# Patient Record
Sex: Female | Born: 1960 | Race: Black or African American | Hispanic: No | Smoking: Former smoker
Health system: Southern US, Community
[De-identification: ages and names within clinical notes are randomized; demographics above are authoritative.]

## PROBLEM LIST (undated history)

## (undated) DIAGNOSIS — E669 Obesity, unspecified: Secondary | ICD-10-CM

## (undated) DIAGNOSIS — M2550 Pain in unspecified joint: Secondary | ICD-10-CM

## (undated) DIAGNOSIS — R7303 Prediabetes: Secondary | ICD-10-CM

## (undated) DIAGNOSIS — D219 Benign neoplasm of connective and other soft tissue, unspecified: Secondary | ICD-10-CM

## (undated) DIAGNOSIS — Z9289 Personal history of other medical treatment: Secondary | ICD-10-CM

## (undated) DIAGNOSIS — R0602 Shortness of breath: Secondary | ICD-10-CM

## (undated) DIAGNOSIS — E785 Hyperlipidemia, unspecified: Secondary | ICD-10-CM

## (undated) DIAGNOSIS — J3089 Other allergic rhinitis: Secondary | ICD-10-CM

## (undated) DIAGNOSIS — Z90710 Acquired absence of both cervix and uterus: Secondary | ICD-10-CM

## (undated) DIAGNOSIS — F419 Anxiety disorder, unspecified: Secondary | ICD-10-CM

## (undated) DIAGNOSIS — N950 Postmenopausal bleeding: Secondary | ICD-10-CM

## (undated) DIAGNOSIS — J302 Other seasonal allergic rhinitis: Secondary | ICD-10-CM

## (undated) DIAGNOSIS — R002 Palpitations: Secondary | ICD-10-CM

## (undated) DIAGNOSIS — I10 Essential (primary) hypertension: Secondary | ICD-10-CM

## (undated) HISTORY — DX: Other allergic rhinitis: J30.89

## (undated) HISTORY — DX: Prediabetes: R73.03

## (undated) HISTORY — DX: Obesity, unspecified: E66.9

## (undated) HISTORY — DX: Other seasonal allergic rhinitis: J30.2

## (undated) HISTORY — DX: Hyperlipidemia, unspecified: E78.5

## (undated) HISTORY — PX: WISDOM TOOTH EXTRACTION: SHX21

## (undated) HISTORY — DX: Personal history of other medical treatment: Z92.89

## (undated) HISTORY — DX: Pain in unspecified joint: M25.50

## (undated) HISTORY — DX: Anxiety disorder, unspecified: F41.9

## (undated) HISTORY — PX: TONSILLECTOMY: SUR1361

## (undated) HISTORY — PX: CARPAL TUNNEL RELEASE: SHX101

## (undated) HISTORY — DX: Essential (primary) hypertension: I10

## (undated) HISTORY — DX: Shortness of breath: R06.02

## (undated) HISTORY — DX: Acquired absence of both cervix and uterus: Z90.710

## (undated) HISTORY — PX: ADENOIDECTOMY: SUR15

---

## 1998-07-02 ENCOUNTER — Emergency Department (HOSPITAL_COMMUNITY): Admission: EM | Admit: 1998-07-02 | Discharge: 1998-07-03 | Payer: Self-pay | Admitting: Emergency Medicine

## 1998-07-03 ENCOUNTER — Encounter: Payer: Self-pay | Admitting: Emergency Medicine

## 2002-03-20 ENCOUNTER — Other Ambulatory Visit: Admission: RE | Admit: 2002-03-20 | Discharge: 2002-03-20 | Payer: Self-pay | Admitting: Obstetrics & Gynecology

## 2002-03-31 ENCOUNTER — Emergency Department (HOSPITAL_COMMUNITY): Admission: EM | Admit: 2002-03-31 | Discharge: 2002-04-01 | Payer: Self-pay | Admitting: *Deleted

## 2002-03-31 ENCOUNTER — Encounter: Payer: Self-pay | Admitting: *Deleted

## 2002-04-10 ENCOUNTER — Encounter: Payer: Self-pay | Admitting: Obstetrics and Gynecology

## 2002-04-10 ENCOUNTER — Encounter: Admission: RE | Admit: 2002-04-10 | Discharge: 2002-04-10 | Payer: Self-pay | Admitting: Obstetrics and Gynecology

## 2003-04-09 ENCOUNTER — Other Ambulatory Visit: Admission: RE | Admit: 2003-04-09 | Discharge: 2003-04-09 | Payer: Self-pay | Admitting: Obstetrics & Gynecology

## 2003-05-25 ENCOUNTER — Encounter: Admission: RE | Admit: 2003-05-25 | Discharge: 2003-05-25 | Payer: Self-pay | Admitting: Obstetrics and Gynecology

## 2004-02-03 ENCOUNTER — Emergency Department (HOSPITAL_COMMUNITY): Admission: EM | Admit: 2004-02-03 | Discharge: 2004-02-03 | Payer: Self-pay | Admitting: *Deleted

## 2004-02-05 ENCOUNTER — Emergency Department (HOSPITAL_COMMUNITY): Admission: EM | Admit: 2004-02-05 | Discharge: 2004-02-05 | Payer: Self-pay | Admitting: Emergency Medicine

## 2004-05-30 ENCOUNTER — Encounter: Admission: RE | Admit: 2004-05-30 | Discharge: 2004-05-30 | Payer: Self-pay | Admitting: Obstetrics and Gynecology

## 2005-03-17 ENCOUNTER — Emergency Department (HOSPITAL_COMMUNITY): Admission: EM | Admit: 2005-03-17 | Discharge: 2005-03-17 | Payer: Self-pay | Admitting: Emergency Medicine

## 2005-06-12 ENCOUNTER — Encounter: Admission: RE | Admit: 2005-06-12 | Discharge: 2005-06-12 | Payer: Self-pay | Admitting: Obstetrics and Gynecology

## 2005-08-08 ENCOUNTER — Ambulatory Visit (HOSPITAL_COMMUNITY): Admission: RE | Admit: 2005-08-08 | Discharge: 2005-08-08 | Payer: Self-pay | Admitting: Obstetrics and Gynecology

## 2006-03-13 ENCOUNTER — Emergency Department (HOSPITAL_COMMUNITY): Admission: EM | Admit: 2006-03-13 | Discharge: 2006-03-13 | Payer: Self-pay | Admitting: Emergency Medicine

## 2006-06-14 ENCOUNTER — Encounter: Admission: RE | Admit: 2006-06-14 | Discharge: 2006-06-14 | Payer: Self-pay | Admitting: Obstetrics and Gynecology

## 2006-12-22 ENCOUNTER — Emergency Department (HOSPITAL_COMMUNITY): Admission: EM | Admit: 2006-12-22 | Discharge: 2006-12-23 | Payer: Self-pay | Admitting: Emergency Medicine

## 2007-02-12 ENCOUNTER — Encounter: Admission: RE | Admit: 2007-02-12 | Discharge: 2007-02-12 | Payer: Self-pay | Admitting: Internal Medicine

## 2007-02-14 ENCOUNTER — Emergency Department (HOSPITAL_COMMUNITY): Admission: EM | Admit: 2007-02-14 | Discharge: 2007-02-14 | Payer: Self-pay | Admitting: *Deleted

## 2007-02-19 ENCOUNTER — Encounter (INDEPENDENT_AMBULATORY_CARE_PROVIDER_SITE_OTHER): Payer: Self-pay | Admitting: Interventional Radiology

## 2007-02-19 ENCOUNTER — Encounter: Admission: RE | Admit: 2007-02-19 | Discharge: 2007-02-19 | Payer: Self-pay | Admitting: Internal Medicine

## 2007-02-19 ENCOUNTER — Other Ambulatory Visit: Admission: RE | Admit: 2007-02-19 | Discharge: 2007-02-19 | Payer: Self-pay | Admitting: Interventional Radiology

## 2007-07-08 ENCOUNTER — Encounter: Admission: RE | Admit: 2007-07-08 | Discharge: 2007-07-08 | Payer: Self-pay | Admitting: Obstetrics and Gynecology

## 2007-08-19 ENCOUNTER — Encounter: Admission: RE | Admit: 2007-08-19 | Discharge: 2007-08-19 | Payer: Self-pay | Admitting: Internal Medicine

## 2008-08-17 ENCOUNTER — Encounter: Admission: RE | Admit: 2008-08-17 | Discharge: 2008-08-17 | Payer: Self-pay | Admitting: Obstetrics and Gynecology

## 2009-08-24 ENCOUNTER — Encounter: Admission: RE | Admit: 2009-08-24 | Discharge: 2009-08-24 | Payer: Self-pay | Admitting: Obstetrics and Gynecology

## 2010-07-25 ENCOUNTER — Other Ambulatory Visit: Payer: Self-pay | Admitting: Obstetrics and Gynecology

## 2010-07-25 DIAGNOSIS — Z1231 Encounter for screening mammogram for malignant neoplasm of breast: Secondary | ICD-10-CM

## 2010-08-28 ENCOUNTER — Other Ambulatory Visit: Payer: Self-pay | Admitting: Obstetrics & Gynecology

## 2010-08-28 ENCOUNTER — Ambulatory Visit
Admission: RE | Admit: 2010-08-28 | Discharge: 2010-08-28 | Disposition: A | Discharge status: Home / Self Care | Payer: BC Managed Care – PPO | Source: Ambulatory Visit | Attending: Obstetrics and Gynecology | Admitting: Obstetrics and Gynecology

## 2010-08-28 DIAGNOSIS — Z1231 Encounter for screening mammogram for malignant neoplasm of breast: Secondary | ICD-10-CM

## 2011-02-22 LAB — TSH: TSH: 0.787

## 2011-02-22 LAB — BASIC METABOLIC PANEL
BUN: 10
CO2: 26
Calcium: 9.1
Chloride: 106
Creatinine, Ser: 0.88
GFR calc Af Amer: >60
GFR calc non Af Amer: >60
Glucose, Bld: 118 — ABNORMAL HIGH
Potassium: 4.5
Sodium: 140

## 2011-02-22 LAB — CBC
HCT: 39.1
Hemoglobin: 12.8
MCHC: 32.6
MCV: 85.3
Platelets: 327
RBC: 4.59
RDW: 13.8
WBC: 7

## 2011-02-22 LAB — T4, FREE: Free T4: 1.58

## 2011-02-26 LAB — CBC
HCT: 39.5
Hemoglobin: 13.1
MCHC: 33.2
MCV: 85.3
Platelets: 314
RBC: 4.62
RDW: 14.7 — ABNORMAL HIGH
WBC: 7.7

## 2011-02-26 LAB — URINE MICROSCOPIC-ADD ON

## 2011-02-26 LAB — POCT PREGNANCY, URINE
Operator id: 277751
Preg Test, Ur: NEGATIVE

## 2011-02-26 LAB — POCT I-STAT CREATININE
Creatinine, Ser: 1
Operator id: 277751

## 2011-02-26 LAB — I-STAT 8, (EC8 V) (CONVERTED LAB)
BUN: 13
Bicarbonate: 26.9 — ABNORMAL HIGH
Chloride: 105
Glucose, Bld: 106 — ABNORMAL HIGH
HCT: 43
Hemoglobin: 14.6
Operator id: 277751
Potassium: 4.1
Sodium: 141
TCO2: 28
pCO2, Ven: 51.9 — ABNORMAL HIGH
pH, Ven: 7.323 — ABNORMAL HIGH

## 2011-02-26 LAB — DIFFERENTIAL
Basophils Absolute: 0
Basophils Relative: 0
Eosinophils Absolute: 0.3
Eosinophils Relative: 4
Lymphocytes Relative: 26
Lymphs Abs: 2
Monocytes Absolute: 0.9 — ABNORMAL HIGH
Monocytes Relative: 11
Neutro Abs: 4.5
Neutrophils Relative %: 58

## 2011-02-26 LAB — URINALYSIS, ROUTINE W REFLEX MICROSCOPIC
Bilirubin Urine: NEGATIVE
Glucose, UA: NEGATIVE
Ketones, ur: 15 — AB
Nitrite: NEGATIVE
Protein, ur: 100 — AB
Specific Gravity, Urine: 1.033 — ABNORMAL HIGH
Urobilinogen, UA: 1
pH: 6.5

## 2011-02-26 LAB — GC/CHLAMYDIA PROBE AMP, GENITAL
Chlamydia, DNA Probe: NEGATIVE
GC Probe Amp, Genital: NEGATIVE

## 2011-02-26 LAB — PREGNANCY, URINE: Preg Test, Ur: NEGATIVE

## 2011-03-18 ENCOUNTER — Emergency Department (HOSPITAL_COMMUNITY)
Admission: EM | Admit: 2011-03-18 | Discharge: 2011-03-18 | Disposition: A | Discharge status: Home / Self Care | Payer: Self-pay | Attending: Emergency Medicine | Admitting: Emergency Medicine

## 2011-03-18 ENCOUNTER — Encounter: Payer: Self-pay | Admitting: *Deleted

## 2011-03-18 ENCOUNTER — Other Ambulatory Visit: Payer: Self-pay

## 2011-03-18 DIAGNOSIS — R002 Palpitations: Secondary | ICD-10-CM | POA: Insufficient documentation

## 2011-03-18 DIAGNOSIS — F411 Generalized anxiety disorder: Secondary | ICD-10-CM | POA: Insufficient documentation

## 2011-03-18 HISTORY — DX: Palpitations: R00.2

## 2011-03-18 MED ORDER — METOPROLOL TARTRATE 25 MG PO TABS: 25.0000 mg | ORAL_TABLET | Freq: Two times a day (BID) | ORAL | Status: DC

## 2011-03-18 NOTE — ED Provider Notes (Signed)
History     CSN: [id number] Arrival date & time: 03/18/2011  7:12 AM   First MD Initiated Contact with Patient 03/18/11 0719      Chief Complaint  Patient presents with  . Palpitations    (Consider location/radiation/quality/duration/timing/severity/associated sxs/prior treatment) Patient is a 50 y.o. female presenting with palpitations.  Palpitations  Pertinent negatives include no diaphoresis, no numbness, no abdominal pain, no headaches, no back pain, no cough and no shortness of breath.   the patient is a 50 year old, female with a history of palpitations in the past.  She complains of increased palpitations and anxiety.  For the past few days.  She states that she has been eating increased amounts of chocolate recently.  She has tried to take an extra dose of her anxiolytic, but did not get any relief.  She was told that she could take an increased dose of her Toprol however, she has not done that.  She denies any other symptoms.  She denies specifically, nausea, vomiting, diarrhea, cough, fevers, chills.  She denies pain anywhere.  She does not drink caffeinated beverages.  She does not smoke.  The palpitations come and go and are frequent.  She denies lightheadedness with the palpitations.  She has a appointment with her cardiologist on November 27 concerning the symptoms.  Past Medical History  Diagnosis Date  . Palpitations     Past Surgical History  Procedure Date  . Carpal tunnel release     Family History  Problem Relation Age of Onset  . Diabetes Mother   . Coronary artery disease Father     History  Substance Use Topics  . Smoking status: Never Smoker   . Smokeless tobacco: Not on file  . Alcohol Use: No    OB History    Grav Para Term Preterm Abortions TAB SAB Ect Mult Living                  Review of Systems  Constitutional: Negative for chills and diaphoresis.  HENT: Negative for congestion and neck pain.   Eyes: Negative for redness.    Respiratory: Negative for cough, chest tightness and shortness of breath.   Cardiovascular: Positive for palpitations.  Gastrointestinal: Negative for abdominal pain.  Genitourinary: Negative for dysuria.  Musculoskeletal: Negative for back pain.  Skin: Negative for rash.  Neurological: Negative for light-headedness, numbness and headaches.  Psychiatric/Behavioral: Negative for confusion.    Allergies  Review of patient's allergies indicates not on file.    No current outpatient prescriptions on file.  BP 142/90  Pulse 103  Temp(Src) 98.8 F (37.1 C) (Oral)  Resp 18  Ht 5\' 3"  (1.6 m)  Wt 193 lb (87.544 kg)  BMI 34.19 kg/m2  SpO2 100%  Physical Exam  Constitutional: She is oriented to person, place, and time. She appears well-developed and well-nourished.  HENT:  Head: Normocephalic and atraumatic.  Eyes: Pupils are equal, round, and reactive to light.  Neck: Normal range of motion.  Cardiovascular: Normal rate, regular rhythm and normal heart sounds.   No murmur heard. Pulmonary/Chest: Effort normal and breath sounds normal. No respiratory distress. She has no wheezes. She has no rales.  Abdominal: Soft. She exhibits no distension and no mass. There is no tenderness. There is no rebound and no guarding.  Musculoskeletal: Normal range of motion. She exhibits no edema and no tenderness.  Neurological: She is alert and oriented to person, place, and time. No cranial nerve deficit.  Skin: Skin is warm and  dry. No rash noted. No erythema.  Psychiatric: She has a normal mood and affect. Her behavior is normal.    ED Course  Procedures (including critical care time)  Labs Reviewed - No data to display No results found.   No diagnosis found.  `ED ECG REPORT   Date: 03/18/2011  EKG Time: 08:05  Rate: 101  Rhythm: sinus tachycardia,    Axis: normal  Intervals:none  ST&T Change: none  Narrative Interpretation: sinus tachycardia            MDM   Sinus  tachycardia.  The heart rate varies from the 80s to 100 conduct her interview and evaluation.  palpitations no significant illness the patient is in no distress.   She knows that she can take an increased dosage of Toprol.  There is no indication for further testing in the emergency department.       Nicholes Stairs, MD 03/18/11 [phone]

## 2011-03-18 NOTE — ED Notes (Signed)
C/o palpitations. H/o same. Has had more chocolate recently. Feels nervous. Takes toprol PTA for the same and took xanax PTA for nervousness. Denies other sx.

## 2011-07-23 ENCOUNTER — Other Ambulatory Visit: Payer: Self-pay | Admitting: Obstetrics & Gynecology

## 2011-07-23 DIAGNOSIS — Z1231 Encounter for screening mammogram for malignant neoplasm of breast: Secondary | ICD-10-CM

## 2011-07-29 ENCOUNTER — Other Ambulatory Visit: Payer: Self-pay

## 2011-07-29 ENCOUNTER — Encounter (HOSPITAL_COMMUNITY): Payer: Self-pay | Admitting: *Deleted

## 2011-07-29 ENCOUNTER — Emergency Department (HOSPITAL_COMMUNITY)
Admission: EM | Admit: 2011-07-29 | Discharge: 2011-07-30 | Disposition: A | Discharge status: Home / Self Care | Payer: BC Managed Care – PPO | Attending: Emergency Medicine | Admitting: Emergency Medicine

## 2011-07-29 DIAGNOSIS — F419 Anxiety disorder, unspecified: Secondary | ICD-10-CM

## 2011-07-29 DIAGNOSIS — R450 Nervousness: Secondary | ICD-10-CM | POA: Insufficient documentation

## 2011-07-29 DIAGNOSIS — F411 Generalized anxiety disorder: Secondary | ICD-10-CM | POA: Insufficient documentation

## 2011-07-29 LAB — URINALYSIS, ROUTINE W REFLEX MICROSCOPIC
Bilirubin Urine: NEGATIVE
Glucose, UA: NEGATIVE mg/dL
Hgb urine dipstick: NEGATIVE
Ketones, ur: NEGATIVE mg/dL
Nitrite: NEGATIVE
Protein, ur: NEGATIVE mg/dL
Specific Gravity, Urine: 1.004 — ABNORMAL LOW (ref 1.005–1.030)
Urobilinogen, UA: 0.2 mg/dL (ref 0.0–1.0)
pH: 6 (ref 5.0–8.0)

## 2011-07-29 LAB — POCT I-STAT, CHEM 8
BUN: 17 mg/dL (ref 6–23)
Calcium, Ion: 1.24 mmol/L (ref 1.12–1.32)
Chloride: 105 mEq/L (ref 96–112)
Creatinine, Ser: 0.8 mg/dL (ref 0.50–1.10)
Glucose, Bld: 103 mg/dL — ABNORMAL HIGH (ref 70–99)
HCT: 42 % (ref 36.0–46.0)
Hemoglobin: 14.3 g/dL (ref 12.0–15.0)
Potassium: 4 mEq/L (ref 3.5–5.1)
Sodium: 142 mEq/L (ref 135–145)
TCO2: 26 mmol/L (ref 0–100)

## 2011-07-29 LAB — URINE MICROSCOPIC-ADD ON

## 2011-07-29 LAB — TROPONIN I: Troponin I: <0.3 ng/mL (ref <0.30)

## 2011-07-29 LAB — POCT PREGNANCY, URINE: Preg Test, Ur: NEGATIVE

## 2011-07-29 MED ORDER — LORAZEPAM 1 MG PO TABS
1.0000 mg | ORAL_TABLET | Freq: Once | ORAL | Status: AC
  Administered 2011-07-29: 1 mg via ORAL
  Filled 2011-07-29: qty 1

## 2011-07-29 NOTE — ED Provider Notes (Cosign Needed)
History     CSN: [id number]  Arrival date & time 07/29/11  2004   First MD Initiated Contact with Patient 07/29/11 2142      Chief Complaint  Patient presents with  . Anxiety     Patient is a 51 y.o. female presenting with anxiety. The history is provided by the patient.  Anxiety The current episode started in the past 7 days. The problem occurs intermittently. The problem has been gradually worsening. Pertinent negatives include no chest pain, nausea, numbness or weakness. The symptoms are aggravated by stress. She has tried nothing for the symptoms. The treatment provided no relief.  Anxiety The current episode started in the past 7 days. The problem occurs intermittently. The problem has been gradually worsening. Pertinent negatives include no chest pain. The symptoms are aggravated by stress. She has tried nothing for the symptoms. The treatment provided no relief.   patient reports a several day history of increasing "nervousness" and occasional palpitations. States that she had a complete physical just 2 days ago including EKG and lab work and was told by her physician that everything looks fine. Her PCP did give her prescription for Xanax and Cymbalta but states she does not want to take the Cymbalta and the Xanax seems to help very little. States this nervousness has persisted. States that she is normally a "happy go lucky"person so the symptoms have been surprised her. Admits that she is under a great deal of stress as she is working 2 jobs and raising children. Denies actual chest pain, shortness of breath, cough, fever or any other associated symptoms.   Past Medical History  Diagnosis Date  . Palpitations     Past Surgical History  Procedure Date  . Carpal tunnel release     Family History  Problem Relation Age of Onset  . Diabetes Mother   . Coronary artery disease Father     History  Substance Use Topics  . Smoking status: Never Smoker   . Smokeless tobacco: Not  on file  . Alcohol Use: No    OB History    Grav Para Term Preterm Abortions TAB SAB Ect Mult Living                  Review of Systems  Constitutional: Negative.   HENT: Negative.   Eyes: Negative.   Respiratory: Negative.   Cardiovascular: Negative.  Negative for chest pain.  Gastrointestinal: Negative.  Negative for nausea.  Genitourinary: Negative.   Musculoskeletal: Negative.   Skin: Negative.   Neurological: Negative.  Negative for weakness and numbness.  Hematological: Negative.   Psychiatric/Behavioral: Negative.     Allergies  Review of patient's allergies indicates no known allergies.  Home Medications   Current Outpatient Rx  Name Route Sig Dispense Refill  . ALPRAZOLAM 0.5 MG PO TABS Oral Take 0.5 mg by mouth at bedtime as needed. For sleep    . CALCIUM + D PO Oral Take 1 tablet by mouth daily.    . COD LIVER OIL PO Oral Take 1 capsule by mouth daily.    Marland Kitchen VITAMIN B-12 PO Oral Take 1 tablet by mouth daily.    Marland Kitchen METOPROLOL TARTRATE 25 MG PO TABS Oral Take 25 mg by mouth daily.    Carma Leaven M PLUS PO TABS Oral Take 1 tablet by mouth daily.      BP 176/94  Temp(Src) 98.2 F (36.8 C) (Oral)  Resp 22  SpO2 100%  Physical Exam  Constitutional: She  is oriented to person, place, and time. She appears well-developed and well-nourished.  HENT:  Head: Normocephalic and atraumatic.  Eyes: Conjunctivae are normal.  Neck: Neck supple.  Cardiovascular: Normal rate and regular rhythm.   Pulmonary/Chest: Effort normal and breath sounds normal.  Abdominal: Soft. Bowel sounds are normal.  Musculoskeletal: Normal range of motion.  Neurological: She is alert and oriented to person, place, and time.  Skin: Skin is warm and dry. No erythema.  Psychiatric: She has a normal mood and affect.    ED Course  Procedures   Date: 07/30/2011  Rate: 112  Rhythm: sinus tachycardia  QRS Axis: normal  Intervals: normal  ST/T Wave abnormalities: nonspecific ST/T changes   Conduction Disutrbances:none  Narrative Interpretation:   Old EKG Reviewed: changes noted    No significant changes noted when compared with prior ECG. The patient agrees she is feeling some better after PO Ativan. Findings and clinical impression discussed with patient. We will plan for discharge home with short course of PO Ativan and encourage close followup with her primary care physician if symptoms persist and/or if she believes the Ativan works better for her than the Xanax.  Labs Reviewed  URINALYSIS, ROUTINE W REFLEX MICROSCOPIC - Abnormal; Notable for the following:    Specific Gravity, Urine 1.004 (*)    Leukocytes, UA TRACE (*)    All other components within normal limits  POCT I-STAT, CHEM 8 - Abnormal; Notable for the following:    Glucose, Bld 103 (*)    All other components within normal limits  POCT PREGNANCY, URINE  URINE MICROSCOPIC-ADD ON  TROPONIN I   Results for orders placed during the hospital encounter of 07/29/11  TROPONIN I      Component Value Range   Troponin I <0.30  <0.30 (ng/mL)  URINALYSIS, ROUTINE W REFLEX MICROSCOPIC      Component Value Range   Color, Urine YELLOW  YELLOW    APPearance CLEAR  CLEAR    Specific Gravity, Urine 1.004 (*) 1.005 - 1.030    pH 6.0  5.0 - 8.0    Glucose, UA NEGATIVE  NEGATIVE (mg/dL)   Hgb urine dipstick NEGATIVE  NEGATIVE    Bilirubin Urine NEGATIVE  NEGATIVE    Ketones, ur NEGATIVE  NEGATIVE (mg/dL)   Protein, ur NEGATIVE  NEGATIVE (mg/dL)   Urobilinogen, UA 0.2  0.0 - 1.0 (mg/dL)   Nitrite NEGATIVE  NEGATIVE    Leukocytes, UA TRACE (*) NEGATIVE   POCT I-STAT, CHEM 8      Component Value Range   Sodium 142  135 - 145 (mEq/L)   Potassium 4.0  3.5 - 5.1 (mEq/L)   Chloride 105  96 - 112 (mEq/L)   BUN 17  6 - 23 (mg/dL)   Creatinine, Ser 1.61  0.50 - 1.10 (mg/dL)   Glucose, Bld 096 (*) 70 - 99 (mg/dL)   Calcium, Ion 0.45  4.09 - 1.32 (mmol/L)   TCO2 26  0 - 100 (mmol/L)   Hemoglobin 14.3  12.0 - 15.0 (g/dL)     HCT 81.1  91.4 - 78.2 (%)  POCT PREGNANCY, URINE      Component Value Range   Preg Test, Ur NEGATIVE  NEGATIVE   URINE MICROSCOPIC-ADD ON      Component Value Range   Squamous Epithelial / LPF RARE  RARE    WBC, UA 0-2  <3 (WBC/hpf)   RBC / HPF 0-2  <3 (RBC/hpf)   No results found.  1. Anxiety       MDM  HPI/PE and clinical findings c/w 1. Anxiety (EKG without acute findings, patient denies chest pain or shortness of breath, describes the symptoms as "nervousness inside", cardiac source of pain considered but not likely).     Medical screening examination/treatment/procedure(s) were performed by non-physician practitioner and as supervising physician I was immediately available for consultation/collaboration. Osvaldo Human, M.D.    Mena Pauls Schorr, NP 07/30/11 0013  Roma Kayser Schorr, NP 07/30/11 0015  Roma Kayser Schorr, NP 07/30/11 [id number]  Carleene Cooper III, MD 07/31/11 [phone]

## 2011-07-29 NOTE — ED Notes (Signed)
The pt has had nervousness and an irregular heart beat  For one week.  She had a physical with her doctor Tuesday and everything on her labs was ok

## 2011-07-30 MED ORDER — LORAZEPAM 1 MG PO TABS: 1.0000 mg | ORAL_TABLET | Freq: Three times a day (TID) | ORAL | Status: AC | PRN

## 2011-07-30 NOTE — Discharge Instructions (Signed)
  Please review the instructions below. Your EKG and lab work tonight were normal. The source of your symptoms over the last several days is likely anxiety. We have prescribed a short course of the medication you had here tonight. Take as directed when you need it. If your symptoms persist you will need to arrange follow up with your primary care physician for further evaluation. Return if your symptoms worsen.    Anxiety and Panic Attacks Anxiety is your body's way of reacting to real danger or something you think is a danger. It may be fear or worry over a situation like losing your job. Sometimes the cause is not known. A panic attack is made up of physical signs like sweating, shaking, or chest pain. Anxiety and panic attacks may start suddenly. They may be strong. They may come at any time of day, even while sleeping. They may come at any time of life. Panic attacks are scary, but they do not harm you physically.  HOME CARE  Avoid any known causes of your anxiety.   Try to relax. Yoga may help. Tell yourself everything will be okay.   Exercise often.   Get expert advice and help (therapy) to stop anxiety or attacks from happening.   Avoid caffeine, alcohol, and drugs.   Only take medicine as told by your doctor.  GET HELP RIGHT AWAY IF:  Your attacks seem different than normal attacks.   Your problems are getting worse or concern you.  MAKE SURE YOU:  Understand these instructions.   Will watch your condition.   Will get help right away if you are not doing well or get worse.  Document Released: 06/02/2010 Document Revised: 04/19/2011 Document Reviewed: 06/02/2010 [facility] Patient Information 2012 [place], [place].

## 2011-09-03 ENCOUNTER — Ambulatory Visit
Admission: RE | Admit: 2011-09-03 | Discharge: 2011-09-03 | Disposition: A | Discharge status: Home / Self Care | Payer: BC Managed Care – PPO | Source: Ambulatory Visit | Attending: Obstetrics & Gynecology | Admitting: Obstetrics & Gynecology

## 2011-09-03 DIAGNOSIS — Z1231 Encounter for screening mammogram for malignant neoplasm of breast: Secondary | ICD-10-CM

## 2011-09-21 ENCOUNTER — Emergency Department (INDEPENDENT_AMBULATORY_CARE_PROVIDER_SITE_OTHER)
Admission: EM | Admit: 2011-09-21 | Discharge: 2011-09-21 | Disposition: A | Discharge status: Home / Self Care | Payer: BC Managed Care – PPO | Source: Home / Self Care | Attending: Emergency Medicine | Admitting: Emergency Medicine

## 2011-09-21 ENCOUNTER — Encounter (HOSPITAL_COMMUNITY): Payer: Self-pay | Admitting: Emergency Medicine

## 2011-09-21 DIAGNOSIS — S335XXA Sprain of ligaments of lumbar spine, initial encounter: Secondary | ICD-10-CM

## 2011-09-21 MED ORDER — IBUPROFEN 800 MG PO TABS
ORAL_TABLET | ORAL | Status: AC
  Filled 2011-09-21: qty 1

## 2011-09-21 MED ORDER — CYCLOBENZAPRINE HCL 10 MG PO TABS: 10.0000 mg | ORAL_TABLET | Freq: Two times a day (BID) | ORAL | Status: AC | PRN

## 2011-09-21 MED ORDER — IBUPROFEN 800 MG PO TABS
800.0000 mg | ORAL_TABLET | Freq: Once | ORAL | Status: AC
  Administered 2011-09-21: 800 mg via ORAL

## 2011-09-21 MED ORDER — HYDROCODONE-IBUPROFEN 7.5-200 MG PO TABS: 1.0000 | ORAL_TABLET | Freq: Four times a day (QID) | ORAL | Status: AC | PRN

## 2011-09-21 NOTE — ED Notes (Deleted)
PT HERE WITH VAG WHITE D/C WITH ODOR THAT STARTED X 4 DYS AGO.DENIES ABD/BACK PAIN.VSS.LMP 4/25.PT ALSO C/O RADIATING EAR ACHE

## 2011-09-21 NOTE — Discharge Instructions (Signed)
If pain and discomfort persists beyond 4-6 weeks followup with the orthopedic Dr. As discussed   Back Exercises Back exercises help treat and prevent back injuries. The goal of back exercises is to increase the strength of your abdominal and back muscles and the flexibility of your back. These exercises should be started when you no longer have back pain. Back exercises include:  Pelvic Tilt. Lie on your back with your knees bent. Tilt your pelvis until the lower part of your back is against the floor. Hold this position 5 to 10 sec and repeat 5 to 10 times.   Knee to Chest. Pull first 1 knee up against your chest and hold for 20 to 30 seconds, repeat this with the other knee, and then both knees. This may be done with the other leg straight or bent, whichever feels better.   Sit-Ups or Curl-Ups. Bend your knees 90 degrees. Start with tilting your pelvis, and do a partial, slow sit-up, lifting your trunk only 30 to 45 degrees off the floor. Take at least 2 to 3 seconds for each sit-up. Do not do sit-ups with your knees out straight. If partial sit-ups are difficult, simply do the above but with only tightening your abdominal muscles and holding it as directed.   Hip-Lift. Lie on your back with your knees flexed 90 degrees. Push down with your feet and shoulders as you raise your hips a couple inches off the floor; hold for 10 seconds, repeat 5 to 10 times.   Back arches. Lie on your stomach, propping yourself up on bent elbows. Slowly press on your hands, causing an arch in your low back. Repeat 3 to 5 times. Any initial stiffness and discomfort should lessen with repetition over time.   Shoulder-Lifts. Lie face down with arms beside your body. Keep hips and torso pressed to floor as you slowly lift your head and shoulders off the floor.  Do not overdo your exercises, especially in the beginning. Exercises may cause you some mild back discomfort which lasts for a few minutes; however, if the pain is  more severe, or lasts for more than 15 minutes, do not continue exercises until you see your caregiver. Improvement with exercise therapy for back problems is slow.  See your caregivers for assistance with developing a proper back exercise program. Document Released: 06/07/2004 Document Revised: 04/19/2011 Document Reviewed: 04/30/2005 [facility] Patient Information 2012 [place], [place].Back Pain, Adult Low back pain is very common. About 1 in 5 people have back pain.The cause of low back pain is rarely dangerous. The pain often gets better over time.About half of people with a sudden onset of back pain feel better in just 2 weeks. About 8 in 10 people feel better by 6 weeks.  CAUSES Some common causes of back pain include:  Strain of the muscles or ligaments supporting the spine.   Wear and tear (degeneration) of the spinal discs.   Arthritis.   Direct injury to the back.  DIAGNOSIS Most of the time, the direct cause of low back pain is not known.However, back pain can be treated effectively even when the exact cause of the pain is unknown.Answering your caregiver's questions about your overall health and symptoms is one of the most accurate ways to make sure the cause of your pain is not dangerous. If your caregiver needs more information, he or she may order lab work or imaging tests (X-rays or MRIs).However, even if imaging tests show changes in your back, this usually does  not require surgery. HOME CARE INSTRUCTIONS For many people, back pain returns.Since low back pain is rarely dangerous, it is often a condition that people can learn to [facility] their own.   Remain active. It is stressful on the back to sit or stand in one place. Do not sit, drive, or stand in one place for more than 30 minutes at a time. Take short walks on level surfaces as soon as pain allows.Try to increase the length of time you walk each day.   Do not stay in bed.Resting more than 1 or 2 days can delay your  recovery.   Do not avoid exercise or work.Your body is made to move.It is not dangerous to be active, even though your back may hurt.Your back will likely heal faster if you return to being active before your pain is gone.   Pay attention to your body when you bend and lift. Many people have less discomfortwhen lifting if they bend their knees, keep the load close to their bodies,and avoid twisting. Often, the most comfortable positions are those that put less stress on your recovering back.   Find a comfortable position to sleep. Use a firm mattress and lie on your side with your knees slightly bent. If you lie on your back, put a pillow under your knees.   Only take over-the-counter or prescription medicines as directed by your caregiver. Over-the-counter medicines to reduce pain and inflammation are often the most helpful.Your caregiver may prescribe muscle relaxant drugs.These medicines help dull your pain so you can more quickly return to your normal activities and healthy exercise.   Put ice on the injured area.   Put ice in a plastic bag.   Place a towel between your skin and the bag.   Leave the ice on for 15 to 20 minutes, 3 to 4 times a day for the first 2 to 3 days. After that, ice and heat may be alternated to reduce pain and spasms.   Ask your caregiver about trying back exercises and gentle massage. This may be of some benefit.   Avoid feeling anxious or stressed.Stress increases muscle tension and can worsen back pain.It is important to recognize when you are anxious or stressed and learn ways to manage it.Exercise is a great option.  SEEK MEDICAL CARE IF:  You have pain that is not relieved with rest or medicine.   You have pain that does not improve in 1 week.   You have new symptoms.   You are generally not feeling well.  SEEK IMMEDIATE MEDICAL CARE IF:   You have pain that radiates from your back into your legs.   You develop new bowel or bladder  control problems.   You have unusual weakness or numbness in your arms or legs.   You develop nausea or vomiting.   You develop abdominal pain.   You feel faint.  Document Released: 04/30/2005 Document Revised: 04/19/2011 Document Reviewed: 09/18/2010 [facility] Patient Information 2012 [place], [place].

## 2011-09-21 NOTE — ED Provider Notes (Addendum)
History     CSN: [id number]  Arrival date & time 09/21/11  4098   First MD Initiated Contact with Patient 09/21/11 [phone]      Chief Complaint  Patient presents with  . Back Pain    (Consider location/radiation/quality/duration/timing/severity/associated sxs/prior treatment) HPI Comments: Patient presents of ongoing back pains for about 2 weeks but has more recently exacerbated she has been walking and necrotizing abdomen different activities that she is not normally accustomed to. She describes it as being feeling sore in her lower back and that yesterday she went to pick up something from the floor status of feeling more pain mainly on the right side.  Patient is a 51 y.o. female presenting with back pain. The history is provided by the patient.  Back Pain  This is a new problem. The current episode started more than 1 week ago. The problem occurs constantly. The problem has not changed since onset.The pain is associated with lifting heavy objects and twisting. The pain is moderate. The symptoms are aggravated by bending, twisting and certain positions. Pertinent negatives include no fever, no numbness, no weight loss, no bowel incontinence, no perianal numbness, no bladder incontinence, no dysuria, no paresthesias, no paresis, no tingling and no weakness. She has tried NSAIDs for the symptoms. The treatment provided no relief.    Past Medical History  Diagnosis Date  . Palpitations     Past Surgical History  Procedure Date  . Carpal tunnel release     Family History  Problem Relation Age of Onset  . Diabetes Mother   . Coronary artery disease Father     History  Substance Use Topics  . Smoking status: Never Smoker   . Smokeless tobacco: Not on file  . Alcohol Use: No    OB History    Grav Para Term Preterm Abortions TAB SAB Ect Mult Living                  Review of Systems  Constitutional: Negative for fever and weight loss.  Respiratory: Negative for chest  tightness.   Gastrointestinal: Negative for bowel incontinence.  Genitourinary: Negative for bladder incontinence, dysuria, hematuria and genital sores.  Musculoskeletal: Positive for back pain. Negative for joint swelling, arthralgias and gait problem.  Neurological: Negative for tingling, weakness, numbness and paresthesias.    Allergies  Review of patient's allergies indicates no known allergies.  Home Medications   Current Outpatient Rx  Name Route Sig Dispense Refill  . ALPRAZOLAM 0.5 MG PO TABS Oral Take 0.5 mg by mouth at bedtime as needed. For sleep    . CALCIUM + D PO Oral Take 1 tablet by mouth daily.    . COD LIVER OIL PO Oral Take 1 capsule by mouth daily.    Marland Kitchen VITAMIN B-12 PO Oral Take 1 tablet by mouth daily.    . CYCLOBENZAPRINE HCL 10 MG PO TABS Oral Take 1 tablet (10 mg total) by mouth 2 (two) times daily as needed for muscle spasms. 20 tablet 0  . HYDROCODONE-IBUPROFEN 7.5-200 MG PO TABS Oral Take 1 tablet by mouth every 6 (six) hours as needed for pain. 30 tablet 0  . METOPROLOL TARTRATE 25 MG PO TABS Oral Take 25 mg by mouth daily.    Carma Leaven M PLUS PO TABS Oral Take 1 tablet by mouth daily.      BP 153/86  Pulse 92  Temp(Src) 98.5 F (36.9 C) (Oral)  Resp 20  SpO2 100%  Physical Exam  Nursing note and vitals reviewed. Constitutional: She is oriented to person, place, and time. She appears well-developed and well-nourished.  Eyes: Conjunctivae are normal.  Neck: Neck supple.  Cardiovascular:  No murmur heard. Pulmonary/Chest: No respiratory distress. She has no wheezes. She has no rales. She exhibits no tenderness.  Abdominal: She exhibits no distension. There is no tenderness.  Musculoskeletal: She exhibits tenderness. She exhibits no edema.       Back:  Neurological: She is oriented to person, place, and time. She displays normal reflexes. She exhibits normal muscle tone.  Skin: No rash noted. No erythema.    ED Course  Procedures (including  critical care time)  Labs Reviewed - No data to display No results found.   1. Lumbar back sprain       MDM   Patient with bilateral lumbar sprain and strains. Have encouraged patient to take prescribe medicines and back exercises information provided. Route treatment plan followup care with [name] or the orthopedic doctor pain was to persist beyond 2 weeks. No neurological deficits were noted on her exam.       Jimmie Molly, MD 09/21/11 1553  Jimmie Molly, MD 09/21/11 1606

## 2011-09-21 NOTE — ED Notes (Signed)
PT HERE WITH SUDDE LOWER BACK PAIN THAT STARTED LAST WEEK AFTER EXERCISES AND MOVING FURNITURE RELIEVED BY OTC MOTRIN BUT STATES THE PAIN FLARED UP YESTERDAY WITH BENDING.SHARP,SHOOTING PAINS.

## 2011-12-26 ENCOUNTER — Encounter: Payer: Self-pay | Admitting: *Deleted

## 2011-12-26 ENCOUNTER — Encounter: Payer: BC Managed Care – PPO | Attending: Internal Medicine | Admitting: *Deleted

## 2011-12-26 DIAGNOSIS — Z713 Dietary counseling and surveillance: Secondary | ICD-10-CM | POA: Insufficient documentation

## 2011-12-26 DIAGNOSIS — E669 Obesity, unspecified: Secondary | ICD-10-CM | POA: Insufficient documentation

## 2011-12-26 NOTE — Progress Notes (Signed)
  Medical Nutrition Therapy:  Appt start time: 1145 end time:  1245.  Assessment:  Primary concerns today: patient here for assistance with obesity. 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 She lives with her 2 adult children in her home.   MEDICATIONS: see list   DIETARY INTAKE:  Usual eating pattern includes 3 meals and 2 snacks per day.  Everyday foods include foods convenient to eat, usually on way to work, etc.  Avoided foods include pork.    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   Nutritional Diagnosis:  NI-1.5 Excessive energy intake As related to activity level.  As evidenced by BMI of 33.4.    Intervention:  Nutrition counseling provided for weight management. Taught her the caloric value of macro-nutrients and started with Carb Counting. Reviewed common foods she orders in restaurants in terms of carb and fat grams. Also taught reading of food labels and value of increasing her activity level on a regular basis.   Handouts given during visit include: Carb Counting and Food  Label handouts Meal Plan Card Info on [employer] APP for phone  Monitoring/Evaluation:  Dietary intake, exercise, reading food labels, and body weight in 4 week(s).

## 2011-12-26 NOTE — Patient Instructions (Addendum)
Plan: Aim for 3 Carbs (45 grams) per meal +/- 1 either way Aim for 0-1 carbs per snack if hungry, add protein to avoid hunger Consider Consider Continue Continue

## 2012-01-21 ENCOUNTER — Encounter: Payer: BC Managed Care – PPO | Attending: Internal Medicine | Admitting: *Deleted

## 2012-01-21 DIAGNOSIS — E669 Obesity, unspecified: Secondary | ICD-10-CM | POA: Insufficient documentation

## 2012-01-21 DIAGNOSIS — Z713 Dietary counseling and surveillance: Secondary | ICD-10-CM | POA: Insufficient documentation

## 2012-01-21 NOTE — Progress Notes (Signed)
  Medical Nutrition Therapy:  Appt start time: 1730 end time:  1800.  Assessment:  Primary concerns today: patient here for obesity follow up visit. She is pleased with weight loss of 4 pounds in past 4 weeks. 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 She has had success with Carb Counting and been able to identify foods that have more carb than she realized. She has come up with alternative healthier choices for both meals and snacks.  MEDICATIONS: see list   DIETARY INTAKE:  Usual eating pattern includes 3 meals and 2 snacks per day.  Everyday foods include foods convenient to eat, usually on way to work, etc.  Avoided foods include pork.    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   Nutritional Diagnosis:  NI-1.5 Excessive energy intake As related to activity level.  As evidenced by BMI of 33.4 which is now down to 32.7    Intervention:  Commended her on her successes with better food choices, reading food labels and increased activity level. Added information on fat grams this visit, what to look for on food label and what the calorie content is with higher fat foods. Suggested she check with her MD  to see what exercises would be safe to initiate?  Plan: Continue to aim for 3 Carbs (45 grams) per meal +/- 1 either way Continue to aim for 0-1 carbs per snack if hungry, add protein to avoid hunger Consider that one serving of fat is 5 grams (45-50 calories), Aim for 2 servings per meal (10 grams) Consider limiting fried foods to 2 days a week, pick the days ahead of time (Tuesday and Friday) Check with MD regarding what exercises are safe for you to start.  Monitoring/Evaluation:  Dietary intake, exercise, reading food labels, and body weight in 4 week(s).

## 2012-01-21 NOTE — Patient Instructions (Signed)
Plan: Continue to aim for 3 Carbs (45 grams) per meal +/- 1 either way Continue to aim for 0-1 carbs per snack if hungry, add protein to avoid hunger Consider that one serving of fat is 5 grams (45-50 calories), Aim for 2 servings per meal (10 grams) Consider limiting fried foods to 2 days a week, pick the days ahead of time (Tuesday and Friday) Check with MD regarding what exercises are safe for you to start.

## 2012-02-13 ENCOUNTER — Emergency Department (HOSPITAL_COMMUNITY)
Admission: EM | Admit: 2012-02-13 | Discharge: 2012-02-13 | Disposition: A | Discharge status: Home / Self Care | Payer: BC Managed Care – PPO | Attending: Emergency Medicine | Admitting: Emergency Medicine

## 2012-02-13 ENCOUNTER — Encounter (HOSPITAL_COMMUNITY): Payer: Self-pay | Admitting: *Deleted

## 2012-02-13 DIAGNOSIS — F419 Anxiety disorder, unspecified: Secondary | ICD-10-CM

## 2012-02-13 DIAGNOSIS — R002 Palpitations: Secondary | ICD-10-CM

## 2012-02-13 DIAGNOSIS — F411 Generalized anxiety disorder: Secondary | ICD-10-CM | POA: Insufficient documentation

## 2012-02-13 DIAGNOSIS — E785 Hyperlipidemia, unspecified: Secondary | ICD-10-CM | POA: Insufficient documentation

## 2012-02-13 DIAGNOSIS — E669 Obesity, unspecified: Secondary | ICD-10-CM | POA: Insufficient documentation

## 2012-02-13 DIAGNOSIS — I10 Essential (primary) hypertension: Secondary | ICD-10-CM | POA: Insufficient documentation

## 2012-02-13 LAB — COMPREHENSIVE METABOLIC PANEL
ALT: 25 U/L (ref 0–35)
AST: 22 U/L (ref 0–37)
Albumin: 4 g/dL (ref 3.5–5.2)
Alkaline Phosphatase: 45 U/L (ref 39–117)
BUN: 17 mg/dL (ref 6–23)
CO2: 26 mEq/L (ref 19–32)
Calcium: 9.8 mg/dL (ref 8.4–10.5)
Chloride: 103 mEq/L (ref 96–112)
Creatinine, Ser: 0.91 mg/dL (ref 0.50–1.10)
GFR calc Af Amer: 84 mL/min — ABNORMAL LOW (ref >90)
GFR calc non Af Amer: 72 mL/min — ABNORMAL LOW (ref >90)
Glucose, Bld: 106 mg/dL — ABNORMAL HIGH (ref 70–99)
Potassium: 3.5 mEq/L (ref 3.5–5.1)
Sodium: 141 mEq/L (ref 135–145)
Total Bilirubin: 0.2 mg/dL — ABNORMAL LOW (ref 0.3–1.2)
Total Protein: 7.1 g/dL (ref 6.0–8.3)

## 2012-02-13 LAB — CBC WITH DIFFERENTIAL/PLATELET
Basophils Absolute: 0 10*3/uL (ref 0.0–0.1)
Basophils Relative: 0 % (ref 0–1)
Eosinophils Absolute: 0.2 10*3/uL (ref 0.0–0.7)
Eosinophils Relative: 3 % (ref 0–5)
HCT: 39.2 % (ref 36.0–46.0)
Hemoglobin: 12.9 g/dL (ref 12.0–15.0)
Lymphocytes Relative: 57 % — ABNORMAL HIGH (ref 12–46)
Lymphs Abs: 2.7 10*3/uL (ref 0.7–4.0)
MCH: 27 pg (ref 26.0–34.0)
MCHC: 32.9 g/dL (ref 30.0–36.0)
MCV: 82.2 fL (ref 78.0–100.0)
Monocytes Absolute: 0.7 10*3/uL (ref 0.1–1.0)
Monocytes Relative: 14 % — ABNORMAL HIGH (ref 3–12)
Neutro Abs: 1.2 10*3/uL — ABNORMAL LOW (ref 1.7–7.7)
Neutrophils Relative %: 25 % — ABNORMAL LOW (ref 43–77)
Platelets: 276 10*3/uL (ref 150–400)
RBC: 4.77 MIL/uL (ref 3.87–5.11)
RDW: 14.3 % (ref 11.5–15.5)
WBC: 4.7 10*3/uL (ref 4.0–10.5)

## 2012-02-13 NOTE — ED Notes (Signed)
The pt is c/o of an irregular hert rate for a while.  She has been placed on med by her doctor and changed  But she has had the same irregular heart rate today.  She was texting while  i triaged her

## 2012-02-13 NOTE — ED Notes (Signed)
Pt states that she took .25 Xanax around 1700 this evening and she took atenolol around 2330. She states that although she took those medications she is still feeling palpitations. Pt states that she does have a history of anxiety and palpitations. Pt's VSS. Pt states that she currently is not having palpitations, however she does feel anxious.

## 2012-02-13 NOTE — ED Provider Notes (Signed)
History     CSN: [id number]  Arrival date & time 02/13/12  0006   First MD Initiated Contact with Patient 02/13/12 0250      Chief Complaint  Patient presents with  . Palpitations    (Consider location/radiation/quality/duration/timing/severity/associated sxs/prior treatment) HPI 51 year old female presents to emergency room complaining of ongoing anxiety and palpitations. Patient is followed by cardiologist for palpitations. She reports she had been on metoprolol but was recently changed to atenolol about 6 weeks ago. She reports she does not like the way atenolol makes her feel. Her primary care doctor has her on Xanax for anxiety. She reports she has palpitations daily, but they were worse today along with a rattling sensation inside her body like her nerves were "jangling together". Patient took half of a 0.5 Xanax prior to going to work, but felt bad that despite this. Patient has been advised to take Cymbalta in the past for anxiety but has refused. She denies any illicit substances, no caffeine, no other stimulants. She denies any depression symptoms, and no new stressors at work or life. She has not had any chest pain or shortness of breath, no leg swelling. No other new symptoms.  Past Medical History  Diagnosis Date  . Palpitations   . Hypertension   . Hyperlipidemia   . Obesity     Past Surgical History  Procedure Date  . Carpal tunnel release     Family History  Problem Relation Age of Onset  . Diabetes Mother   . Coronary artery disease Father     History  Substance Use Topics  . Smoking status: Never Smoker   . Smokeless tobacco: Never Used  . Alcohol Use: No    OB History    Grav Para Term Preterm Abortions TAB SAB Ect Mult Living                  Review of Systems  All other systems reviewed and are negative.    Allergies  Pork allergy  Home Medications   Current Outpatient Rx  Name Route Sig Dispense Refill  . ALPRAZOLAM 0.5 MG PO TABS  Oral Take 0.5 mg by mouth at bedtime as needed. For sleep    . ATENOLOL 50 MG PO TABS Oral Take 50 mg by mouth daily.    Carma Leaven M PLUS PO TABS Oral Take 1 tablet by mouth daily.      BP 136/80  Pulse 63  Temp 98 F (36.7 C) (Oral)  Resp 18  SpO2 100%  Physical Exam  Nursing note and vitals reviewed. Constitutional: She is oriented to person, place, and time. She appears well-developed and well-nourished.  HENT:  Head: Normocephalic and atraumatic.  Nose: Nose normal.  Mouth/Throat: Oropharynx is clear and moist.  Eyes: Conjunctivae normal and EOM are normal. Pupils are equal, round, and reactive to light.  Neck: Normal range of motion. Neck supple. No JVD present. No tracheal deviation present. No thyromegaly present.  Cardiovascular: Normal rate, regular rhythm, normal heart sounds and intact distal pulses.  Exam reveals no gallop and no friction rub.   No murmur heard. Pulmonary/Chest: Effort normal and breath sounds normal. No stridor. No respiratory distress. She has no wheezes. She has no rales. She exhibits no tenderness.  Abdominal: Soft. Bowel sounds are normal. She exhibits no distension and no mass. There is no tenderness. There is no rebound and no guarding.  Musculoskeletal: Normal range of motion. She exhibits no edema and no tenderness.  Lymphadenopathy:  She has no cervical adenopathy.  Neurological: She is alert and oriented to person, place, and time. She exhibits normal muscle tone. Coordination normal.  Skin: Skin is warm and dry. No rash noted. No erythema. No pallor.  Psychiatric: Her behavior is normal. Judgment and thought content normal.       Anxiety noted    ED Course  Procedures (including critical care time)  Labs Reviewed  CBC WITH DIFFERENTIAL - Abnormal; Notable for the following:    Neutrophils Relative 25 (*)     Neutro Abs 1.2 (*)     Lymphocytes Relative 57 (*)     Monocytes Relative 14 (*)     All other components within normal limits    COMPREHENSIVE METABOLIC PANEL - Abnormal; Notable for the following:    Glucose, Bld 106 (*)     Total Bilirubin 0.2 (*)     GFR calc non Af Amer 72 (*)     GFR calc Af Amer 84 (*)     All other components within normal limits  LAB REPORT - SCANNED     Date: 02/13/2012  Rate: 83  Rhythm: normal sinus rhythm  QRS Axis: normal  Intervals: normal  ST/T Wave abnormalities: nonspecific ST/T changes  Conduction Disutrbances:none  Narrative Interpretation:   Old EKG Reviewed: none available    1. Palpitations   2. Anxiety       MDM  51 year old female with ongoing palpitations and anxiety. No irregularity seen on EKG today. Patient has good followup with primary care doctor and cardiologist. She has been worked up thoroughly in the past for this. Will provide her with mental health information, and strongly suggested patient consider being put on on a long-term mood stabilizing medication to help with her symptoms.        Olivia Mackie, MD 02/13/12 [phone]

## 2012-02-14 ENCOUNTER — Encounter: Payer: BC Managed Care – PPO | Attending: Internal Medicine | Admitting: *Deleted

## 2012-02-14 DIAGNOSIS — E669 Obesity, unspecified: Secondary | ICD-10-CM | POA: Insufficient documentation

## 2012-02-14 DIAGNOSIS — Z713 Dietary counseling and surveillance: Secondary | ICD-10-CM | POA: Insufficient documentation

## 2012-02-14 NOTE — Progress Notes (Signed)
  Medical Nutrition Therapy:  Appt start time: 1730 end time:  1800.  Assessment:  Primary concerns today: patient here for obesity follow up visit. Weight loss of 1.5 pounds in past 4 weeks noted. She continues with carb counting and is planning out her time for exercise more consistently now. She notes that she is consuming more high fat foods than planned and needs assistance with limiting that more effectively. She has cut out regular and sugar sweetened beverages and desserts. She states her MD has cleared her to exercise as tolerated.  MEDICATIONS: see list   DIETARY INTAKE:  Usual eating pattern includes 3 meals and 2 snacks per day.  Everyday foods include foods convenient to eat, usually on way to work, etc.  Avoided foods include pork.    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   Nutritional Diagnosis:  NI-1.5 Excessive energy intake As related to activity level.  As evidenced by BMI of 33.4 which is now down to 32.7    Intervention:  Commended her on her successful behavior changes. Reviewed her frequency of higher fat choices and suggested she consider choosing these every other day as twice a week is too hard right  now. Also recommend a schedule for exercise be added to her calendar to give it more importance. Showed her the [employer] APP as another quick option for increasing her activity level and to consider some rewards that would be meaningful to her for her successes.  Plan: Continue to aim for 3 Carbs (45 grams) per meal +/- 1 either way Continue to aim for 0-1 carbs per snack if hungry, add protein to avoid hunger Continue to aim for 3 servings per meal (15 grams) Consider limiting fried foods to every other day  Consider stationary bike and [employer] Work[employer] APP, put activity appointment on your calendar  Monitoring/Evaluation:  Dietary intake, exercise, reading food labels, and body weight in 4 week(s).

## 2012-02-14 NOTE — Patient Instructions (Signed)
Plan: Continue to aim for 3 Carbs (45 grams) per meal +/- 1 either way Continue to aim for 0-1 carbs per snack if hungry, add protein to avoid hunger Continue to aim for 3 servings per meal (15 grams) Consider limiting fried foods to every other day  Consider stationary bike and 7 Min[employer] Workout APP, put activity appointment on your calendar

## 2012-02-19 ENCOUNTER — Encounter: Payer: Self-pay | Admitting: *Deleted

## 2012-03-17 ENCOUNTER — Ambulatory Visit: Payer: BC Managed Care – PPO | Admitting: *Deleted

## 2012-03-18 ENCOUNTER — Encounter: Payer: Self-pay | Admitting: *Deleted

## 2012-03-18 ENCOUNTER — Encounter: Payer: BC Managed Care – PPO | Attending: Internal Medicine | Admitting: *Deleted

## 2012-03-18 DIAGNOSIS — E669 Obesity, unspecified: Secondary | ICD-10-CM | POA: Insufficient documentation

## 2012-03-18 DIAGNOSIS — Z713 Dietary counseling and surveillance: Secondary | ICD-10-CM | POA: Insufficient documentation

## 2012-03-18 NOTE — Progress Notes (Signed)
  Medical Nutrition Therapy:  Appt start time: 1615 end time:  1645.  Assessment:  Primary concerns today: patient here for obesity follow up visit. Weight stable at this visit. Patient states she has been going out more often with friends and was afraid she had gained weight. She has continued to cut out regular and sugar sweetened beverages and desserts. She states her MD has cleared her to exercise as tolerated.  MEDICATIONS: see list   DIETARY INTAKE:  Usual eating pattern includes 3 meals and 2 snacks per day.  Everyday foods include foods convenient to eat, usually on way to work, etc.  Avoided foods include pork.    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   Nutritional Diagnosis:  NI-1.5 Excessive energy intake As related to activity level.  As evidenced by BMI of 33.4 which is now down to 32.7    Intervention:  Commended her on still making informed decisions especially when eating out and reinforced her knowledge of carb and fat gram counting. Encouraged her to resume her activities as able. Also suggested she aim for weight maintenance through the holidays. Plan: Continue to aim for 3 Carbs (45 grams) per meal +/- 1  either way Continue to aim for 0-1 carbs per snack if hungry, add protein to avoid hunger Continue to aim for 3 servings of fat per meal (15 grams) Consider limiting fried foods to twice a week Consider stationary bike and 7 Minute Workout APP, put activity appointment on your calendar  Monitoring/Evaluation:  Dietary intake, exercise, reading food labels, and body weight in 8 week(s).

## 2012-03-18 NOTE — Patient Instructions (Addendum)
Plan: Continue to aim for 3 Carbs (45 grams) per meal +/- 1 either way Continue to aim for 0-1 carbs per snack if hungry, add protein to avoid hunger Continue to aim for 3 servings of fat per meal (15 grams) Consider limiting fried foods to twice a week Consider stationary bike and 7 Min[employer] Workout APP, put activity appointment on your calendar

## 2012-03-24 ENCOUNTER — Encounter: Payer: Self-pay | Admitting: *Deleted

## 2012-05-26 ENCOUNTER — Ambulatory Visit: Payer: BC Managed Care – PPO | Admitting: *Deleted

## 2012-07-07 ENCOUNTER — Ambulatory Visit: Payer: BC Managed Care – PPO | Admitting: *Deleted

## 2012-08-12 ENCOUNTER — Other Ambulatory Visit: Payer: Self-pay

## 2012-08-12 DIAGNOSIS — Z1231 Encounter for screening mammogram for malignant neoplasm of breast: Secondary | ICD-10-CM

## 2012-09-03 ENCOUNTER — Ambulatory Visit
Admission: RE | Admit: 2012-09-03 | Discharge: 2012-09-03 | Disposition: A | Discharge status: Home / Self Care | Payer: BC Managed Care – PPO | Source: Ambulatory Visit

## 2012-09-03 DIAGNOSIS — Z1231 Encounter for screening mammogram for malignant neoplasm of breast: Secondary | ICD-10-CM

## 2013-04-02 ENCOUNTER — Other Ambulatory Visit: Payer: Self-pay | Admitting: Obstetrics & Gynecology

## 2013-04-06 ENCOUNTER — Encounter (HOSPITAL_COMMUNITY): Payer: Self-pay | Admitting: Pharmacist

## 2013-04-14 ENCOUNTER — Encounter: Payer: Self-pay | Admitting: Interventional Cardiology

## 2013-04-14 DIAGNOSIS — R002 Palpitations: Secondary | ICD-10-CM | POA: Insufficient documentation

## 2013-04-14 DIAGNOSIS — E785 Hyperlipidemia, unspecified: Secondary | ICD-10-CM | POA: Insufficient documentation

## 2013-04-14 DIAGNOSIS — I10 Essential (primary) hypertension: Secondary | ICD-10-CM | POA: Insufficient documentation

## 2013-04-14 DIAGNOSIS — E669 Obesity, unspecified: Secondary | ICD-10-CM | POA: Insufficient documentation

## 2013-04-15 ENCOUNTER — Ambulatory Visit: Payer: BC Managed Care – PPO | Admitting: Interventional Cardiology

## 2013-04-15 ENCOUNTER — Encounter (HOSPITAL_COMMUNITY)
Admission: RE | Admit: 2013-04-15 | Discharge: 2013-04-15 | Disposition: A | Discharge status: Home / Self Care | Payer: BC Managed Care – PPO | Source: Ambulatory Visit | Attending: Obstetrics & Gynecology | Admitting: Obstetrics & Gynecology

## 2013-04-15 ENCOUNTER — Encounter (HOSPITAL_COMMUNITY): Payer: Self-pay

## 2013-04-15 DIAGNOSIS — Z01818 Encounter for other preprocedural examination: Secondary | ICD-10-CM | POA: Insufficient documentation

## 2013-04-15 DIAGNOSIS — Z01812 Encounter for preprocedural laboratory examination: Secondary | ICD-10-CM | POA: Insufficient documentation

## 2013-04-15 LAB — CBC
HCT: 37.3 % (ref 36.0–46.0)
Hemoglobin: 12.2 g/dL (ref 12.0–15.0)
MCH: 27 pg (ref 26.0–34.0)
MCHC: 32.7 g/dL (ref 30.0–36.0)
MCV: 82.5 fL (ref 78.0–100.0)
Platelets: 248 10*3/uL (ref 150–400)
RBC: 4.52 MIL/uL (ref 3.87–5.11)
RDW: 15.2 % (ref 11.5–15.5)
WBC: 5.2 10*3/uL (ref 4.0–10.5)

## 2013-04-15 LAB — BASIC METABOLIC PANEL
BUN: 14 mg/dL (ref 6–23)
CO2: 27 mEq/L (ref 19–32)
Calcium: 9 mg/dL (ref 8.4–10.5)
Chloride: 104 mEq/L (ref 96–112)
Creatinine, Ser: 0.84 mg/dL (ref 0.50–1.10)
GFR calc Af Amer: >90 mL/min (ref >90)
GFR calc non Af Amer: 79 mL/min — ABNORMAL LOW (ref >90)
Glucose, Bld: 102 mg/dL — ABNORMAL HIGH (ref 70–99)
Potassium: 3.7 mEq/L (ref 3.5–5.1)
Sodium: 141 mEq/L (ref 135–145)

## 2013-04-15 NOTE — Pre-Procedure Instructions (Signed)
EKG not done with PAT appt., Pt is seeing Dr.Varanasi today (04/15/13) for cardiac clearance.

## 2013-04-15 NOTE — Patient Instructions (Signed)
20 Madison Delgado  04/15/2013   Your procedure is scheduled on:  04/22/13  Enter through the Main Entrance of [facility] at 7 AM.  Pick up the phone at the desk and dial [phone].   Call this number if you have problems the morning of surgery: [phone]   Remember:   Do not eat food:After Midnight.  Do not drink clear liquids: After Midnight.  Take these medicines the morning of surgery with A SIP OF WATER: Atenolol and may take Xanax   Do not wear jewelry, make-up or nail polish.  Do not wear lotions, powders, or perfumes. You may wear deodorant.  Do not shave 48 hours prior to surgery.  Do not bring valuables to the hospital.  [facility] is not   responsible for any belongings or valuables brought to the hospital.  Contacts, dentures or bridgework may not be worn into surgery.  Leave suitcase in the car. After surgery it may be brought to your room.  For patients admitted to the hospital, checkout time is 11:00 AM the day of              discharge.   Patients discharged the day of surgery will not be allowed to drive             home.  Name and phone number of your driver: NA  Special Instructions:   Shower using CHG 2 nights before surgery and the night before surgery.  If you shower the day of surgery use CHG.  Use special wash - you have one bottle of CHG for all showers.  You should use approximately 1/3 of the bottle for each shower.   Please read over the following fact sheets that you were given:   Surgical Site Infection Prevention

## 2013-04-20 ENCOUNTER — Ambulatory Visit (INDEPENDENT_AMBULATORY_CARE_PROVIDER_SITE_OTHER): Payer: BC Managed Care – PPO | Admitting: Interventional Cardiology

## 2013-04-20 ENCOUNTER — Encounter: Payer: Self-pay | Admitting: Interventional Cardiology

## 2013-04-20 VITALS — BP 130/82 | HR 81 | Ht 65.0 in | Wt 190.0 lb

## 2013-04-20 DIAGNOSIS — Z01810 Encounter for preprocedural cardiovascular examination: Secondary | ICD-10-CM

## 2013-04-20 DIAGNOSIS — E669 Obesity, unspecified: Secondary | ICD-10-CM

## 2013-04-20 DIAGNOSIS — R002 Palpitations: Secondary | ICD-10-CM

## 2013-04-20 DIAGNOSIS — Z13220 Encounter for screening for lipoid disorders: Secondary | ICD-10-CM

## 2013-04-20 NOTE — Patient Instructions (Signed)
Your physician wants you to follow-up in: 1 Year with Dr. Varanasi. You will receive a reminder letter in the mail two months in advance. If you don't receive a letter, please call our office to schedule the follow-up appointment.  Your physician recommends that you continue on your current medications as directed. Please refer to the Current Medication list given to you today.  

## 2013-04-20 NOTE — Progress Notes (Signed)
Patient ID: Madison Delgado, female   DOB: [birth year], 52 y.o.   MRN: [id number]    [address] [place], [place]  [address] Phone: [phone] Fax:  [phone]  Date:  04/20/2013   ID:  Madison Delgado, DOB 1961-01-13, MRN [id number]  PCP:  Georgann Housekeeper, MD      History of Present Illness: Madison Delgado is a 52 y.o. female **who has had palpitations. She was diagnosed with inappropriate sinus tachycardia at [facility] back in 2005. She has been on beta blockers since that time. She had some palpitations.This improved greatly with taking her medicines regularly.  She last had an echocardiogram in 2013 which showed normal left ventricular function.  In 2005, she was instructed to try to improve her exercise tolerance and to reduce caffeine intake. She does avoid caffeine. She is trying to get back into more exercise. No chest pain. Some mild shortness of breath occurs with walking upstairs. She is having a hysterectomy on Wednesday. No chest pain. No syncope    Wt Readings from Last 3 Encounters:  04/20/13 190 lb (86.183 kg)  04/15/13 190 lb (86.183 kg)  03/18/12 188 lb 9.6 oz (85.548 kg)     Past Medical History  Diagnosis Date  . Palpitations   . Hypertension   . Hyperlipidemia   . Obesity     Current Outpatient Prescriptions  Medication Sig Dispense Refill  . ALPRAZolam (XANAX) 0.5 MG tablet Take 0.5 mg by mouth at bedtime as needed. For sleep      . atenolol (TENORMIN) 50 MG tablet Take 50 mg by mouth daily.      . Multiple Vitamin (MULTIVITAMIN) capsule Take 1 capsule by mouth daily.       No current facility-administered medications for this visit.    Allergies:    Allergies  Allergen Reactions  . Pork Allergy Swelling    Certain types of pork products cause lips to swell and SOB    Social History:  The patient  reports that she has quit smoking. She has never used smokeless tobacco. She reports that she does not drink alcohol or use  illicit drugs.   Family History:  The patient's family history includes Coronary artery disease in her father; Diabetes in her mother.   ROS:  Please see the history of present illness.  No nausea, vomiting.  No fevers, chills.  No focal weakness.  No dysuria.    All other systems reviewed and negative.   PHYSICAL EXAM: VS:  BP 130/82  Pulse 81  Ht 5\' 5"  (1.651 m)  Wt 190 lb (86.183 kg)  BMI 31.62 kg/m2 Well nourished, well developed, in no acute distress HEENT: normal Neck: no JVD, no carotid bruits Cardiac:  normal S1, S2; RRR;  Lungs:  clear to auscultation bilaterally, no wheezing, rhonchi or rales Abd: soft, nontender, no hepatomegaly Ext: no edema Skin: warm and dry Neuro:   no focal abnormalities noted  EKG:  *NSR, no significnant  ST segment changes   ASSESSMENT AND PLAN:  Palpitations  Continue Toprol XL Tablet Extended Release 24 Hour, 50 MG, 1 tablet, Orally, Once a day Symptoms improved. I recommended that she continue to try to avoid caffeine. She can still use extra Toprol as needed. Let us know if symptoms get worse.  Diagnosed with inappropriate sinus tachycardia back in 2005 at [facility]. She was on as high a dose of 100 mg of Toprol at that time.   Preoperative exam:  Echo from June 2013 reviewed. No significant structural heart disease. No furhter cardiac testing neded before surgery. Screening for lipid disorders: LDL < 70, HDL 44. In July 2013. Continues to try to improve her diet. Obesity: Stresed importance of diet control and exercise to help lose weight. She thinks it wil be 4-6 post op before she can exercise. She wants to join a walking group.  Signed, Fredric Mare, MD, [facility] 04/20/2013 2:13 PM

## 2013-04-21 MED ORDER — CEFAZOLIN SODIUM-DEXTROSE 2-3 GM-% IV SOLR
2.0000 g | INTRAVENOUS | Status: AC
  Administered 2013-04-22: 2 g via INTRAVENOUS

## 2013-04-22 ENCOUNTER — Observation Stay (HOSPITAL_COMMUNITY)
Admission: RE | Admit: 2013-04-22 | Discharge: 2013-04-23 | Disposition: A | Discharge status: Home / Self Care | Payer: BC Managed Care – PPO | Source: Ambulatory Visit | Attending: Obstetrics & Gynecology | Admitting: Obstetrics & Gynecology

## 2013-04-22 ENCOUNTER — Ambulatory Visit (HOSPITAL_COMMUNITY): Payer: BC Managed Care – PPO | Admitting: Anesthesiology

## 2013-04-22 ENCOUNTER — Encounter (HOSPITAL_COMMUNITY): Payer: Self-pay | Admitting: Anesthesiology

## 2013-04-22 ENCOUNTER — Encounter (HOSPITAL_COMMUNITY)
Admission: RE | Disposition: A | Discharge status: Home / Self Care | Payer: Self-pay | Source: Ambulatory Visit | Attending: Obstetrics & Gynecology

## 2013-04-22 ENCOUNTER — Encounter (HOSPITAL_COMMUNITY): Payer: BC Managed Care – PPO | Admitting: Anesthesiology

## 2013-04-22 DIAGNOSIS — N950 Postmenopausal bleeding: Principal | ICD-10-CM | POA: Diagnosis present

## 2013-04-22 DIAGNOSIS — D251 Intramural leiomyoma of uterus: Secondary | ICD-10-CM | POA: Insufficient documentation

## 2013-04-22 DIAGNOSIS — Z90710 Acquired absence of both cervix and uterus: Secondary | ICD-10-CM

## 2013-04-22 DIAGNOSIS — N8000 Endometriosis of the uterus, unspecified: Secondary | ICD-10-CM | POA: Insufficient documentation

## 2013-04-22 DIAGNOSIS — R002 Palpitations: Secondary | ICD-10-CM

## 2013-04-22 DIAGNOSIS — R339 Retention of urine, unspecified: Secondary | ICD-10-CM | POA: Insufficient documentation

## 2013-04-22 DIAGNOSIS — I10 Essential (primary) hypertension: Secondary | ICD-10-CM

## 2013-04-22 DIAGNOSIS — D252 Subserosal leiomyoma of uterus: Secondary | ICD-10-CM | POA: Insufficient documentation

## 2013-04-22 DIAGNOSIS — E785 Hyperlipidemia, unspecified: Secondary | ICD-10-CM

## 2013-04-22 DIAGNOSIS — Z87891 Personal history of nicotine dependence: Secondary | ICD-10-CM | POA: Insufficient documentation

## 2013-04-22 DIAGNOSIS — D219 Benign neoplasm of connective and other soft tissue, unspecified: Secondary | ICD-10-CM | POA: Diagnosis present

## 2013-04-22 DIAGNOSIS — D250 Submucous leiomyoma of uterus: Secondary | ICD-10-CM | POA: Insufficient documentation

## 2013-04-22 DIAGNOSIS — E669 Obesity, unspecified: Secondary | ICD-10-CM

## 2013-04-22 HISTORY — PX: ROBOTIC ASSISTED TOTAL HYSTERECTOMY: SHX6085

## 2013-04-22 HISTORY — DX: Acquired absence of both cervix and uterus: Z90.710

## 2013-04-22 HISTORY — DX: Benign neoplasm of connective and other soft tissue, unspecified: D21.9

## 2013-04-22 HISTORY — PX: BILATERAL SALPINGECTOMY: SHX5743

## 2013-04-22 HISTORY — DX: Postmenopausal bleeding: N95.0

## 2013-04-22 SURGERY — ROBOTIC ASSISTED TOTAL HYSTERECTOMY
Anesthesia: General

## 2013-04-22 MED ORDER — METOCLOPRAMIDE HCL 5 MG/ML IJ SOLN: 10.0000 mg | Freq: Once | INTRAMUSCULAR | Status: DC | PRN

## 2013-04-22 MED ORDER — ROCURONIUM BROMIDE 100 MG/10ML IV SOLN
INTRAVENOUS | Status: AC
  Filled 2013-04-22: qty 1

## 2013-04-22 MED ORDER — LACTATED RINGERS IR SOLN
Status: DC | PRN
  Administered 2013-04-22: 3000 mL

## 2013-04-22 MED ORDER — ARTIFICIAL TEARS OP OINT
TOPICAL_OINTMENT | OPHTHALMIC | Status: AC
  Filled 2013-04-22: qty 3.5

## 2013-04-22 MED ORDER — SODIUM CHLORIDE 0.9 % IJ SOLN
INTRAMUSCULAR | Status: DC | PRN
  Administered 2013-04-22: 60 mL

## 2013-04-22 MED ORDER — ROPIVACAINE HCL 5 MG/ML IJ SOLN
INTRAMUSCULAR | Status: AC
  Filled 2013-04-22: qty 60

## 2013-04-22 MED ORDER — MIDAZOLAM HCL 2 MG/2ML IJ SOLN
INTRAMUSCULAR | Status: DC | PRN
  Administered 2013-04-22: 2 mg via INTRAVENOUS

## 2013-04-22 MED ORDER — GLYCOPYRROLATE 0.2 MG/ML IJ SOLN
INTRAMUSCULAR | Status: AC
  Filled 2013-04-22: qty 1

## 2013-04-22 MED ORDER — SCOPOLAMINE 1 MG/3DAYS TD PT72
1.0000 | MEDICATED_PATCH | TRANSDERMAL | Status: DC
  Administered 2013-04-22: 1.5 mg via TRANSDERMAL

## 2013-04-22 MED ORDER — ONDANSETRON HCL 4 MG PO TABS: 4.0000 mg | ORAL_TABLET | Freq: Four times a day (QID) | ORAL | Status: DC | PRN

## 2013-04-22 MED ORDER — KETOROLAC TROMETHAMINE 30 MG/ML IJ SOLN: 30.0000 mg | Freq: Three times a day (TID) | INTRAMUSCULAR | Status: DC

## 2013-04-22 MED ORDER — PROPOFOL 10 MG/ML IV BOLUS
INTRAVENOUS | Status: DC | PRN
  Administered 2013-04-22: 200 mg via INTRAVENOUS

## 2013-04-22 MED ORDER — LACTATED RINGERS IV SOLN
INTRAVENOUS | Status: DC
  Administered 2013-04-22: 22:00:00 via INTRAVENOUS

## 2013-04-22 MED ORDER — LACTATED RINGERS IV SOLN
INTRAVENOUS | Status: DC
  Administered 2013-04-22 (×2): via INTRAVENOUS

## 2013-04-22 MED ORDER — MENTHOL 3 MG MT LOZG: 1.0000 | LOZENGE | OROMUCOSAL | Status: DC | PRN

## 2013-04-22 MED ORDER — MIDAZOLAM HCL 2 MG/2ML IJ SOLN
INTRAMUSCULAR | Status: AC
  Filled 2013-04-22: qty 2

## 2013-04-22 MED ORDER — FENTANYL CITRATE 0.05 MG/ML IJ SOLN
INTRAMUSCULAR | Status: AC
  Filled 2013-04-22: qty 5

## 2013-04-22 MED ORDER — PROPOFOL 10 MG/ML IV EMUL
INTRAVENOUS | Status: AC
  Filled 2013-04-22: qty 20

## 2013-04-22 MED ORDER — EPHEDRINE SULFATE 50 MG/ML IJ SOLN
INTRAMUSCULAR | Status: DC | PRN
  Administered 2013-04-22 (×2): 10 mg via INTRAVENOUS
  Administered 2013-04-22: 5 mg via INTRAVENOUS
  Administered 2013-04-22: 10 mg via INTRAVENOUS

## 2013-04-22 MED ORDER — OXYCODONE-ACETAMINOPHEN 5-325 MG PO TABS
1.0000 | ORAL_TABLET | ORAL | Status: DC | PRN
  Administered 2013-04-22 – 2013-04-23 (×6): 2 via ORAL
  Filled 2013-04-22 (×6): qty 2

## 2013-04-22 MED ORDER — LIDOCAINE HCL (CARDIAC) 20 MG/ML IV SOLN
INTRAVENOUS | Status: DC | PRN
  Administered 2013-04-22: 50 mg via INTRAVENOUS

## 2013-04-22 MED ORDER — HYDROMORPHONE HCL PF 1 MG/ML IJ SOLN: 0.2500 mg | INTRAMUSCULAR | Status: DC | PRN

## 2013-04-22 MED ORDER — MEPERIDINE HCL 25 MG/ML IJ SOLN: 6.2500 mg | INTRAMUSCULAR | Status: DC | PRN

## 2013-04-22 MED ORDER — ACETAMINOPHEN 10 MG/ML IV SOLN
1000.0000 mg | Freq: Once | INTRAVENOUS | Status: AC
  Administered 2013-04-22: 1000 mg via INTRAVENOUS
  Filled 2013-04-22: qty 100

## 2013-04-22 MED ORDER — CEFAZOLIN SODIUM-DEXTROSE 2-3 GM-% IV SOLR
INTRAVENOUS | Status: AC
  Filled 2013-04-22: qty 50

## 2013-04-22 MED ORDER — SODIUM CHLORIDE 0.9 % IJ SOLN
INTRAMUSCULAR | Status: AC
  Filled 2013-04-22: qty 10

## 2013-04-22 MED ORDER — ONDANSETRON HCL 4 MG/2ML IJ SOLN
INTRAMUSCULAR | Status: AC
  Filled 2013-04-22: qty 2

## 2013-04-22 MED ORDER — MENTHOL 3 MG MT LOZG
1.0000 | LOZENGE | OROMUCOSAL | Status: DC | PRN
  Filled 2013-04-22: qty 9

## 2013-04-22 MED ORDER — GLYCOPYRROLATE 0.2 MG/ML IJ SOLN
INTRAMUSCULAR | Status: DC | PRN
  Administered 2013-04-22: 0.2 mg via INTRAVENOUS
  Administered 2013-04-22: 0.6 mg via INTRAVENOUS

## 2013-04-22 MED ORDER — NEOSTIGMINE METHYLSULFATE 1 MG/ML IJ SOLN
INTRAMUSCULAR | Status: DC | PRN
  Administered 2013-04-22: 4 mg via INTRAVENOUS

## 2013-04-22 MED ORDER — ONDANSETRON HCL 4 MG/2ML IJ SOLN: 4.0000 mg | Freq: Four times a day (QID) | INTRAMUSCULAR | Status: DC | PRN

## 2013-04-22 MED ORDER — HYDROMORPHONE HCL PF 1 MG/ML IJ SOLN
INTRAMUSCULAR | Status: DC | PRN
  Administered 2013-04-22: 1 mg via INTRAVENOUS

## 2013-04-22 MED ORDER — HYDROMORPHONE HCL PF 1 MG/ML IJ SOLN
INTRAMUSCULAR | Status: AC
  Filled 2013-04-22: qty 1

## 2013-04-22 MED ORDER — ROCURONIUM BROMIDE 100 MG/10ML IV SOLN
INTRAVENOUS | Status: DC | PRN
  Administered 2013-04-22: 20 mg via INTRAVENOUS
  Administered 2013-04-22: 50 mg via INTRAVENOUS
  Administered 2013-04-22: 10 mg via INTRAVENOUS

## 2013-04-22 MED ORDER — FENTANYL CITRATE 0.05 MG/ML IJ SOLN
INTRAMUSCULAR | Status: DC | PRN
  Administered 2013-04-22: 100 ug via INTRAVENOUS
  Administered 2013-04-22: 150 ug via INTRAVENOUS

## 2013-04-22 MED ORDER — SCOPOLAMINE 1 MG/3DAYS TD PT72
MEDICATED_PATCH | TRANSDERMAL | Status: AC
  Filled 2013-04-22: qty 1

## 2013-04-22 MED ORDER — SODIUM CHLORIDE 0.9 % IJ SOLN
INTRAMUSCULAR | Status: AC
  Filled 2013-04-22: qty 100

## 2013-04-22 MED ORDER — GLYCOPYRROLATE 0.2 MG/ML IJ SOLN
INTRAMUSCULAR | Status: AC
  Filled 2013-04-22: qty 3

## 2013-04-22 MED ORDER — EPHEDRINE 5 MG/ML INJ
INTRAVENOUS | Status: AC
  Filled 2013-04-22: qty 10

## 2013-04-22 MED ORDER — NEOSTIGMINE METHYLSULFATE 1 MG/ML IJ SOLN
INTRAMUSCULAR | Status: AC
  Filled 2013-04-22: qty 1

## 2013-04-22 MED ORDER — LIDOCAINE HCL (CARDIAC) 20 MG/ML IV SOLN
INTRAVENOUS | Status: AC
  Filled 2013-04-22: qty 5

## 2013-04-22 MED ORDER — ATENOLOL 50 MG PO TABS
50.0000 mg | ORAL_TABLET | Freq: Every day | ORAL | Status: DC
  Administered 2013-04-23: 50 mg via ORAL
  Filled 2013-04-22 (×2): qty 1

## 2013-04-22 MED ORDER — ROPIVACAINE HCL 5 MG/ML IJ SOLN
INTRAMUSCULAR | Status: DC | PRN
  Administered 2013-04-22: 60 mg via EPIDURAL

## 2013-04-22 SURGICAL SUPPLY — 63 items
ADH SKN CLS APL DERMABOND .7 (GAUZE/BANDAGES/DRESSINGS) ×2
BAG URINE DRAINAGE (UROLOGICAL SUPPLIES) ×3 IMPLANT
BARRIER ADHS 3X4 INTERCEED (GAUZE/BANDAGES/DRESSINGS) IMPLANT
BRR ADH 4X3 ABS CNTRL BYND (GAUZE/BANDAGES/DRESSINGS)
CATH FOLEY 3WAY  5CC 16FR (CATHETERS) ×1
CATH FOLEY 3WAY 5CC 16FR (CATHETERS) ×2 IMPLANT
CHLORAPREP W/TINT 26ML (MISCELLANEOUS) ×3 IMPLANT
CLOTH BEACON ORANGE TIMEOUT ST (SAFETY) ×3 IMPLANT
CONT PATH 16OZ SNAP LID 3702 (MISCELLANEOUS) ×3 IMPLANT
COVER MAYO STAND STRL (DRAPES) ×3 IMPLANT
COVER TABLE BACK 60X90 (DRAPES) ×6 IMPLANT
COVER TIP SHEARS 8 DVNC (MISCELLANEOUS) ×2 IMPLANT
COVER TIP SHEARS 8MM DA VINCI (MISCELLANEOUS) ×1
DECANTER SPIKE VIAL GLASS SM (MISCELLANEOUS) ×7 IMPLANT
DERMABOND ADVANCED (GAUZE/BANDAGES/DRESSINGS) ×1
DERMABOND ADVANCED .7 DNX12 (GAUZE/BANDAGES/DRESSINGS) ×2 IMPLANT
DRAPE HUG U DISPOSABLE (DRAPE) ×3 IMPLANT
DRAPE LG THREE QUARTER DISP (DRAPES) ×6 IMPLANT
DRAPE WARM FLUID 44X44 (DRAPE) ×3 IMPLANT
ELECT REM PT RETURN 9FT ADLT (ELECTROSURGICAL) ×3
ELECTRODE REM PT RTRN 9FT ADLT (ELECTROSURGICAL) ×2 IMPLANT
EVACUATOR SMOKE 8.L (FILTER) ×4 IMPLANT
GAUZE VASELINE 3X9 (GAUZE/BANDAGES/DRESSINGS) IMPLANT
GLOVE BIO SURGEON STRL SZ7 (GLOVE) ×6 IMPLANT
GLOVE BIOGEL PI IND STRL 7.0 (GLOVE) ×4 IMPLANT
GLOVE BIOGEL PI INDICATOR 7.0 (GLOVE) ×2
GLOVE ECLIPSE 6.5 STRL STRAW (GLOVE) ×9 IMPLANT
GOWN STRL REIN XL XLG (GOWN DISPOSABLE) ×18 IMPLANT
KIT ACCESSORY DA VINCI DISP (KITS) ×1
KIT ACCESSORY DVNC DISP (KITS) ×2 IMPLANT
LEGGING LITHOTOMY PAIR STRL (DRAPES) ×3 IMPLANT
MANIPULATOR UTERINE 4.5 ZUMI (MISCELLANEOUS) IMPLANT
OCCLUDER COLPOPNEUMO (BALLOONS) ×3 IMPLANT
PACK LAVH (CUSTOM PROCEDURE TRAY) ×3 IMPLANT
PAD PREP 24X48 CUFFED NSTRL (MISCELLANEOUS) ×6 IMPLANT
PLUG CATH AND CAP STER (CATHETERS) ×3 IMPLANT
PROTECTOR NERVE ULNAR (MISCELLANEOUS) ×6 IMPLANT
SET CYSTO W/LG BORE CLAMP LF (SET/KITS/TRAYS/PACK) IMPLANT
SET IRRIG TUBING LAPAROSCOPIC (IRRIGATION / IRRIGATOR) ×3 IMPLANT
SOLUTION ELECTROLUBE (MISCELLANEOUS) ×3 IMPLANT
SUT VIC AB 0 CT1 27 (SUTURE) ×6
SUT VIC AB 0 CT1 27XBRD ANBCTR (SUTURE) IMPLANT
SUT VICRYL 0 27 CT2 27 ABS (SUTURE) IMPLANT
SUT VICRYL 0 UR6 27IN ABS (SUTURE) ×3 IMPLANT
SUT VICRYL 4-0 PS2 18IN ABS (SUTURE) ×6 IMPLANT
SUT VLOC 180 0 9IN  GS21 (SUTURE) ×1
SUT VLOC 180 0 9IN GS21 (SUTURE) IMPLANT
SYR 30ML LL (SYRINGE) ×3 IMPLANT
SYR 50ML LL SCALE MARK (SYRINGE) ×3 IMPLANT
SYSTEM CONVERTIBLE TROCAR (TROCAR) ×3 IMPLANT
TIP RUMI ORANGE 6.7MMX12CM (TIP) IMPLANT
TIP UTERINE 5.1X6CM LAV DISP (MISCELLANEOUS) IMPLANT
TIP UTERINE 6.7X10CM GRN DISP (MISCELLANEOUS) IMPLANT
TIP UTERINE 6.7X6CM WHT DISP (MISCELLANEOUS) IMPLANT
TIP UTERINE 6.7X8CM BLUE DISP (MISCELLANEOUS) ×1 IMPLANT
TOWEL OR 17X24 6PK STRL BLUE (TOWEL DISPOSABLE) ×6 IMPLANT
TROCAR 12M 150ML BLUNT (TROCAR) ×3 IMPLANT
TROCAR DISP BLADELESS 8 DVNC (TROCAR) IMPLANT
TROCAR DISP BLADELESS 8MM (TROCAR) ×1
TROCAR XCEL 12X100 BLDLESS (ENDOMECHANICALS) ×3 IMPLANT
TUBING FILTER THERMOFLATOR (ELECTROSURGICAL) ×3 IMPLANT
WARMER LAPAROSCOPE (MISCELLANEOUS) ×3 IMPLANT
WATER STERILE IRR 1000ML POUR (IV SOLUTION) ×9 IMPLANT

## 2013-04-22 NOTE — Op Note (Addendum)
Preoperative diagnosis:  Uterine fibroids, recurrent postmenopausal bleeding Postop diagnosis: Same Procedure: da Vinci robot assisted total laparoscopic hysterectomy and bilateral salpingectomy Anesthesia Gen. Endotracheal Surgeon: Dr. Shea Evans Assistant: Dr Algie Coffer IV fluids: LR EBL: 50cc Urine output: clear in foley Complications: none Pathology: Uterus with fibroid with cervix and both fallopian tubes Disposition: PACU, stable Findings: Enlarged fibroid uterus and normal ovaries and fallopian tubes. Normal appearing liver surface. Normal gross appearance of bowel and omentum, appendix  Procedure:  Indication: -- 52 yo, postmenopausal female with recurrent vaginal bleeding. Has fibroid uterus, benign endometrial biopsy. After reviewing options including expectant management, decision was made to proceed with hysterectomy. Complications of surgery including infection, bleeding, damage to internal organs and other surgery related problems including pneumonia, VTE reviewed and informed written consent was obtained. Patient chose to keep ovaries considering her age and negative family history for breast/ ovarian cancers. She understood and gave informed written consent.  Patient was brought to the operating room with IV running. She received 2 gm Ancef. Underwent general anesthesia without difficulty and was given dorsal lithotomy position, prepped and draped in sterile fashion. 3-way Foley catheter was placed. Cervix was exposed with a speculum and anterior lip of the cervix was grasped with tenaculum, paracervical block with 10cc dilute Ropivacaine given. 2 stay sutures placed on cervix. Uterus was sounded to 9 cm. A  # 8 Rumi tip and a medium Koh ring was assembled on the [employer] and entered in the uterine cavity and balloon was inflated to secure it in place. Koh ring was palpated again cervico-vaginal junction. Speculum, tenaculum removed.   Attention was focused on abdomen.  Supraumbilical 12 mm vertical incision made  with scalpel after injecting Marcaine, fascia dissected, grasped with Kocher's and incised, posterior rectus sheath and peritoneum grasped, incised, intraabdominal entry confirmed. Purse string stay stitch on 0-Vicryl taken on fascia and Hassan cannula introduced and Vicryl sutures secured on the Hassan cannula. Pneumoperitoneum was begun. Laparoscope was introduced and the peritoneal cavity was evaluated. There was no evidence of adhesions.Trendelenburg position given. 60 cc dilute Ropivacaine instilled in the pelvic cavity. Port sited marked and injected with Ropivacaine. Two Robotic cannulas inserted on right side and one Robotic and one assistant 5 mm trocar inserted on the left side under vision. Robot was docked from right. PK and Scissors and Prograsp introduced through Robotic arms.   Dr. Juliene Pina scrubbed out and went for surgical console. 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 Ovaries, tubes appeared normal. Ureters were not seen well due to peritoneal fat but there were no adhesions in the pelvis. Ovarian fossas were clear of any disease though a window was noted on the posterior peritoneum on the left of cul-de sac.  Uterus was deviated to the left.. Right fallopian tube was grasped, mesosalpinx desiccated and cut. Right utero-ovarian ligament was desiccated and cut followed by right round ligament. Anterior bladder broad ligament was opened and incised. Posterior broad ligament incised up to the uterosacral ligament and left uterine vessels skeletonized. Uterus was deviated to the right and left fallopian tube was grasped, mesosalpinx desiccated and cut. Left utero-ovarian ligament was desiccated and cut followed by left round ligament. Anterior bladder broad ligament was opened and incised. Posterior broad ligament incised up to the uterosacral ligament and left uterine vessels skeletonized. Anterior dissection performed,  bladder was pushed away by blunt and sharp dissection with excellent hemostasis and cervicovaginal junction was exposed, Koh ring impression was seen well anteriorly. Right posterior  broad ligament dissected, right Uterine vessels skeletonized. Adhesions in cul de sac noted pulling up the rectum on right uterosacral. Adhesions released with good hemostasis. Right uterine vessels were desiccated and cut. Uterus was deviated to the right and the left uterine vessels were desiccated and cut. Vaginal occluder was inflated. Colpotomy was begun starting from midline posteriorly coming to the right and then left. Left bleeding uterine vessel was desiccated again. Colpotomy was completed anteriorly. Specimen was large due to large posterior myoma. Partial myomectomy performed to flip the attached myoma superior to the uterine fundus. Uterus, cervix, both tubes were gently pulled out of the vaginal opening. Vaginal occluder placed in vagina for hemostasis and specimen was handed off. Vaginal cut edges were evaluated for hemostasis which was excellent. Irrigation was performed pedicles appeared dry. Robotic instruments switched for needle driver and long tip tissue forceps. 0- V-lock was used for vaginal cuff suturing in continuous running fashion from left to right and return to the midline. Hemostasis was excellent.  Irrigation was performed, all pedicles appeared to be hemostatic. Robotic instruments were removed. Robot was de-docked. Patient was made supine. Cannulas were removed under vision. Laparoscope and central port removed under vision. The stay sutures at the fascia tied together with excellent fascial closure. Skin approximated with subcuticular stitches on 4-0 Vicryl. Dermabond was applied. No vaginal bleeding noted on vaginal exam at the end. All instruments/lap/sponges counts were correct x2.  No complications. Patient tolerated procedure well and was reversed from anesthesia and brought to the PACU stable  condition. Dr Juliene Pina was the surgeon for entire case.   Hilary Hertz, MD

## 2013-04-22 NOTE — Anesthesia Preprocedure Evaluation (Addendum)
Anesthesia Evaluation  Patient identified by MRN, date of birth, ID band Patient awake    Reviewed: Allergy & Precautions, H&P , NPO status , Patient's Chart, lab work & pertinent test results  Airway Mallampati: III TM Distance: >3 FB Neck ROM: Full    Dental no notable dental hx. (+) Missing   Pulmonary former smoker,  breath sounds clear to auscultation  Pulmonary exam normal       Cardiovascular hypertension, Pt. on medications and Pt. on home beta blockers Rhythm:Regular Rate:Normal  Hx/o palpitations   Neuro/Psych negative neurological ROS  negative psych ROS   GI/Hepatic negative GI ROS, Neg liver ROS,   Endo/Other  Obesity  Renal/GU negative Renal ROS  negative genitourinary   Musculoskeletal negative musculoskeletal ROS (+)   Abdominal (+) + obese,   Peds  Hematology negative hematology ROS (+)   Anesthesia Other Findings   Reproductive/Obstetrics Uterine fibroids                          Anesthesia Physical Anesthesia Plan  ASA: II  Anesthesia Plan: General   Post-op Pain Management:    Induction: Intravenous  Airway Management Planned: Oral ETT  Additional Equipment:   Intra-op Plan:   Post-operative Plan: Extubation in OR  Informed Consent: I have reviewed the patients History and Physical, chart, labs and discussed the procedure including the risks, benefits and alternatives for the proposed anesthesia with the patient or authorized representative who has indicated his/her understanding and acceptance.   Dental advisory given  Plan Discussed with: Anesthesiologist, Surgeon and CRNA  Anesthesia Plan Comments:         Anesthesia Quick Evaluation

## 2013-04-22 NOTE — Anesthesia Postprocedure Evaluation (Signed)
  Anesthesia Post-op Note  Patient: Madison Delgado  Procedure(s) Performed: Procedure(s) with comments: ROBOTIC ASSISTED TOTAL HYSTERECTOMY (N/A) - 3 1/2 hrs. BILATERAL SALPINGECTOMY (Bilateral)  Patient Location: PACU  Anesthesia Type:General  Level of Consciousness: awake, alert  and oriented  Airway and Oxygen Therapy: Patient Spontanous Breathing  Post-op Pain: mild  Post-op Assessment: Post-op Vital signs reviewed, Patient's Cardiovascular Status Stable, Respiratory Function Stable, Patent Airway, No signs of Nausea or vomiting and Pain level controlled  Post-op Vital Signs: Reviewed and stable  Complications: No apparent anesthesia complications

## 2013-04-22 NOTE — Transfer of Care (Signed)
Immediate Anesthesia Transfer of Care Note  Patient: Madison Delgado  Procedure(s) Performed: Procedure(s) with comments: ROBOTIC ASSISTED TOTAL HYSTERECTOMY (N/A) - 3 1/2 hrs. BILATERAL SALPINGECTOMY (Bilateral)  Patient Location: PACU  Anesthesia Type:General  Level of Consciousness: awake, alert  and oriented  Airway & Oxygen Therapy: Patient Spontanous Breathing and Patient connected to nasal cannula oxygen  Post-op Assessment: Report given to PACU RN and Post -op Vital signs reviewed and stable  Post vital signs: Reviewed and stable  Complications: No apparent anesthesia complications

## 2013-04-22 NOTE — Anesthesia Procedure Notes (Signed)
Procedure Name: Intubation Date/Time: 04/22/2013 8:44 AM Performed by: Shanon Payor Pre-anesthesia Checklist: Patient being monitored, Suction available, Emergency Drugs available, Patient identified and Timeout performed Patient Re-evaluated:Patient Re-evaluated prior to inductionOxygen Delivery Method: Circle system utilized Preoxygenation: Pre-oxygenation with 100% oxygen Intubation Type: IV induction Ventilation: Mask ventilation without difficulty Grade View: Grade II Tube type: Oral Tube size: 7.0 mm Number of attempts: 1 Airway Equipment and Method: Stylet Placement Confirmation: ETT inserted through vocal cords under direct vision,  positive ETCO2 and breath sounds checked- equal and bilateral Secured at: 22 cm Tube secured with: Tape Dental Injury: Teeth and Oropharynx as per pre-operative assessment

## 2013-04-22 NOTE — H&P (Signed)
Madison Delgado is an 52 y.o. female [phone], here for hysterectomy for symptomatic uterine fibroids and abnormal postmenopausal bleeding and pain. Endometrial biopsy was benign but patient is anxious due to off and on uterine bleeding. Normal Paps. Desires to keep ovaries. Denies medical management and denies submucous fibroid excision.   No LMP recorded. Patient is not currently having periods (Reason: Perimenopausal).    Past Medical History  Diagnosis Date  . Palpitations   . Hypertension   . Hyperlipidemia   . Obesity     Past Surgical History  Procedure Laterality Date  . Carpal tunnel release      Family History  Problem Relation Age of Onset  . Diabetes Mother   . Coronary artery disease Father     Social History:  reports that she has quit smoking. She has never used smokeless tobacco. She reports that she does not drink alcohol or use illicit drugs.  Allergies:  Allergies  Allergen Reactions  . Pork Allergy Swelling    Certain types of pork products cause lips to swell and SOB    Prescriptions prior to admission  Medication Sig Dispense Refill  . atenolol (TENORMIN) 50 MG tablet Take 50 mg by mouth daily.      . Multiple Vitamin (MULTIVITAMIN) capsule Take 1 capsule by mouth daily.      Marland Kitchen ALPRAZolam (XANAX) 0.5 MG tablet Take 0.5 mg by mouth at bedtime as needed. For sleep        Review of Systems  Constitutional: Negative for fever.  Respiratory: Negative for shortness of breath.   Cardiovascular: Negative for chest pain.  Genitourinary: Negative for dysuria.  Skin: Negative for rash.  Neurological: Negative for headaches.    Blood pressure 141/82, pulse 80, temperature 98.2 F (36.8 C), temperature source Oral, resp. rate 18, SpO2 100.00%. Physical Exam  A&O x 3, no acute distress. Pleasant HEENT neg, no thyromegaly Lungs CTA bilat CV RRR, S1S2 normal Abdo soft, non tender, non acute Extr no edema/ tenderness Pelvic Uterus 10-12 wks, mobile,  normal cervix, no adnexal masses   Assessment/Plan: 52 yo, menopausal female with fibroids and abnormal uterine bleeding. Desires total laparoscopic hysterectomy with bilateral salpingectomy. Keep ovaries if normal in appearance per pt's choice since not having hot flashes (though FSH is elevated).  Risks/complications of surgery reviewed incl infection, bleeding, damage to internal organs including bladder, bowels, ureters, blood vessels, other risks from anesthesia, VTE and delayed complications of any surgery, complications in future surgery reviewed.    Afshin Chrystal R 04/22/2013, 8:15 AM

## 2013-04-23 ENCOUNTER — Encounter (HOSPITAL_COMMUNITY): Payer: Self-pay | Admitting: Obstetrics & Gynecology

## 2013-04-23 MED ORDER — IBUPROFEN 200 MG PO TABS: 600.0000 mg | ORAL_TABLET | Freq: Three times a day (TID) | ORAL | Status: DC | PRN

## 2013-04-23 MED ORDER — DOCUSATE SODIUM 100 MG PO CAPS: 100.0000 mg | ORAL_CAPSULE | Freq: Two times a day (BID) | ORAL | Status: DC

## 2013-04-23 MED ORDER — OXYCODONE-ACETAMINOPHEN 5-325 MG PO TABS: 1.0000 | ORAL_TABLET | Freq: Four times a day (QID) | ORAL | Status: DC | PRN

## 2013-04-23 NOTE — Progress Notes (Signed)
Patient ID: Madison Delgado, female   DOB: 1960-12-06, 52 y.o.   MRN: [id number] Subjective: Voiding difficulty. Straight cath w 700 cc clear urine. No flank pain, no hematuria, feels better since straight cath. Some nausea with food, feels gassy, but overall fine. Pain well controlled. No vag bleeding. Is ambulating well.   Objective: Vital signs in last 24 hours: Temp:  [97.5 F (36.4 C)-98.6 F (37 C)] 98.3 F (36.8 C) (12/11 1002) Pulse Rate:  [61-74] 68 (12/11 1002) Resp:  [14-18] 18 (12/11 1002) BP: (100-130)/(59-72) 100/67 mmHg (12/11 1002) SpO2:  [97 %-100 %] 100 % (12/11 1002) Weight:  [190 lb (86.183 kg)] 190 lb (86.183 kg) (12/10 1330) Weight change:  Last BM Date: 04/21/13  Intake/Output from previous day: 12/10 0701 - 12/11 0700 In: 4565.8 [P.O.:1020; I.V.:3545.8] Out: 2050 [Urine:2000; Blood:50] Intake/Output this shift: Total I/O In: 100 [P.O.:100] Out: 1000 [Urine:1000]  Physical exam:  A&O x 3, no acute distress. Pleasant HEENT neg, no thyromegaly Lungs CTA bilat CV RRR, S1S2 normal Abdo soft, gaseous. Appropriately tender, non acute, incisions CDI. Active BS. Extr no edema/ tenderness Pelvic no active bleeding reported.   Assessment/Plan: S/p daVinci TLH/bilat tubes. Gas pain and urinary retention/voiding difficulty. Watch next few voids and assess post-void residual checks w bladder scanner. If voids>50% of volume, ok to discharge home, o/w plan d/c home with foley. Post-op care reviewed, including reportable symptoms- fever/ chills/ bleeding/ nasea/vomiting/ CP/SOB/HAs etc.  F/up in office in 2 wks and in 48 hrs if going home with foley.   LOS: 1 day   Madison Delgado R 04/23/2013, 10:06 AM

## 2013-04-23 NOTE — Discharge Summary (Signed)
Physician Discharge Summary  Patient ID: TASNIM BALENTINE MRN: [id number] DOB/AGE: 01-25-1961 52 y.o.  Admit date: 04/22/2013 Discharge date: 04/23/2013  Admission Diagnoses: Postmenopausal recurrent bleeding, uterine fibroids  Discharge Diagnoses:  Principal Problem:   Postmenopausal bleeding Active Problems:   Fibroids   S/P hysterectomy  Discharged Condition: stable. S/p daVinci TLH/bilat tubes  Hospital Course: Uncomplicated surgery. Stable vital signs. Post op pain well controlled, some gas pain and urinary retention/voiding difficulty. Watch next few voids and assess post-void residual checks w bladder scanner. If voids>50% of volume, ok to discharge home, o/w plan d/c home with foley.  Discharge Exam: Blood pressure 100/67, pulse 68, temperature 98.3 F (36.8 C), temperature source Oral, resp. rate 18, height 5\' 3"  (1.6 m), weight 190 lb (86.183 kg), SpO2 100.00%. A&O x 3, no acute distress. Pleasant HEENT neg Lungs CTA bilat CV RRR, S1S2 normal Abdo soft, gaseous. Appropriately tender, non acute, incisions CDI. Active BS. Extr no edema/ tenderness Pelvic no active bleeding reported.   Disposition: 01-Home or Self Care Post-op care reviewed, including reportable symptoms- fever/ chills/ bleeding/ nasea/vomiting/ CP/SOB/HAs etc.  F/up in office in 2 wks and in 48 hrs if going home with foley.  Discharge Orders   Future Orders Complete By Expires   Call MD for:  difficulty breathing, headache or visual disturbances  As directed    Call MD for:  extreme fatigue  As directed    Call MD for:  hives  As directed    Call MD for:  persistant dizziness or light-headedness  As directed    Call MD for:  persistant nausea and vomiting  As directed    Call MD for:  redness, tenderness, or signs of infection (pain, swelling, redness, odor or green/yellow discharge around incision site)  As directed    Call MD for:  severe uncontrolled pain  As directed    Call MD for:   temperature >100.4  As directed    Comments:     Vaginal bleeding that is more than light spots of blood   Diet - low sodium heart healthy  As directed    Driving Restrictions  As directed    Comments:     For 2 wks   Increase activity slowly  As directed    Lifting restrictions  As directed    Comments:     20 lbs for 6 wks   Sexual Activity Restrictions  As directed    Comments:     8 weeks       Medication List         ALPRAZolam 0.5 MG tablet  Commonly known as:  XANAX  Take 0.5 mg by mouth at bedtime as needed. For sleep     atenolol 50 MG tablet  Commonly known as:  TENORMIN  Take 50 mg by mouth daily.     docusate sodium 100 MG capsule  Commonly known as:  COLACE  Take 1 capsule (100 mg total) by mouth 2 (two) times daily.     ibuprofen 200 MG tablet  Commonly known as:  ADVIL  Take 3 tablets (600 mg total) by mouth every 8 (eight) hours as needed for mild pain or moderate pain.     multivitamin capsule  Take 1 capsule by mouth daily.     oxyCODONE-acetaminophen 5-325 MG per tablet  Commonly known as:  PERCOCET/ROXICET  Take 1-2 tablets by mouth every 6 (six) hours as needed for severe pain (moderate to severe pain (when tolerating fluids)).  Follow-up Information   Follow up with Olean Sangster R, MD In 2 weeks. (on 05/08/13. On 12/15 if went home with foley)    Specialty:  Obstetrics and Gynecology   Contact information:   [address] [place] [address] [phone]       Signed: Robley Fries 04/23/2013, 10:21 AM

## 2013-04-23 NOTE — Progress Notes (Signed)
Pt home with foley catheter d/t acute urinary retention.  Pt instructed on use of large drainage bag and leg bag; she chose to wear the latter home.  Pt performed return demonstration on emptying both bags and on correct leg strap placement.  Pt verbalized understanding of need to perform peri-care and clean catheter with soap and water daily.    Pt discharged home with friend.  Pt ambulated to car with RN.  No equipment other than catheter ordered for home at discharge.

## 2013-08-17 ENCOUNTER — Other Ambulatory Visit: Payer: Self-pay

## 2013-08-17 DIAGNOSIS — Z1231 Encounter for screening mammogram for malignant neoplasm of breast: Secondary | ICD-10-CM

## 2013-09-04 ENCOUNTER — Ambulatory Visit
Admission: RE | Admit: 2013-09-04 | Discharge: 2013-09-04 | Disposition: A | Discharge status: Home / Self Care | Payer: BC Managed Care – PPO | Source: Ambulatory Visit

## 2013-09-04 DIAGNOSIS — Z1231 Encounter for screening mammogram for malignant neoplasm of breast: Secondary | ICD-10-CM

## 2014-02-16 ENCOUNTER — Emergency Department (HOSPITAL_COMMUNITY)
Admission: EM | Admit: 2014-02-16 | Discharge: 2014-02-16 | Disposition: A | Discharge status: Home / Self Care | Payer: BC Managed Care – PPO | Attending: Emergency Medicine | Admitting: Emergency Medicine

## 2014-02-16 ENCOUNTER — Encounter (HOSPITAL_COMMUNITY): Payer: Self-pay | Admitting: Emergency Medicine

## 2014-02-16 DIAGNOSIS — I10 Essential (primary) hypertension: Secondary | ICD-10-CM | POA: Insufficient documentation

## 2014-02-16 DIAGNOSIS — Z87891 Personal history of nicotine dependence: Secondary | ICD-10-CM | POA: Diagnosis not present

## 2014-02-16 DIAGNOSIS — E669 Obesity, unspecified: Secondary | ICD-10-CM | POA: Insufficient documentation

## 2014-02-16 DIAGNOSIS — Z8742 Personal history of other diseases of the female genital tract: Secondary | ICD-10-CM | POA: Insufficient documentation

## 2014-02-16 DIAGNOSIS — R002 Palpitations: Secondary | ICD-10-CM | POA: Insufficient documentation

## 2014-02-16 DIAGNOSIS — E785 Hyperlipidemia, unspecified: Secondary | ICD-10-CM | POA: Insufficient documentation

## 2014-02-16 DIAGNOSIS — Z79899 Other long term (current) drug therapy: Secondary | ICD-10-CM | POA: Diagnosis not present

## 2014-02-16 LAB — BASIC METABOLIC PANEL
Anion gap: 10 (ref 5–15)
BUN: 16 mg/dL (ref 6–23)
CO2: 28 mEq/L (ref 19–32)
Calcium: 9.2 mg/dL (ref 8.4–10.5)
Chloride: 103 mEq/L (ref 96–112)
Creatinine, Ser: 0.88 mg/dL (ref 0.50–1.10)
GFR calc Af Amer: 86 mL/min — ABNORMAL LOW (ref >90)
GFR calc non Af Amer: 74 mL/min — ABNORMAL LOW (ref >90)
Glucose, Bld: 94 mg/dL (ref 70–99)
Potassium: 4.3 mEq/L (ref 3.7–5.3)
Sodium: 141 mEq/L (ref 137–147)

## 2014-02-16 LAB — URINALYSIS, ROUTINE W REFLEX MICROSCOPIC
Bilirubin Urine: NEGATIVE
Glucose, UA: NEGATIVE mg/dL
Hgb urine dipstick: NEGATIVE
Ketones, ur: NEGATIVE mg/dL
Leukocytes, UA: NEGATIVE
Nitrite: NEGATIVE
Protein, ur: NEGATIVE mg/dL
Specific Gravity, Urine: 1.019 (ref 1.005–1.030)
Urobilinogen, UA: 0.2 mg/dL (ref 0.0–1.0)
pH: 5.5 (ref 5.0–8.0)

## 2014-02-16 LAB — CBC WITH DIFFERENTIAL/PLATELET
Basophils Absolute: 0 10*3/uL (ref 0.0–0.1)
Basophils Relative: 1 % (ref 0–1)
Eosinophils Absolute: 0.2 10*3/uL (ref 0.0–0.7)
Eosinophils Relative: 4 % (ref 0–5)
HCT: 40.1 % (ref 36.0–46.0)
Hemoglobin: 13.1 g/dL (ref 12.0–15.0)
Lymphocytes Relative: 38 % (ref 12–46)
Lymphs Abs: 1.5 10*3/uL (ref 0.7–4.0)
MCH: 27.2 pg (ref 26.0–34.0)
MCHC: 32.7 g/dL (ref 30.0–36.0)
MCV: 83.4 fL (ref 78.0–100.0)
Monocytes Absolute: 0.5 10*3/uL (ref 0.1–1.0)
Monocytes Relative: 13 % — ABNORMAL HIGH (ref 3–12)
Neutro Abs: 1.8 10*3/uL (ref 1.7–7.7)
Neutrophils Relative %: 44 % (ref 43–77)
Platelets: 241 10*3/uL (ref 150–400)
RBC: 4.81 MIL/uL (ref 3.87–5.11)
RDW: 14 % (ref 11.5–15.5)
WBC: 4 10*3/uL (ref 4.0–10.5)

## 2014-02-16 LAB — TROPONIN I: Troponin I: <0.3 ng/mL (ref <0.30)

## 2014-02-16 NOTE — ED Provider Notes (Signed)
CSN: [id number]     Arrival date & time 02/16/14  0715 History   First MD Initiated Contact with Patient 02/16/14 [phone]     Chief Complaint  Patient presents with  . Palpitations     (Consider location/radiation/quality/duration/timing/severity/associated sxs/prior Treatment) HPI Comments: Patient presents to the ER for evaluation of heart palpitations. Patient reports that she has a long history of palpitations. She started having increased palpitations in the past week. She saw her primary care doctor in the office. He did blood work including a TSH, results unknown. He told her to take 1-1/2 tablets of her atenolol and prescribe her Xanax to be used as needed for anxiety. Patient has followup next week with her cardiologist, Doctor Irish Lack.  She reports that when she was getting ready for work this point she had an increase in her palpitations. She has not had any fast heart rate. She felt like her heart is pounding and is irregular at times. There is no chest pain. She is not short of breath.  Patient is a 53 y.o. female presenting with palpitations.  Palpitations   Past Medical History  Diagnosis Date  . Palpitations   . Hypertension   . Hyperlipidemia   . Obesity   . Postmenopausal bleeding 04/22/2013  . Fibroids 04/22/2013   Past Surgical History  Procedure Laterality Date  . Carpal tunnel release    . Robotic assisted total hysterectomy N/A 04/22/2013    Procedure: ROBOTIC ASSISTED TOTAL HYSTERECTOMY;  Surgeon: Elveria Royals, MD;  Location: [facility];  Service: Gynecology;  Laterality: N/A;  3 1/2 hrs.  . Bilateral salpingectomy Bilateral 04/22/2013    Procedure: BILATERAL SALPINGECTOMY;  Surgeon: Elveria Royals, MD;  Location: [facility];  Service: Gynecology;  Laterality: Bilateral;   Family History  Problem Relation Age of Onset  . Diabetes Mother   . Coronary artery disease Father    History  Substance Use Topics  . Smoking status: Former Research scientist (life sciences)  . Smokeless  tobacco: Never Used  . Alcohol Use: No   OB History   Grav Para Term Preterm Abortions TAB SAB Ect Mult Living                 Review of Systems  Cardiovascular: Positive for palpitations.  All other systems reviewed and are negative.     Allergies  Pork allergy  Home Medications   Prior to Admission medications   Medication Sig Start Date End Date Taking? Authorizing Provider  ALPRAZolam Duanne Moron) 0.5 MG tablet Take 0.5 mg by mouth 2 (two) times daily as needed for anxiety. For sleep   Yes Historical Provider, MD  atenolol (TENORMIN) 50 MG tablet Take 75 mg by mouth daily.    Yes Historical Provider, MD  Calcium Carb-Cholecalciferol (CALCIUM 500 +D PO) Take 1 tablet by mouth daily.   Yes Historical Provider, MD  fluticasone (FLONASE) 50 MCG/ACT nasal spray Place 2 sprays into both nostrils daily as needed for allergies or rhinitis.   Yes Historical Provider, MD  Multiple Vitamin (MULTIVITAMIN) capsule Take 1 capsule by mouth daily.   Yes Historical Provider, MD   BP 139/75  Pulse 82  Temp(Src) 98.3 F (36.8 C)  Resp 18  SpO2 100% Physical Exam  Constitutional: She is oriented to person, place, and time. She appears well-developed and well-nourished. No distress.  HENT:  Head: Normocephalic and atraumatic.  Right Ear: Hearing normal.  Left Ear: Hearing normal.  Nose: Nose normal.  Mouth/Throat: Oropharynx is clear and moist and mucous  membranes are normal.  Eyes: Conjunctivae and EOM are normal. Pupils are equal, round, and reactive to light.  Neck: Normal range of motion. Neck supple.  Cardiovascular: Regular rhythm, S1 normal and S2 normal.  Exam reveals no gallop and no friction rub.   No murmur heard. Pulmonary/Chest: Effort normal and breath sounds normal. No respiratory distress. She exhibits no tenderness.  Abdominal: Soft. Normal appearance and bowel sounds are normal. There is no hepatosplenomegaly. There is no tenderness. There is no rebound, no guarding, no  tenderness at McBurney's point and negative Murphy's sign. No hernia.  Musculoskeletal: Normal range of motion.  Neurological: She is alert and oriented to person, place, and time. She has normal strength. No cranial nerve deficit or sensory deficit. Coordination normal. GCS eye subscore is 4. GCS verbal subscore is 5. GCS motor subscore is 6.  Skin: Skin is warm, dry and intact. No rash noted. No cyanosis.  Psychiatric: She has a normal mood and affect. Her speech is normal and behavior is normal. Thought content normal.    ED Course  Procedures (including critical care time) Labs Review Labs Reviewed  CBC WITH DIFFERENTIAL  BASIC METABOLIC PANEL  TROPONIN I  URINALYSIS, ROUTINE W REFLEX MICROSCOPIC    Imaging Review No results found.   EKG Interpretation   Date/Time:  Tuesday February 16 2014 07:20:53 EDT Ventricular Rate:  80 PR Interval:  128 QRS Duration: 90 QT Interval:  342 QTC Calculation: 394 R Axis:   56 Text Interpretation:  Normal sinus rhythm Normal ECG Confirmed by POLLINA   MD, CHRISTOPHER ([id number]) on 02/16/2014 7:27:45 AM      MDM   Final diagnoses:  None   palpitations  Patient presents to ER for evaluation of palpitations. Patient does have a history of palpitations as well as anxiety. She has followup scheduled with her cardiologist next week. Her doctor has increased her atenolol for these symptoms and prescribed Xanax. She has not had any arrhythmia here in the ER. Her workup was otherwise unremarkable. Blood pressure is normal, heart rate is normal. Patient reassured, will followup with her cardiologist, can consider outpatient monitoring at that time. The changes necessary at this time. She is already taking 1-1/2 atenolol tablets a day. She can take an additional half tablet during the course of the day for increased symptoms.    Orpah Greek, MD 02/16/14 [phone]

## 2014-02-16 NOTE — Discharge Instructions (Signed)

## 2014-02-16 NOTE — ED Notes (Signed)
Pt c/o palpitations; pt sts hx of same from nerves; pt sts home meds not helping; pt denies pain

## 2014-02-22 ENCOUNTER — Encounter: Payer: Self-pay | Admitting: Interventional Cardiology

## 2014-02-22 ENCOUNTER — Ambulatory Visit (INDEPENDENT_AMBULATORY_CARE_PROVIDER_SITE_OTHER): Payer: BC Managed Care – PPO | Admitting: Interventional Cardiology

## 2014-02-22 VITALS — BP 110/70 | HR 69 | Ht 64.0 in | Wt 198.0 lb

## 2014-02-22 DIAGNOSIS — E785 Hyperlipidemia, unspecified: Secondary | ICD-10-CM

## 2014-02-22 DIAGNOSIS — E669 Obesity, unspecified: Secondary | ICD-10-CM

## 2014-02-22 DIAGNOSIS — R002 Palpitations: Secondary | ICD-10-CM

## 2014-02-22 DIAGNOSIS — I10 Essential (primary) hypertension: Secondary | ICD-10-CM

## 2014-02-22 MED ORDER — ATENOLOL 50 MG PO TABS: ORAL_TABLET | ORAL | Status: DC

## 2014-02-22 NOTE — Progress Notes (Signed)
Patient ID: Madison Delgado, female   DOB: 1960/05/15, 53 y.o.   MRN: [id number] Patient ID: Madison Delgado, female   DOB: [birth year], 53 y.o.   MRN: [id number]    [address], [place], [place]  [address] Phone: [phone] Fax:  [phone]  Date:  02/22/2014   ID:  Madison Delgado, DOB Jul 15, 1960, MRN [id number]  PCP:  Wenda Low, MD      History of Present Illness: Madison Delgado is a 53 y.o. female **who has had palpitations. She was diagnosed with inappropriate sinus tachycardia at [facility] back in 2005. She has been on beta blockers since that time. She had some palpitations.This improved greatly with taking her medicines regularly.  She last had an echocardiogram in 2013 which showed normal left ventricular function.  In 2005, she was instructed to try to improve her exercise tolerance and to reduce caffeine intake. She does avoid caffeine. She is trying to get back into more exercise. No chest pain. Occasional DOE.  No chest pain. No syncope.  Went to ER for palpitations.  ECG showed NSR.  Sx better after 2 days of Atenolol 75 mg or two days.    Wt Readings from Last 3 Encounters:  02/22/14 198 lb (89.812 kg)  04/22/13 190 lb (86.183 kg)  04/22/13 190 lb (86.183 kg)     Past Medical History  Diagnosis Date  . Palpitations   . Hypertension   . Hyperlipidemia   . Obesity   . Postmenopausal bleeding 04/22/2013  . Fibroids 04/22/2013    Current Outpatient Prescriptions  Medication Sig Dispense Refill  . ALPRAZolam (XANAX) 0.5 MG tablet Take 0.5 mg by mouth 2 (two) times daily as needed for anxiety. For sleep      . atenolol (TENORMIN) 50 MG tablet Take 50 mg by mouth daily.       . Calcium Carb-Cholecalciferol (CALCIUM 500 +D PO) Take 1 tablet by mouth daily.      . fluticasone (FLONASE) 50 MCG/ACT nasal spray Place 2 sprays into both nostrils daily as needed for allergies or rhinitis.      . Multiple Vitamin (MULTIVITAMIN) capsule Take 1  capsule by mouth daily.       No current facility-administered medications for this visit.    Allergies:    Allergies  Allergen Reactions  . Pork Allergy Swelling    Certain types of pork products cause lips to swell and SOB    Social History:  The patient  reports that she has quit smoking. She has never used smokeless tobacco. She reports that she does not drink alcohol or use illicit drugs.   Family History:  The patient's family history includes Coronary artery disease in her father; Diabetes in her mother.   ROS:  Please see the history of present illness.  No nausea, vomiting.  No fevers, chills.  No focal weakness.  No dysuria.    All other systems reviewed and negative.   PHYSICAL EXAM: VS:  BP 110/70  Pulse 69  Ht 5\' 4"  (1.626 m)  Wt 198 lb (89.812 kg)  BMI 33.97 kg/m2 Well nourished, well developed, in no acute distress HEENT: normal Neck: no JVD, no carotid bruits Cardiac:  normal S1, S2; RRR;  Lungs:  clear to auscultation bilaterally, no wheezing, rhonchi or rales Abd: soft, nontender, no hepatomegaly Ext: no edema Skin: warm and dry Neuro:   no focal abnormalities noted  EKG:  NSR, no significnant  ST segment changes  ASSESSMENT AND PLAN:  Palpitations  Continue Atenolol Tablet Extended Release 24 Hour, 50 MG, 1 tablet, Orally, Once a day, can go up to 75 or 100 mg as needed Symptoms improved. I recommended that she continue to try to avoid caffeine.  Let us know if symptoms get worse.  Diagnosed with inappropriate sinus tachycardia back in 2005 at [facility]. She was on as high a dose of 100 mg of Toprol at that time.    Screening for lipid disorders: LDL < 70, HDL 44. In July 2013. Continues to try to improve her diet. LDL 77 in 2015.  Obesity: Stresed importance of diet control and exercise to help lose weight. She thinks it wil be 4-6 post op before she can exercise. She wants to join a walking group.  Signed, Mina Marble, MD, [facility] 02/22/2014  3:55 PM

## 2014-02-22 NOTE — Patient Instructions (Signed)
Your physician has recommended you make the following change in your medication:   1. Continue Atenolol 50 mg 1 tablet daily. You can take an extra 1/2-1 tablet as needed daily for palpitations.   Your physician wants you to follow-up in: 1 year with Dr. Irish Lack. You will receive a reminder letter in the mail two months in advance. If you don't receive a letter, please call our office to schedule the follow-up appointment.

## 2014-02-26 ENCOUNTER — Other Ambulatory Visit: Payer: Self-pay

## 2014-08-11 ENCOUNTER — Other Ambulatory Visit: Payer: Self-pay

## 2014-08-11 DIAGNOSIS — Z1231 Encounter for screening mammogram for malignant neoplasm of breast: Secondary | ICD-10-CM

## 2014-09-06 ENCOUNTER — Ambulatory Visit
Admission: RE | Admit: 2014-09-06 | Discharge: 2014-09-06 | Disposition: A | Discharge status: Home / Self Care | Payer: BLUE CROSS/BLUE SHIELD | Source: Ambulatory Visit

## 2014-09-06 DIAGNOSIS — Z1231 Encounter for screening mammogram for malignant neoplasm of breast: Secondary | ICD-10-CM

## 2014-12-30 ENCOUNTER — Telehealth: Payer: Self-pay | Admitting: Interventional Cardiology

## 2014-12-30 ENCOUNTER — Encounter: Payer: Self-pay | Admitting: Interventional Cardiology

## 2015-03-01 NOTE — Telephone Encounter (Signed)
error 

## 2015-05-11 ENCOUNTER — Ambulatory Visit (INDEPENDENT_AMBULATORY_CARE_PROVIDER_SITE_OTHER): Payer: BLUE CROSS/BLUE SHIELD | Admitting: Physician Assistant

## 2015-05-11 ENCOUNTER — Encounter: Payer: Self-pay | Admitting: Physician Assistant

## 2015-05-11 VITALS — BP 164/100 | HR 85 | Ht 63.0 in | Wt 200.0 lb

## 2015-05-11 DIAGNOSIS — R002 Palpitations: Secondary | ICD-10-CM | POA: Diagnosis not present

## 2015-05-11 MED ORDER — ATENOLOL 50 MG PO TABS: ORAL_TABLET | ORAL | Status: DC

## 2015-05-11 NOTE — Patient Instructions (Signed)
Your physician recommends that you schedule a follow-up appointment in: 2 Months with Dr Irish Lack  Your physician has recommended you make the following change in your medication: Increase Atenolol 1 tablets in the morning, 1/2 tablets in the evening and 1/2 tablet daily as needed for palpitation. It is ok to take over the counter Magnesium

## 2015-05-11 NOTE — Progress Notes (Signed)
Cardiology Office Note   Date:  05/11/2015   ID:  [name], DOB 1961-04-17, MRN [id number]  PCP:  Wenda Low, MD  Cardiologist:  Dr Gwynne Edinger, PA-C   Chief Complaint  Patient presents with  . Follow-up    palpitations//pt wants to ask about taking magnesium//pt c/o SOB because her nose stays clogged up, mild swelling in feet when she travels.    History of Present Illness: Madison Delgado is a 54 y.o. female with a history of inappropriate ST (dx [facility] on BB, nl EF by echo, HTN, HL, obesity  Madison Delgado presents for additional treatment of her palpitations and evaluation of magnesium as a possible supplement.  Ms. Madison Delgado gets palpitations on a regular basis. She will get them in stressful situations. She feels her heart rate accelerating and that will make her feel anxious. She feels like she is going to die from the palpitations although she admits she has been told by multiple physicians that she will not. The palpitations have been happening more frequently lately because of stresses related to her son's health and the holidays.   When she gets the palpitations, she well try to relax and calm herself down and this does seem to help. She also takes Xanax periodically and an extra half tablet of her atenolol which also seems to help.  She has read about magnesium as a supplement that might help and purchased some. She wonders if this will help.   Past Medical History  Diagnosis Date  . Palpitations   . Hypertension   . Hyperlipidemia   . Obesity   . Postmenopausal bleeding 04/22/2013  . Fibroids 04/22/2013    Past Surgical History  Procedure Laterality Date  . Carpal tunnel release    . Robotic assisted total hysterectomy N/A 04/22/2013    Procedure: ROBOTIC ASSISTED TOTAL HYSTERECTOMY;  Surgeon: Elveria Royals, MD;  Location: [facility];  Service: Gynecology;  Laterality: N/A;  3 1/2 hrs.  . Bilateral salpingectomy Bilateral  04/22/2013    Procedure: BILATERAL SALPINGECTOMY;  Surgeon: Elveria Royals, MD;  Location: [facility];  Service: Gynecology;  Laterality: Bilateral;    Current Outpatient Prescriptions  Medication Sig Dispense Refill  . ALPRAZolam (XANAX) 0.5 MG tablet Take 0.5 mg by mouth 2 (two) times daily as needed for anxiety. For sleep    . atenolol (TENORMIN) 50 MG tablet 1 tablet daily and an extra 1/2-1 tablet as needed for palpiations    . Calcium Carb-Cholecalciferol (CALCIUM 500 +D PO) Take 1 tablet by mouth daily.    . fluticasone (FLONASE) 50 MCG/ACT nasal spray Place 2 sprays into both nostrils daily as needed for allergies or rhinitis.    . Multiple Vitamin (MULTIVITAMIN) capsule Take 1 capsule by mouth daily.     No current facility-administered medications for this visit.    Allergies:   Pork allergy    Social History:  The patient  reports that she has quit smoking. She has never used smokeless tobacco. She reports that she does not drink alcohol or use illicit drugs.   Family History:  The patient's family history includes Coronary artery disease in her father; Diabetes in her mother.    ROS:  Please see the history of present illness. All other systems are reviewed and negative.    PHYSICAL EXAM: VS:  BP 164/100 mmHg  Pulse 85  Ht 5\' 3"  (1.6 m)  Wt 200 lb (90.719 kg)  BMI 35.44 kg/m2 , BMI  Body mass index is 35.44 kg/(m^2). GEN: Well nourished, well developed, female in no acute distress HEENT: normal for age  Neck: no JVD, no carotid bruit, no masses Cardiac: RRR; no murmur, no rubs, or gallops Respiratory:  clear to auscultation bilaterally, normal work of breathing GI: soft, nontender, nondistended, + BS MS: no deformity or atrophy; no edema; distal pulses are 2+ in all 4 extremities  Skin: warm and dry, no rash Neuro:  Strength and sensation are intact Psych: euthymic mood, full affect   EKG:  EKG is ordered today. The ekg ordered today demonstrates sinus rhythm,  rate 85, no acute ischemic changes and normal intervals   Recent Labs: No results found for requested labs within last 365 days.    Lipid Panel No results found for: CHOL, TRIG, HDL, CHOLHDL, VLDL, LDLCALC, LDLDIRECT   Wt Readings from Last 3 Encounters:  05/11/15 200 lb (90.719 kg)  02/22/14 198 lb (89.812 kg)  04/22/13 190 lb (86.183 kg)     Other studies Reviewed: Additional studies/ records that were reviewed today include: Previous office notes and other records.  ASSESSMENT AND PLAN:  1.  Inappropriate sinus tachycardia: She is having more palpitations. She is on atenolol 50 mg daily and takes an extra half tablet as needed. However, she is having more palpitations recently. We will increase her beta blocker and it is still okay for her to take an extra half tablet as needed.   I discussed biofeedback with her and suggested she research this as a way to limit the arrhythmia.  I advised that magnesium helps some kind of arrhythmia but not the one she has. I also advised that the amount of medication is not regulated by the FDA but it is a low dose. I did not think it would harm her as the dose is low. If she wishes to take it per the bottle directions, that is okay.  2. Hypertension: Her blood pressure is elevated today. She states that she feels it is elevated only because of her son's illness but I suspectHer blood pressure runs higher than normal most of the time. We will increase her beta blocker as described above. She may need 50 mg twice a day or the addition of another medication to get better blood pressure control.   Current medicines are reviewed at length with the patient today.  The patient does not have concerns regarding medicines.  The following changes have been made:  Increase beta blocker  Labs/ tests ordered today include:  No orders of the defined types were placed in this encounter.     Disposition:   FU wiDr. Lynnette Caffey   05/11/2015 8:42 AM    [facility] [address], [place], [place]  [address] Phone: [phone]; Fax: [phone]

## 2015-07-17 ENCOUNTER — Encounter (HOSPITAL_COMMUNITY): Payer: Self-pay | Admitting: Emergency Medicine

## 2015-07-17 ENCOUNTER — Emergency Department (HOSPITAL_COMMUNITY)
Admission: EM | Admit: 2015-07-17 | Discharge: 2015-07-17 | Disposition: A | Discharge status: Home / Self Care | Payer: BLUE CROSS/BLUE SHIELD | Attending: Emergency Medicine | Admitting: Emergency Medicine

## 2015-07-17 DIAGNOSIS — I10 Essential (primary) hypertension: Secondary | ICD-10-CM | POA: Insufficient documentation

## 2015-07-17 DIAGNOSIS — Z7952 Long term (current) use of systemic steroids: Secondary | ICD-10-CM | POA: Diagnosis not present

## 2015-07-17 DIAGNOSIS — Z87891 Personal history of nicotine dependence: Secondary | ICD-10-CM | POA: Diagnosis not present

## 2015-07-17 DIAGNOSIS — H43391 Other vitreous opacities, right eye: Secondary | ICD-10-CM | POA: Diagnosis not present

## 2015-07-17 DIAGNOSIS — E669 Obesity, unspecified: Secondary | ICD-10-CM | POA: Diagnosis not present

## 2015-07-17 DIAGNOSIS — Z86018 Personal history of other benign neoplasm: Secondary | ICD-10-CM | POA: Diagnosis not present

## 2015-07-17 DIAGNOSIS — H5711 Ocular pain, right eye: Secondary | ICD-10-CM | POA: Diagnosis present

## 2015-07-17 DIAGNOSIS — H579 Unspecified disorder of eye and adnexa: Secondary | ICD-10-CM

## 2015-07-17 DIAGNOSIS — R0981 Nasal congestion: Secondary | ICD-10-CM | POA: Diagnosis not present

## 2015-07-17 NOTE — ED Notes (Signed)
Pt A&OX4, ambulatory at d/c with steady gait, NAD and states she has all of her belongings with her at d/c 

## 2015-07-17 NOTE — ED Notes (Signed)
Pt reports she had cataract surgery in December and about 30 mins ago she started having flashes of light to same eye (R). Pupils equal and reactive.

## 2015-07-17 NOTE — ED Notes (Signed)
Pt also sts that she saw floaters in R eye on Friday and is seeing floaters again now.

## 2015-07-17 NOTE — ED Provider Notes (Addendum)
CSN: [id number]     Arrival date & time 07/17/15  0013 History  By signing my name below, I, Madison Delgado, attest that this documentation has been prepared under the direction and in the presence of Lajean Saver, MD . Electronically Signed: Rowan Delgado, Scribe. 07/17/2015. 1:09 AM.   Chief Complaint  Patient presents with  . Eye Pain   The history is provided by the patient.   HPI Comments:  Madison Delgado is a 55 y.o. female with PMHx of HTN, HLD, and cataracts who presents to the Emergency Department complaining of moderate right eye floaters and small silver flashes in upper side of right eye onset while driving home from work. Symptoms occur intermittently.  She notes she had been looking at monitors throughout her shift at work. Pt reports associated new floaters onset two days ago and nasal congestion. No alleviating factors noted. Pt had cataract surgery Dec 2016 in right eye; she also reports intermittent floaters in right eye from a young age. She was trying to decrease the steroid drops post surgery and experienced inflammation, pain, and light sensitivity. She saw her eye doctor last week and it was recommended she continue using steroid drops. Pt indicates has been on the steroid eye drops ever since her surgery in December.  Pt denies migraines, "black shade" in vision/AF, visual field cut, current vision problems, eye trauma, debris in eye, floaters or flashes in left eye, h/o DM, or any h/o of other eye problems.  No change in light or dark room.      Past Medical History  Diagnosis Date  . Palpitations   . Hypertension   . Hyperlipidemia   . Obesity   . Postmenopausal bleeding 04/22/2013  . Fibroids 04/22/2013   Past Surgical History  Procedure Laterality Date  . Carpal tunnel release    . Robotic assisted total hysterectomy N/A 04/22/2013    Procedure: ROBOTIC ASSISTED TOTAL HYSTERECTOMY;  Surgeon: Elveria Royals, MD;  Location: [facility];  Service: Gynecology;   Laterality: N/A;  3 1/2 hrs.  . Bilateral salpingectomy Bilateral 04/22/2013    Procedure: BILATERAL SALPINGECTOMY;  Surgeon: Elveria Royals, MD;  Location: [facility];  Service: Gynecology;  Laterality: Bilateral;   Family History  Problem Relation Age of Onset  . Diabetes Mother   . Coronary artery disease Father    Social History  Substance Use Topics  . Smoking status: Former Research scientist (life sciences)  . Smokeless tobacco: Never Used  . Alcohol Use: No   OB History    No data available     Review of Systems  Constitutional: Negative for fever and chills.  HENT: Positive for congestion. Negative for sinus pressure.   Eyes: Negative for photophobia and redness.       Flashers and floaters, intermittent.   Gastrointestinal: Negative for nausea and vomiting.  Skin: Negative for rash.  Neurological: Negative for speech difficulty, weakness, numbness and headaches.  All other systems reviewed and are negative.  Allergies  Pork allergy  Home Medications   Prior to Admission medications   Medication Sig Start Date End Date Taking? Authorizing Provider  ALPRAZolam Duanne Moron) 0.5 MG tablet Take 0.5 mg by mouth 2 (two) times daily as needed for anxiety or sleep.    Yes Historical Provider, MD  atenolol (TENORMIN) 50 MG tablet 1 tablet in AM and 1/2 tablets in PM and an extra 1/2 tablet as needed for palpiations Patient taking differently: Take 50 mg by mouth daily.  05/11/15  Yes Suanne Marker  G Barrett, PA-C  prednisoLONE acetate (PRED FORTE) 1 % ophthalmic suspension Place 1 drop into the right eye every other day.   Yes Historical Provider, MD   BP 171/105 mmHg  Pulse 94  Temp(Src) 98.1 F (36.7 C) (Oral)  Resp 22  Ht 5\' 3"  (1.6 m)  Wt 202 lb (91.627 kg)  BMI 35.79 kg/m2  SpO2 100% Physical Exam  Constitutional: She is oriented to person, place, and time. She appears well-developed and well-nourished. No distress.  HENT:  Head: Normocephalic and atraumatic.  Right Ear: Hearing normal.  Left Ear:  Hearing normal.  Nose: Nose normal.  Mouth/Throat: Oropharynx is clear and moist and mucous membranes are normal.  Eyes: Conjunctivae and EOM are normal. Pupils are equal, round, and reactive to light. Right eye exhibits no discharge. Left eye exhibits no discharge. No scleral icterus.  No conjunctivitis. No erythema. No chemosis. No fb seen. Pupils 3-4 mm, equal bil, briskly reactive. No corneal clouding. No pain w eom. No orbital or periorbital swelling or redness. Fundus, sharp disc, no PE noted. Limited/non dilated exam, no acute hem or retinal detachment noted. No hyphema.   Neck: Normal range of motion. Neck supple. No thyromegaly present.  No bruit.  Cardiovascular: Regular rhythm, S1 normal, S2 normal and intact distal pulses.   Pulmonary/Chest: Effort normal. No respiratory distress.  Abdominal: Normal appearance. There is no hepatosplenomegaly. There is no tenderness at McBurney's point and negative Murphy's sign. No hernia.  Musculoskeletal: Normal range of motion.  Lymphadenopathy:    She has no cervical adenopathy.  Neurological: She is alert and oriented to person, place, and time. She has normal strength. No sensory deficit. GCS eye subscore is 4. GCS verbal subscore is 5. GCS motor subscore is 6.  Visual acuity noted.   Skin: Skin is warm, dry and intact. No rash noted. She is not diaphoretic. No cyanosis.  Psychiatric: She has a normal mood and affect. Her speech is normal and behavior is normal.  Nursing note and vitals reviewed.   ED Course  Procedures  DIAGNOSTIC STUDIES:  Oxygen Saturation is 100% on RA, normal by my interpretation.    COORDINATION OF CARE:  1:12 AM Discussed treatment plan with pt at bedside and pt agreed to plan.    MDM   I personally performed the services described in this documentation, which was scribed in my presence. The recorded information has been reviewed and considered. Lajean Saver, MD  Pt indicates symptoms currently improved,  and no eye pain, no loss of vision.   Reviewed nursing notes and prior charts for additional history. Nursing check of pts visual acuity noted, similar bil eyes.   Denies eye pain or change in vision, notes floaters/flashers earlier, now improved.  Discussed diff dx, incl retinal/vitreous detach, hem, etc  Pt indicates her eye doctor is Dr Katy Fitch - will have contact him in AM today for check then.   Symptoms remain improved, no pain, no loss of vision -  currently appears stable for d/c from ED. Recheck bp 122/79.   Return precautions provided.       Lajean Saver, MD 07/17/15 [phone]

## 2015-07-17 NOTE — Discharge Instructions (Signed)
It was our pleasure to provide your ER care today - we hope that you feel better.  Follow up with Dr Katy Fitch within the next day - call him this morning to discuss current symptoms, and to arrange time for them to see you in the next day.  Return to ER if worse, new symptoms, severe eye pain, redness/swelling, loss of vision, other concern.  Your blood pressure was high tonight - follow up with primary care doctor for recheck in coming week.       Eye Floaters Eye floaters are specks of material that float around inside your eye. A jelly-like fluid (vitreous) fills the inside of your eye. The vitreous is normally clear. It allows light to pass through to tissues at the back of the eye (retina). The retina contains the nerves needed for vision.  Your vitreous can start to shrink and become stringy as you age. Strands of material may start to float around inside the eye. They come from clumps of cells, blood, or other materials. These objects cast shadows on the retina and show up as floaters. Floaters may be more obvious when you look up at the sky or at a bright, blank background. They do not go away completely. In time, however, they may settle below your line of sight. Floaters can be annoying. They do not usually cause vision problems. Sometimes floaters appear along with flashes. Flashes look like bright, quick streaks of light. They usually occur at the edge of your vision. Flashes result when your vitreous pulls on your retina. They also occur with age. However, they could be a warning sign of a detached retina. This is a serious condition that requires emergency treatment to prevent vision loss. CAUSES  For most people, eye floaters develop when the vitreous begins to shrink as a normal part of aging. More serious causes of floaters include:  A torn retina.  Injury.   Bleeding inside the eye. Diabetes and other conditions can cause broken retinal blood vessels.  A blood clot in the  major vein of the retina or its branches (retinal vein occlusion).  Retinal detachment.   Vitreous detachment.   Inflammation inside the eye (uveitis).   Infection inside the eye. RISK FACTORS You may have a higher risk for floaters if:   You are older.  You are nearsighted.  You have diabetes.  You have had cataracts removed. SIGNS AND SYMPTOMS  Symptoms of floaters include seeing small, shadowy shapes move across your vision. They move as your eyes move. They drift out of your vision when you keep your eyes still. These shapes may look like:  Specks.  Dots.  Circles.  Squiggly lines.  Thread. Symptoms of flashes include seeing:  Bursts of light.  Flashing lights.  Lightning streaks.  What is commonly referred to as "stars." DIAGNOSIS  Your health care provider may diagnose floaters and flashes based on your symptoms. You may need to see an eye care specialist (optometrist or ophthalmologist). The specialist will do an exam to determine whether your floaters are a normal part of aging or a warning sign of a more serious eye problem. The specialist may put drops in your eyes to open your pupils wide (dilate) and then use a special scope (slit lamp) to look inside your eye.  TREATMENT  No treatment is needed for floaters that occur normally with age. Sometimes floaters become severe enough to affect your vision. In rare cases, surgery to remove the vitreous and replace it  with a saltwater solution (vitrectomy) may be considered. HOME CARE INSTRUCTIONS Keep all follow-up visits as directed by your health care provider. This is important.  SEEK MEDICAL CARE IF:   You have a sudden increase in floaters.  You have floaters along with flashes.  You have floaters along with any new eye symptoms. SEEK IMMEDIATE MEDICAL CARE IF:   You have a sudden increase in floaters or flashes that interferes with your vision.  Your vision suddenly changes.   This information  is not intended to replace advice given to you by your health care provider. Make sure you discuss any questions you have with your health care provider.   Document Released: 05/03/2003 Document Revised: 05/21/2014 Document Reviewed: 12/23/2013 Elsevier Interactive Patient Education [employer].

## 2015-07-18 NOTE — Progress Notes (Signed)
Patient ID: Madison Delgado, female   DOB: 09-Feb-1961, 55 y.o.   MRN: [id number]     Cardiology Office Note   Date:  07/19/2015   ID:  Madison Delgado, DOB 23-Mar-1961, MRN [id number]  PCP:  Madison Low, MD    No chief complaint on file.  palpitations  Wt Readings from Last 3 Encounters:  07/19/15 198 lb 12.8 oz (90.175 kg)  07/17/15 202 lb (91.627 kg)  05/11/15 200 lb (90.719 kg)       History of Present Illness: Madison Delgado is a 55 y.o. female  with a history of inappropriate ST (dx [facility]) on BB, nl EF by echo, HTN, HL, obesity  In December, she presented for treatment of her palpitations and evaluation of magnesium as a possible supplement.  Ms. Madison Delgado gets palpitations on a regular basis. She will get them in stressful situations. She feels her heart rate accelerating and that will make her feel anxious. She feels like she is going to die from the palpitations although she admits she has been told by multiple physicians that she will not. The palpitations have been happening more frequently lately because of stresses related to her son's health and the holidays.   When she gets the palpitations, she well try to relax and calm herself down and this does seem to help. She also takes Xanax periodically and an extra half tablet of her atenolol which also seems to help.  She has read about magnesium as a supplement that might help and purchased some. She wonders if this will help.   She was told to increase her beta blocker to 1.5 pills.  SHe saw Dr. Lysle Delgado who felt this was more related to stress.  She has some palpitations with bending forward.  He prescribed a new medicine for anxiety. She did not like the side effect profile so she did not take it.  She has been minimizing Xanax.    Past Medical History  Diagnosis Date  . Palpitations   . Hypertension   . Hyperlipidemia   . Obesity   . Postmenopausal bleeding 04/22/2013  . Fibroids 04/22/2013    Past Surgical  History  Procedure Laterality Date  . Carpal tunnel release    . Robotic assisted total hysterectomy N/A 04/22/2013    Procedure: ROBOTIC ASSISTED TOTAL HYSTERECTOMY;  Surgeon: Madison Royals, MD;  Location: [facility];  Service: Gynecology;  Laterality: N/A;  3 1/2 hrs.  . Bilateral salpingectomy Bilateral 04/22/2013    Procedure: BILATERAL SALPINGECTOMY;  Surgeon: Madison Royals, MD;  Location: [facility];  Service: Gynecology;  Laterality: Bilateral;     Current Outpatient Prescriptions  Medication Sig Dispense Refill  . ALPRAZolam (XANAX) 0.5 MG tablet Take 0.5 mg by mouth 2 (two) times daily as needed for anxiety or sleep.     Marland Kitchen atenolol (TENORMIN) 50 MG tablet Take 50 mg by mouth daily.    . prednisoLONE acetate (PRED FORTE) 1 % ophthalmic suspension Place 1 drop into the right eye every other day.     No current facility-administered medications for this visit.    Allergies:   Pork allergy    Social History:  The patient  reports that she has quit smoking. She has never used smokeless tobacco. She reports that she does not drink alcohol or use illicit drugs.   Family History:  The patient's family history includes Coronary artery disease in her father; Diabetes in her mother; Heart attack in her father; Hypertension in her mother  and sister; Stroke in her mother.    ROS:  Please see the history of present illness.   Otherwise, review of systems are positive for anxiety, .   All other systems are reviewed and negative.    PHYSICAL EXAM: VS:  BP 130/82 mmHg  Pulse 80  Ht 5\' 3"  (1.6 m)  Wt 198 lb 12.8 oz (90.175 kg)  BMI 35.22 kg/m2 , BMI Body mass index is 35.22 kg/(m^2). GEN: Well nourished, well developed, in no acute distress HEENT: normal Neck: no JVD, carotid bruits, or masses Cardiac: RRR; no murmurs, rubs, or gallops,no edema  Respiratory:  clear to auscultation bilaterally, normal work of breathing GI: soft, nontender, nondistended, + BS MS: no deformity or  atrophy Skin: warm and dry, no rash Neuro:  Strength and sensation are intact Psych: euthymic mood, full affect    Recent Labs: No results found for requested labs within last 365 days.   Lipid Panel No results found for: CHOL, TRIG, HDL, CHOLHDL, VLDL, LDLCALC, LDLDIRECT   Other studies Reviewed: Additional studies/ records that were reviewed today with results demonstrating: 2013 echo showed normal LV function.   ASSESSMENT AND PLAN:  1.  Inappropriate sinus tachycardia: She is having more palpitations, but I agree with Dr. Lysle Delgado that this is more likely related to anxiety. She cancan take  atenolol 50 mg daily and take an extra half tablet as needed.   HR controlled at this time.   COntinue to try deep breathing as well and slowing down to help sx.   I advised that magnesium helps some kind of more serious arrhythmias, typically IV in the hospital. She took it and felt no no difference.  2. Hypertension: Her blood pressure is controlled today. It has been running in the [id number] systolic at home.  Minimizing salt and walking more will help.  Time is limited by her higher education.  She eventually wants to be a Education officer, museum.    Hyperlipidemia: managed by Dr. Lysle Delgado.   Current medicines are reviewed at length with the patient today.  The patient concerns regarding her medicines were addressed.  The following changes have been made:  No change  Labs/ tests ordered today include:  No orders of the defined types were placed in this encounter.    Recommend 150 minutes/week of aerobic exercise Delgado fat, Delgado carb, high fiber diet recommended  Disposition:   FU in 1 year   Teresita Madura., MD  07/19/2015 10:41 AM    [facility] [place], [place], [place]  [address] Phone: [phone]; Fax: [phone]

## 2015-07-19 ENCOUNTER — Ambulatory Visit (INDEPENDENT_AMBULATORY_CARE_PROVIDER_SITE_OTHER): Payer: BLUE CROSS/BLUE SHIELD | Admitting: Interventional Cardiology

## 2015-07-19 ENCOUNTER — Encounter: Payer: Self-pay | Admitting: Interventional Cardiology

## 2015-07-19 VITALS — BP 130/82 | HR 80 | Ht 63.0 in | Wt 198.8 lb

## 2015-07-19 DIAGNOSIS — R002 Palpitations: Secondary | ICD-10-CM

## 2015-07-19 DIAGNOSIS — I10 Essential (primary) hypertension: Secondary | ICD-10-CM

## 2015-07-19 NOTE — Patient Instructions (Signed)
**Note De-identified Madison Delgado Obfuscation** Medication Instructions:  Same-no changes  Labwork: None  Testing/Procedures: None  Follow-Up: Your physician wants you to follow-up in: 1 year. You will receive a reminder letter in the mail two months in advance. If you don't receive a letter, please call our office to schedule the follow-up appointment.      If you need a refill on your cardiac medications before your next appointment, please call your pharmacy.   

## 2015-08-23 ENCOUNTER — Other Ambulatory Visit: Payer: Self-pay

## 2015-08-23 DIAGNOSIS — Z1231 Encounter for screening mammogram for malignant neoplasm of breast: Secondary | ICD-10-CM

## 2015-09-09 ENCOUNTER — Ambulatory Visit: Payer: BLUE CROSS/BLUE SHIELD

## 2015-09-12 ENCOUNTER — Ambulatory Visit: Payer: BLUE CROSS/BLUE SHIELD | Admitting: Physician Assistant

## 2015-09-14 ENCOUNTER — Ambulatory Visit
Admission: RE | Admit: 2015-09-14 | Discharge: 2015-09-14 | Disposition: A | Discharge status: Home / Self Care | Payer: BLUE CROSS/BLUE SHIELD | Source: Ambulatory Visit

## 2015-09-14 DIAGNOSIS — Z1231 Encounter for screening mammogram for malignant neoplasm of breast: Secondary | ICD-10-CM

## 2015-09-22 ENCOUNTER — Encounter: Payer: Self-pay | Admitting: Physician Assistant

## 2015-09-22 ENCOUNTER — Ambulatory Visit (INDEPENDENT_AMBULATORY_CARE_PROVIDER_SITE_OTHER): Payer: BLUE CROSS/BLUE SHIELD | Admitting: Physician Assistant

## 2015-09-22 VITALS — BP 138/70 | HR 94 | Ht 63.0 in | Wt 198.8 lb

## 2015-09-22 DIAGNOSIS — R0789 Other chest pain: Secondary | ICD-10-CM | POA: Diagnosis not present

## 2015-09-22 DIAGNOSIS — E785 Hyperlipidemia, unspecified: Secondary | ICD-10-CM

## 2015-09-22 DIAGNOSIS — I10 Essential (primary) hypertension: Secondary | ICD-10-CM

## 2015-09-22 DIAGNOSIS — R002 Palpitations: Secondary | ICD-10-CM

## 2015-09-22 NOTE — Progress Notes (Signed)
Cardiology Office Note:    Date:  09/22/2015   ID:  [name], DOB 02/08/1961, MRN [id number]  PCP:  Wenda Low, MD  Cardiologist:  Dr. Casandra Doffing   Electrophysiologist:  n/a  Referring MD: Wenda Low, MD   Chief Complaint  Patient presents with  . Chest Pain    History of Present Illness:     MARICA TENNER is a 55 y.o. female with a hx of Inappropriate sinus tachycardia (diagnosed at [facility]), HTN, HL, obesity. She has frequent palpitations last seen by Dr. Irish Lack 3/17. She is maintained on beta-blocker Rx.   She returns today for evaluation of chest discomfort. She had fairly constant chest discomfort for a couple of weeks. It was on her right and left side underneath her breasts. She noticed it more with bending over. She denies exertional symptoms. She denies associated dyspnea, radiating symptoms. She denies orthopnea, PND or edema. Denies syncope. She took Mylanta for few days and has not had any further symptoms in the past 2 weeks.  She has 2 sons graduating from [employer] this weekend.  One is getting a Masters in Estate manager/land agent and the younger son is getting getting a BA in sports medicine.     Past Medical History  Diagnosis Date  . Palpitations   . Hypertension   . Hyperlipidemia   . Obesity   . Postmenopausal bleeding 04/22/2013  . Fibroids 04/22/2013    Past Surgical History  Procedure Laterality Date  . Carpal tunnel release    . Robotic assisted total hysterectomy N/A 04/22/2013    Procedure: ROBOTIC ASSISTED TOTAL HYSTERECTOMY;  Surgeon: Elveria Royals, MD;  Location: [facility];  Service: Gynecology;  Laterality: N/A;  3 1/2 hrs.  . Bilateral salpingectomy Bilateral 04/22/2013    Procedure: BILATERAL SALPINGECTOMY;  Surgeon: Elveria Royals, MD;  Location: [facility];  Service: Gynecology;  Laterality: Bilateral;    Current Medications: Outpatient Prescriptions Prior to Visit  Medication Sig Dispense Refill  .  ALPRAZolam (XANAX) 0.5 MG tablet Take 0.5 mg by mouth 2 (two) times daily as needed for anxiety or sleep.     Marland Kitchen atenolol (TENORMIN) 50 MG tablet Take 50 mg by mouth daily. May take additional 25 mg (1/2 tablet) daily PRN for palpitations    . prednisoLONE acetate (PRED FORTE) 1 % ophthalmic suspension Place 1 drop into the right eye every other day. Reported on 09/22/2015     No facility-administered medications prior to visit.      Allergies:   Pork allergy   Social History   Social History  . Marital Status: Divorced    Spouse Name: N/A  . Number of Children: N/A  . Years of Education: N/A   Social History Main Topics  . Smoking status: Former Research scientist (life sciences)  . Smokeless tobacco: Never Used  . Alcohol Use: No  . Drug Use: No  . Sexual Activity: Yes    Birth Control/ Protection: None   Other Topics Concern  . 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     Family History:  The patient's family history includes Coronary artery disease in her father; Diabetes in her mother; Heart attack in her father; Hypertension in her mother and sister; Stroke in her mother.   ROS:   Please see the history of present illness.    Review of Systems  Cardiovascular: Positive for irregular heartbeat.   All other systems reviewed and are negative.   Physical Exam:    VS:  BP 138/70 mmHg  Pulse 94  Ht 5\' 3"  (1.6 m)  Wt 198 lb 12.8 oz (90.175 kg)  BMI 35.22 kg/m2   GEN: Well nourished, well developed, in no acute distress HEENT: normal Neck: no JVD, no masses Cardiac: Normal S1/S2, RRR; no murmurs, rubs, or gallops, no edema;  no carotid bruits,   Respiratory:  clear to auscultation bilaterally; no wheezing, rhonchi or rales GI: soft, nontender, nondistended MS: no deformity or atrophy Skin: warm and dry Neuro: No focal deficits  Psych: Alert and oriented x 3, normal affect  Wt Readings from Last 3  Encounters:  09/22/15 198 lb 12.8 oz (90.175 kg)  07/19/15 198 lb 12.8 oz (90.175 kg)  07/17/15 202 lb (91.627 kg)      Studies/Labs Reviewed:     EKG:  EKG is  ordered today.  The ekg ordered today demonstrates NSR, HR 93, normal axis, QTc 395 ms, no change from prior tracing  Recent Labs: No results found for requested labs within last 365 days.   Recent Lipid Panel No results found for: CHOL, TRIG, HDL, CHOLHDL, VLDL, LDLCALC, LDLDIRECT  Additional studies/ records that were reviewed today include:    Echo 6/13 EF 60-65%, LA size upper normal    ASSESSMENT:     1. Other chest pain   2. Palpitations   3. Essential hypertension   4. Hyperlipidemia     PLAN:     In order of problems listed above:  1. Chest pain - Atypical symptoms.  May be related to stress or GI.  CRFs include age, FHx, HTN.  We discussed proceeding with plain ETT and she would like to pursue this.    -  Arrange ETT  -  FU as planned with Dr. Casandra Doffing or sooner if ETT abnormal.  2. Palpitations  - Largely controlled on current regimen.   3. HTN - Borderline control. Continue diet, exercise, weight loss and current medical regimen.  4. HL  -  Managed by primary care.   Medication Adjustments/Labs and Tests Ordered: Current medicines are reviewed at length with the patient today.  Concerns regarding medicines are outlined above.  Medication changes, Labs and Tests ordered today are outlined in the Patient Instructions noted below. Patient Instructions  Medication Instructions:  Your physician recommends that you continue on your current medications as directed. Please refer to the Current Medication list given to you today.  Labwork: None ordered Testing/Procedures: Your physician has requested that you have an exercise tolerance test. For further information please visit [email or web address]. Please also follow instruction sheet, as given. Follow-Up: Follow up with Dr.Varanasi as  planned Any Other Special Instructions Will Be Listed Below (If Applicable). If you need a refill on your cardiac medications before your next appointment, please call your pharmacy.   Signed, Richardson Dopp, PA-C  09/22/2015 3:31 PM    [facility] [place], [place], [place]  [address] Phone: [phone]; Fax: [phone]

## 2015-09-22 NOTE — Patient Instructions (Addendum)
Medication Instructions:  Your physician recommends that you continue on your current medications as directed. Please refer to the Current Medication list given to you today.  Labwork: None ordered Testing/Procedures: Your physician has requested that you have an exercise tolerance test. For further information please visit [email or web address]. Please also follow instruction sheet, as given. Follow-Up: Follow up with Dr.Varanasi as planned Any Other Special Instructions Will Be Listed Below (If Applicable). If you need a refill on your cardiac medications before your next appointment, please call your pharmacy.

## 2015-10-05 ENCOUNTER — Encounter: Payer: Self-pay | Admitting: Physician Assistant

## 2015-10-05 ENCOUNTER — Ambulatory Visit (INDEPENDENT_AMBULATORY_CARE_PROVIDER_SITE_OTHER): Payer: BLUE CROSS/BLUE SHIELD

## 2015-10-05 DIAGNOSIS — R0789 Other chest pain: Secondary | ICD-10-CM

## 2015-10-05 LAB — EXERCISE TOLERANCE TEST
Estimated workload: 10.1 METS
Exercise duration (min): 9 min
Exercise duration (sec): 0 s
MPHR: 153 {beats}/min
Peak BP: 209 mmHg
Peak HR: 151 {beats}/min
Percent HR: 91 %
Percent of predicted max HR: 90 %
RPE: 16
Rest HR: 100 {beats}/min
Stage 1 DBP: 88 mmHg
Stage 1 Grade: 0 %
Stage 1 HR: 93 {beats}/min
Stage 1 SBP: 160 mmHg
Stage 1 Speed: 0 mph
Stage 2 Grade: 0 %
Stage 2 HR: 104 {beats}/min
Stage 2 Speed: 1 mph
Stage 3 Grade: 0.1 %
Stage 3 HR: 103 {beats}/min
Stage 3 Speed: 1 mph
Stage 4 DBP: 84 mmHg
Stage 4 Grade: 10 %
Stage 4 HR: 133 {beats}/min
Stage 4 SBP: 186 mmHg
Stage 4 Speed: 1.7 mph
Stage 5 DBP: 87 mmHg
Stage 5 Grade: 12 %
Stage 5 HR: 141 {beats}/min
Stage 5 SBP: 184 mmHg
Stage 5 Speed: 2.5 mph
Stage 6 DBP: 84 mmHg
Stage 6 Grade: 14 %
Stage 6 HR: 151 {beats}/min
Stage 6 SBP: 209 mmHg
Stage 6 Speed: 3.4 mph
Stage 7 DBP: 77 mmHg
Stage 7 Grade: 0 %
Stage 7 HR: 139 {beats}/min
Stage 7 SBP: 193 mmHg
Stage 7 Speed: 0 mph
Stage 8 DBP: 81 mmHg
Stage 8 Grade: 0 %
Stage 8 HR: 100 {beats}/min
Stage 8 SBP: 159 mmHg
Stage 8 Speed: 0 mph

## 2016-02-26 ENCOUNTER — Encounter (HOSPITAL_COMMUNITY): Payer: Self-pay | Admitting: Emergency Medicine

## 2016-02-26 ENCOUNTER — Emergency Department (HOSPITAL_COMMUNITY)
Admission: EM | Admit: 2016-02-26 | Discharge: 2016-02-26 | Disposition: A | Discharge status: Home / Self Care | Payer: BLUE CROSS/BLUE SHIELD | Attending: Emergency Medicine | Admitting: Emergency Medicine

## 2016-02-26 DIAGNOSIS — I10 Essential (primary) hypertension: Secondary | ICD-10-CM | POA: Diagnosis not present

## 2016-02-26 DIAGNOSIS — J309 Allergic rhinitis, unspecified: Secondary | ICD-10-CM | POA: Diagnosis not present

## 2016-02-26 DIAGNOSIS — Z79899 Other long term (current) drug therapy: Secondary | ICD-10-CM | POA: Insufficient documentation

## 2016-02-26 DIAGNOSIS — Z87891 Personal history of nicotine dependence: Secondary | ICD-10-CM | POA: Diagnosis not present

## 2016-02-26 DIAGNOSIS — R0981 Nasal congestion: Secondary | ICD-10-CM | POA: Diagnosis present

## 2016-02-26 MED ORDER — FLUTICASONE PROPIONATE 50 MCG/ACT NA SUSP: 2.0000 | Freq: Every day | NASAL | 2 refills | Status: DC

## 2016-02-26 NOTE — Discharge Instructions (Signed)
Please read attached information. If you experience any new or worsening signs or symptoms please return to the emergency room for evaluation. Please follow-up with your primary care provider or specialist as discussed. Please use medication prescribed only as directed and discontinue taking if you have any concerning signs or symptoms.   °

## 2016-02-26 NOTE — ED Notes (Signed)
Bed: WTR9 Expected date:  Expected time:  Means of arrival:  Comments: 

## 2016-02-26 NOTE — ED Provider Notes (Signed)
[facility] DEPT Provider Note   CSN: [id number] Arrival date & time: 02/26/16  1117  By signing my name below, I, Maren Reamer, attest that this documentation has been prepared under the direction and in the presence of [employer], PA-C. Electronically Signed: Maren Reamer, Scribe. 02/26/16. 6:19 PM.     History   Chief Complaint Chief Complaint  Patient presents with  . Nasal Congestion    HPI   HPI Comments: Madison Delgado is a 55 y.o. female who presents to the Emergency Department complaining of nasal congestion and sinus pressure that began about 1 week ago. Denies any associated cough, nausea, vomiting, or diarrhea. Feels the pain primarily in the back of her head and had lightheadedness for 1 hour yesterday morning. She went to work, this went away, but she had worsening pain in the back of her head. Took her BP which came back around 180/80. Also noticed that she was Tachycardic but both of these improved once she calmed down. Is on Atenolol for palpitations. Confirms history of allergies for which she takes plain Mucinex and Flonase Nasal Spray. Her ears have been feeling full and her eyes have been watering. Denies numbness, tingling, or weakness. Overall, she says that  The sinus pressure comes and goes and her BP is under control.  Past Medical History:  Diagnosis Date  . Fibroids 04/22/2013  . History of stress test    a. ETT 5/17: normal  . Hyperlipidemia   . Hypertension   . Obesity   . Palpitations   . Postmenopausal bleeding 04/22/2013    Patient Active Problem List   Diagnosis Date Noted  . Postmenopausal bleeding 04/22/2013  . Fibroids 04/22/2013  . S/P hysterectomy 04/22/2013  . Palpitations   . Hypertension   . Hyperlipidemia   . Obesity     Past Surgical History:  Procedure Laterality Date  . BILATERAL SALPINGECTOMY Bilateral 04/22/2013   Procedure: BILATERAL SALPINGECTOMY;  Surgeon: Elveria Royals, MD;  Location: [facility];  Service:  Gynecology;  Laterality: Bilateral;  . CARPAL TUNNEL RELEASE    . ROBOTIC ASSISTED TOTAL HYSTERECTOMY N/A 04/22/2013   Procedure: ROBOTIC ASSISTED TOTAL HYSTERECTOMY;  Surgeon: Elveria Royals, MD;  Location: [facility];  Service: Gynecology;  Laterality: N/A;  3 1/2 hrs.    OB History    No data available       Home Medications    Prior to Admission medications   Medication Sig Start Date End Date Taking? Authorizing Provider  ALPRAZolam Duanne Moron) 0.5 MG tablet Take 0.5 mg by mouth 2 (two) times daily as needed for anxiety or sleep.     Historical Provider, MD  atenolol (TENORMIN) 50 MG tablet Take 50 mg by mouth daily. May take additional 25 mg (1/2 tablet) daily PRN for palpitations    Historical Provider, MD  fluticasone (FLONASE) 50 MCG/ACT nasal spray Place 2 sprays into both nostrils daily. 02/26/16   Okey Regal, PA-C    Family History Family History  Problem Relation Age of Onset  . Diabetes Mother   . Hypertension Mother   . Stroke Mother   . Coronary artery disease Father   . Heart attack Father   . Hypertension Sister     Social History Social History  Substance Use Topics  . Smoking status: Former Research scientist (life sciences)  . Smokeless tobacco: Never Used  . Alcohol use No     Allergies   Pork allergy   Review of Systems Review of Systems  HENT: Positive for  congestion.        Sinus pressure/pain in back of head  Respiratory: Negative for cough.   Gastrointestinal: Negative for diarrhea, nausea and vomiting.  Neurological: Positive for light-headedness. Negative for weakness and numbness (no tingling).  All other systems reviewed and are negative.    Physical Exam Updated Vital Signs BP 139/86   Pulse 64   Temp 97.5 F (36.4 C) (Oral)   Resp 16   Ht 5\' 3"  (1.6 m)   Wt 90.7 kg   SpO2 100%   BMI 35.43 kg/m   Physical Exam  Constitutional: She is oriented to person, place, and time. She appears well-developed and well-nourished.  HENT:  Head: Normocephalic  and atraumatic.  Nose: Nose normal.  Mouth/Throat: Oropharynx is clear and moist. No oropharyngeal exudate.  Eyes: Conjunctivae are normal. Pupils are equal, round, and reactive to light. Right eye exhibits no discharge. Left eye exhibits no discharge. No scleral icterus.  Neck: Normal range of motion. No JVD present. No tracheal deviation present.  Pulmonary/Chest: Effort normal. No stridor.  Neurological: She is alert and oriented to person, place, and time. Coordination normal.  Psychiatric: She has a normal mood and affect. Her behavior is normal. Judgment and thought content normal.  Nursing note and vitals reviewed.    ED Treatments / Results  Labs (all labs ordered are listed, but only abnormal results are displayed) Labs Reviewed - No data to display  EKG  EKG Interpretation None       Radiology No results found.  Procedures Procedures (including critical care time)  Medications Ordered in ED Medications - No data to display   Initial Impression / Assessment and Plan / ED Course  I have reviewed the triage vital signs and the nursing notes.  Pertinent labs & imaging results that were available during my care of the patient were reviewed by me and considered in my medical decision making (see chart for details).  Clinical Course   Labs:  Imaging:  Consults:  Therapeutics:  Discharge Meds: Flonase Nasal Spray  Assessment/Plan:  Discussed with her that her story and PE are consistent with allergies causing her symptoms. She has been provided with a refill for Flonase Nasal Spray, and she should continue on Mucinex and start on Claritin. Also suggested she use a sinus rinse such as a Neti Pot. If this continues without improvement or if she has intense facial pain or fever then she should come back to the ED for a possible bacterial infection.  Regarding her HTN, she likely just had a spike in her BP due to walking around and from stress. She is to continue  on Atenolol at this time. If this gets above [id number] systolic with an intense headache then she should return to the ED.   I personally performed the services described in this documentation, which was scribed in my presence. The recorded information has been reviewed and is accurate.    Final Clinical Impressions(s) / ED Diagnoses   Final diagnoses:  Allergic sinusitis    New Prescriptions Discharge Medication List as of 02/26/2016  2:06 PM    START taking these medications   Details  fluticasone (FLONASE) 50 MCG/ACT nasal spray Place 2 sprays into both nostrils daily., Starting Sun 02/26/2016, Print         Okey Regal, PA-C 02/26/16 [place], MD 02/26/16 2348

## 2016-02-26 NOTE — ED Triage Notes (Signed)
Pt reports sinus pain with nasal congestion x1 week. Denies cough, N/V/D.

## 2016-04-13 HISTORY — PX: CATARACT EXTRACTION: SUR2

## 2016-08-17 ENCOUNTER — Other Ambulatory Visit: Payer: Self-pay | Admitting: Internal Medicine

## 2016-08-17 DIAGNOSIS — Z1231 Encounter for screening mammogram for malignant neoplasm of breast: Secondary | ICD-10-CM

## 2016-09-03 ENCOUNTER — Encounter: Payer: Self-pay | Admitting: Interventional Cardiology

## 2016-09-04 ENCOUNTER — Encounter: Payer: Self-pay | Admitting: Interventional Cardiology

## 2016-09-04 ENCOUNTER — Ambulatory Visit (INDEPENDENT_AMBULATORY_CARE_PROVIDER_SITE_OTHER): Payer: BLUE CROSS/BLUE SHIELD | Admitting: Interventional Cardiology

## 2016-09-04 VITALS — BP 182/100 | HR 100 | Ht 63.0 in | Wt 200.4 lb

## 2016-09-04 DIAGNOSIS — Z90710 Acquired absence of both cervix and uterus: Secondary | ICD-10-CM

## 2016-09-04 DIAGNOSIS — I10 Essential (primary) hypertension: Secondary | ICD-10-CM | POA: Diagnosis not present

## 2016-09-04 DIAGNOSIS — R002 Palpitations: Secondary | ICD-10-CM

## 2016-09-04 NOTE — Patient Instructions (Signed)

## 2016-09-04 NOTE — Progress Notes (Signed)
Patient ID: Madison Delgado, female   DOB: Jan 14, 1961, 56 y.o.   MRN: [id number]     Cardiology Office Note   Date:  09/04/2016   ID:  ELIZAVETA MATTICE, DOB Apr 20, 1961, MRN [id number]  PCP:  Wenda Low, MD    No chief complaint on file.  palpitations  Wt Readings from Last 3 Encounters:  09/04/16 200 lb 6.4 oz (90.9 kg)  02/26/16 200 lb (90.7 kg)  09/22/15 198 lb 12.8 oz (90.2 kg)       History of Present Illness: Madison Delgado is a 56 y.o. female  with a history of inappropriate ST (dx [place]) on BB, nl EF by echo, HTN, HL, obesity   Ms. Bautch gets palpitations on a regular basis- if she gets stressed.  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 She will get them in stressful situations. She feels her heart rate accelerating and that will make her feel anxious.   She feels like she is going to die from the palpitations although she admits she has been told by multiple physicians that she will not- even after all these years, she feels nervous.   When she gets the palpitations, she well try to relax and calm herself down and this does seem to help. She also takes Xanax periodically and an extra half tablet of her atenolol which also seems to help.   She was told to increase her beta blocker to 1 pills. She can take an extra half pill.  This is a rare occurrence.     Past Medical History:  Diagnosis Date  . Fibroids 04/22/2013  . History of stress test    a. ETT 5/17: normal  . Hyperlipidemia   . Hypertension   . Obesity   . Palpitations   . Postmenopausal bleeding 04/22/2013    Past Surgical History:  Procedure Laterality Date  . BILATERAL SALPINGECTOMY Bilateral 04/22/2013   Procedure: BILATERAL SALPINGECTOMY;  Surgeon: Elveria Royals, MD;  Location: [facility];  Service: Gynecology;  Laterality: Bilateral;  . CARPAL TUNNEL RELEASE    . ROBOTIC ASSISTED TOTAL HYSTERECTOMY N/A 04/22/2013   Procedure: ROBOTIC ASSISTED  TOTAL HYSTERECTOMY;  Surgeon: Elveria Royals, MD;  Location: [facility];  Service: Gynecology;  Laterality: N/A;  3 1/2 hrs.     Current Outpatient Prescriptions  Medication Sig Dispense Refill  . ALPRAZolam (XANAX) 0.5 MG tablet Take 0.5 mg by mouth 2 (two) times daily as needed for anxiety or sleep.     Marland Kitchen atenolol (TENORMIN) 50 MG tablet Take 50 mg by mouth daily. May take additional 25 mg (1/2 tablet) daily PRN for palpitations    . fluticasone (FLONASE) 50 MCG/ACT nasal spray Place 2 sprays into both nostrils daily. 9.9 g 2  . valsartan (DIOVAN) 80 MG tablet Take 80 mg by mouth daily.  1   No current facility-administered medications for this visit.     Allergies:   Pork allergy    Social History:  The patient  reports that she has quit smoking. She has never used smokeless tobacco. She reports that she does not drink alcohol or use drugs.   Family History:  The patient's family history includes Coronary artery disease in her father; Diabetes in her mother; Heart attack in her father; Hypertension in her mother and sister; Stroke in her mother.    ROS:  Please see the history of present illness.   Otherwise, review of systems are positive for  anxiety, .   All other systems are reviewed and negative.    PHYSICAL EXAM: VS:  BP (!) 182/100 (BP Location: Left Arm, Patient Position: Sitting, Cuff Size: Normal)   Pulse 100   Ht 5\' 3"  (1.6 m)   Wt 200 lb 6.4 oz (90.9 kg)   SpO2 98%   BMI 35.50 kg/m  , BMI Body mass index is 35.5 kg/m. GEN: Well nourished, well developed, in no acute distress  HEENT: normal  Neck: no JVD, carotid bruits, or masses Cardiac: RRR; no murmurs, rubs, or gallops,no edema  Respiratory:  clear to auscultation bilaterally, normal work of breathing GI: soft, nontender, nondistended, + BS MS: no deformity or atrophy  Skin: warm and dry, no rash Neuro:  Strength and sensation are intact Psych: euthymic mood, full affect  ECG: NSR, no ST segment changes;  episodic tachycardia  Recent Labs: No results found for requested labs within last 8760 hours.   Lipid Panel No results found for: CHOL, TRIG, HDL, CHOLHDL, VLDL, LDLCALC, LDLDIRECT   Other studies Reviewed: Additional studies/ records that were reviewed today with results demonstrating: 2013 echo showed normal LV function.   ASSESSMENT AND PLAN:  1.  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 She can take  atenolol 50 mg daily and take an extra half tablet as needed.   HR controlled at this time.   COntinue to try deep breathing as well and slowing down to help sx.   She tried magnesium afew years ago but felt no difference.  2. Hypertension: Her blood pressure is high today. It has been running in the 545G systolic at home.  Minimizing salt and walking more will help.   Can incrase valsartan if readings are high.  Likely white coat readings.   Lipids managed by Dr. Lysle Rubens.  It will checked again in June. s/p hysterectomy in 2014.  Current medicines are reviewed at length with the patient today.  The patient concerns regarding her medicines were addressed.  The following changes have been made:  No change  Labs/ tests ordered today include:  No orders of the defined types were placed in this encounter.   Recommend 150 minutes/week of aerobic exercise Low fat, low carb, high fiber diet recommended  Disposition:   FU in 1 year   Signed, Larae Grooms, MD  09/04/2016 4:52 PM    [facility] [address], [place], [place]  [address] Phone: [phone]; Fax: [phone]

## 2016-09-17 ENCOUNTER — Ambulatory Visit
Admission: RE | Admit: 2016-09-17 | Discharge: 2016-09-17 | Disposition: A | Discharge status: Home / Self Care | Payer: BLUE CROSS/BLUE SHIELD | Source: Ambulatory Visit | Attending: Internal Medicine | Admitting: Internal Medicine

## 2016-09-17 DIAGNOSIS — Z1231 Encounter for screening mammogram for malignant neoplasm of breast: Secondary | ICD-10-CM

## 2016-12-13 ENCOUNTER — Ambulatory Visit (INDEPENDENT_AMBULATORY_CARE_PROVIDER_SITE_OTHER): Payer: BLUE CROSS/BLUE SHIELD | Admitting: Podiatry

## 2016-12-13 ENCOUNTER — Encounter: Payer: Self-pay | Admitting: Podiatry

## 2016-12-13 ENCOUNTER — Ambulatory Visit (INDEPENDENT_AMBULATORY_CARE_PROVIDER_SITE_OTHER): Payer: BLUE CROSS/BLUE SHIELD

## 2016-12-13 VITALS — BP 123/76 | HR 90 | Resp 16 | Ht 64.0 in | Wt 196.0 lb

## 2016-12-13 DIAGNOSIS — G629 Polyneuropathy, unspecified: Secondary | ICD-10-CM

## 2016-12-13 DIAGNOSIS — M779 Enthesopathy, unspecified: Secondary | ICD-10-CM | POA: Diagnosis not present

## 2016-12-13 MED ORDER — TRIAMCINOLONE ACETONIDE 10 MG/ML IJ SUSP
10.0000 mg | Freq: Once | INTRAMUSCULAR | Status: AC
  Administered 2016-12-13: 10 mg

## 2016-12-13 MED ORDER — DICLOFENAC SODIUM 75 MG PO TBEC: 75.0000 mg | DELAYED_RELEASE_TABLET | Freq: Two times a day (BID) | ORAL | 2 refills | Status: DC

## 2016-12-13 NOTE — Progress Notes (Signed)
   Subjective:    Patient ID: Madison Delgado, female    DOB: 12-20-1960, 56 y.o.   MRN: [id number]  HPI Chief Complaint  Patient presents with  . Foot Pain    Bilateral; plantar forefoot; pt stated, "Entire foot aches; toes 2-4 feel funny when sleeping; used to go to the [facility] but does not take their insurance now; Left foot feels worse than the right foot"; x1 yr      Review of Systems  HENT: Positive for sinus pain.   Eyes: Positive for redness.  Cardiovascular: Positive for palpitations.  Musculoskeletal: Positive for back pain.  All other systems reviewed and are negative.      Objective:   Physical Exam        Assessment & Plan:

## 2016-12-14 NOTE — Progress Notes (Signed)
Subjective:    Patient ID: Madison Delgado, female   DOB: 56 y.o.   MRN: [id number]   HPI patient states on the left foot she's developed a lot of pain in the forefoot and she does not remember specific injury and it's been going on for several months and she had [facility] and they helped her some with her heel but not with this    Review of Systems  All other systems reviewed and are negative.       Objective:  Physical Exam  Cardiovascular: Intact distal pulses.   Musculoskeletal: Normal range of motion.  Neurological: She is alert.  Skin: Skin is warm.  Nursing note and vitals reviewed.  neurovascular status intact muscle strength adequate range of motion within normal limits with patient noted to have exquisite discomfort left foot in the third metatarsophalangeal joint with inflammation fluid around the joint surface. Patient is mild discomfort second but the third is worse at the current time with edema noted around the joint surface but no digital movement. Found have good digital perfusion well oriented 3     Assessment:   Inflammatory capsulitis third MPJ left with possibility for other joint involvements and digital deformity      Plan:     H&P condition reviewed and x-ray reviewed with patient. Today I went ahead and I did a proximal nerve block aspirated the third MPJ getting out of small amount of clear fluid and injected with quarter cc deck Smith some Kenalog and applied padding to disperse weight off the joint surface. Reappoint to recheck again in the next several weeks to see the results  X-ray was negative for signs of fracture or any other kind of bone pathology

## 2017-01-02 ENCOUNTER — Ambulatory Visit: Payer: BLUE CROSS/BLUE SHIELD | Admitting: Podiatry

## 2017-03-13 ENCOUNTER — Encounter: Payer: Self-pay | Admitting: Allergy & Immunology

## 2017-03-13 ENCOUNTER — Ambulatory Visit (INDEPENDENT_AMBULATORY_CARE_PROVIDER_SITE_OTHER): Payer: BLUE CROSS/BLUE SHIELD | Admitting: Allergy & Immunology

## 2017-03-13 VITALS — BP 130/90 | HR 80 | Temp 98.6°F | Resp 16 | Ht 64.76 in | Wt 194.8 lb

## 2017-03-13 DIAGNOSIS — J310 Chronic rhinitis: Secondary | ICD-10-CM

## 2017-03-13 DIAGNOSIS — J320 Chronic maxillary sinusitis: Secondary | ICD-10-CM

## 2017-03-13 MED ORDER — FLUTICASONE PROPIONATE 93 MCG/ACT NA EXHU: 2.0000 | INHALANT_SUSPENSION | Freq: Two times a day (BID) | NASAL | 5 refills | Status: DC | PRN

## 2017-03-13 MED ORDER — MONTELUKAST SODIUM 10 MG PO TABS: 10.0000 mg | ORAL_TABLET | Freq: Every day | ORAL | 5 refills | Status: DC

## 2017-03-13 MED ORDER — OLOPATADINE HCL 0.7 % OP SOLN: 1.0000 [drp] | Freq: Every day | OPHTHALMIC | 5 refills | Status: DC | PRN

## 2017-03-13 NOTE — Progress Notes (Signed)
NEW PATIENT  Date of Service/Encounter:  03/13/17  Referring provider: Wenda Low, MD   Assessment:   Chronic rhinitis   Acute sinusitis  Plan/Recommendations:   1. Chronic rhinitis - We will do skin testing next Monday at 8:30am.  - Take no Claritin from here on out until the next appointment.  - Stop taking: Claritin and Flonase - Start taking: Xhance to sprays per nostril daily, Zyrtec (cetirizine) 10mg  tablet once daily, Singulair (montelukast) 10mg  daily and Pazeo (olopatadine) one drop per eye daily as needed - You can use an extra dose of the antihistamine, if needed, for breakthrough symptoms.  - Consider nasal saline rinses 1-2 times daily to remove allergens from the nasal cavities as well as help with mucous clearance (this is especially helpful to do before the nasal sprays are given) - Consider allergy shots as a means of long-term control. - Allergy shots "re-train" and "reset" the immune system to ignore environmental allergens and decrease the resulting immune response to those allergens (sneezing, itchy watery eyes, runny nose, nasal congestion, etc).    - Allergy shots improve symptoms in 75-85% of patients.  - We can discuss more once we do testing next week.  2. Acute sinusitis - likely allergy mediated - With your current symptoms and time course, antibiotics are not needed.  - Start the prednisone pack provided today.  - If symptoms are not improving in 3-4 days, feel free to call us or email me, at [email or web address] and we can send in an antibiotic at that time.  - Add on nasal saline spray (i.e., Simply Saline) or nasal saline lavage (i.e., NeilMed) as needed prior to medicated nasal sprays. - For thick post nasal drainage, add guaifenesin [id number] mg (Mucinex)  twice daily as needed with adequate hydration.  3. Return in about 5 days (around 03/18/2017).  Subjective:   AIRIS BARBEE is a 56 y.o. female presenting today for  evaluation of  Chief Complaint  Patient presents with  . Nasal Congestion  . Headache    pressure on left side behind the eyes    [name] has a history of the following: Patient Active Problem List   Diagnosis Date Noted  . Postmenopausal bleeding 04/22/2013  . Fibroids 04/22/2013  . S/P hysterectomy 04/22/2013  . Palpitations   . Hypertension   . Hyperlipidemia   . Obesity     History obtained from: chart review and patient, who unfortunately took Claritin yesterday.   [name] was referred by Wenda Low, MD.     Gizzelle is a 56 y.o. delightful female presenting to establish care. She moved here from [place] in 2003. Prior to moving here, she was actually doing fairly well. She did not have any symptoms in [place], but it has worsened over the course of the last year. She reports that she has been on Flonase and Claritin fairly consistently. She has tried Human resources officer, but developed palpitations. She has no tried Zyrtec. The nose spray has not seemed to be helping. She went to see ENT Dr. Redmond Baseman in September 2018. Dr. Redmond Baseman recommended an allergy evaluation first before proceeding to surgery. She has chronic congestion in her left side of her nose. Se also endorses right sided sinus pressure. She has never tried any other nasal sprays. Symptoms have been occurring throughout the year at this point. She does report some ocular itching as well. She has never tried any eye drops.   She has no history of  asthma. She does have palpitations and take atenolol 50mg  daily. She did have an isolated episode of hives years ago. She denies eczema. She has no history of adverse reactions to drugs, foods, or stinging insects. There is no infectious history aside from the recurrent sinusitis. Vaccinations are up to date. She estimates that she is treated with antibiotics for sinus infections around 2-3 times per year.       Past Medical History: Patient Active Problem List    Diagnosis Date Noted  . Postmenopausal bleeding 04/22/2013  . Fibroids 04/22/2013  . S/P hysterectomy 04/22/2013  . Palpitations   . Hypertension   . Hyperlipidemia   . Obesity     Medication List:  Allergies as of 03/13/2017      Reactions   Pork Allergy Swelling   Certain types of pork products cause lips to swell and SOB      Medication List       Accurate as of 03/13/17  9:24 AM. Always use your most recent med list.          ALPRAZolam 0.5 MG tablet Commonly known as:  XANAX Take 0.5 mg by mouth 2 (two) times daily as needed for anxiety or sleep.   atenolol 50 MG tablet Commonly known as:  TENORMIN Take 50 mg by mouth daily. May take additional 25 mg (1/2 tablet) daily PRN for palpitations   diclofenac 75 MG EC tablet Commonly known as:  VOLTAREN Take 1 tablet (75 mg total) by mouth 2 (two) times daily.   fluticasone 50 MCG/ACT nasal spray Commonly known as:  FLONASE Place 2 sprays into both nostrils daily.   loratadine 10 MG tablet Commonly known as:  CLARITIN Take 10 mg by mouth daily.   olmesartan 20 MG tablet Commonly known as:  BENICAR Take 20 mg by mouth daily.       Birth History: non-contributory.   Developmental History: non-contributory.   Past Surgical History: Past Surgical History:  Procedure Laterality Date  . ADENOIDECTOMY    . BILATERAL SALPINGECTOMY Bilateral 04/22/2013   Procedure: BILATERAL SALPINGECTOMY;  Surgeon: Elveria Royals, MD;  Location: [facility];  Service: Gynecology;  Laterality: Bilateral;  . CARPAL TUNNEL RELEASE    . ROBOTIC ASSISTED TOTAL HYSTERECTOMY N/A 04/22/2013   Procedure: ROBOTIC ASSISTED TOTAL HYSTERECTOMY;  Surgeon: Elveria Royals, MD;  Location: [facility];  Service: Gynecology;  Laterality: N/A;  3 1/2 hrs.  . TONSILLECTOMY    . WISDOM TOOTH EXTRACTION       Family History: Family History  Problem Relation Age of Onset  . Diabetes Mother   . Hypertension Mother   . Stroke Mother   . Coronary  artery disease Father   . Heart attack Father   . Hypertension Sister   . Asthma Sister   . Breast cancer Neg Hx   . Allergic rhinitis Neg Hx   . Angioedema Neg Hx   . Atopy Neg Hx   . Eczema Neg Hx   . Immunodeficiency Neg Hx   . Urticaria Neg Hx      Social History: Jennice lives at home with her family.  She lives in a house that is 55 years old.  There are hardwood throughout the home.  She has a fascinating.  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 She is also getting her Masters in  Early Childhood Education.     Review of Systems: a 14-point review of systems is pertinent for what is mentioned in HPI.  Otherwise, all other systems were negative. Constitutional: negative other than that listed in the HPI Eyes: negative other than that listed in the HPI Ears, nose, mouth, throat, and face: negative other than that listed in the HPI Respiratory: negative other than that listed in the HPI Cardiovascular: negative other than that listed in the HPI Gastrointestinal: negative other than that listed in the HPI Genitourinary: negative other than that listed in the HPI Integument: negative other than that listed in the HPI Hematologic: negative other than that listed in the HPI Musculoskeletal: negative other than that listed in the HPI Neurological: negative other than that listed in the HPI Allergy/Immunologic: negative other than that listed in the HPI    Objective:   Blood pressure 130/90, pulse 80, temperature 98.6 F (37 C), temperature source Oral, resp. rate 16, height 5' 4.76" (1.645 m), weight 194 lb 12.8 oz (88.4 kg), SpO2 98 %. Body mass index is 32.65 kg/m.   Physical Exam:  General: Alert, interactive, in no acute distress. Very pleasant female.  Eyes: No conjunctival injection bilaterally, no discharge on the right,  no discharge on the left, no Horner-Trantas dots present and allergic shiners present bilaterally. PERRL bilaterally. EOMI without pain. No photophobia.  Ears: Right TM pearly gray with normal light reflex, Left TM pearly gray with normal light reflex, Right TM intact without perforation and Left TM intact without perforation.  Nose/Throat: External nose within normal limits and septum midline. Turbinates markedly edematous and pale with clear discharge. Posterior oropharynx erythematous with cobblestoning in the posterior oropharynx. Tonsils 2+ without exudates.  Tongue without thrush. Bilateral maxillary sinus tenderness (L>>R).  Neck: Supple without thyromegaly. Trachea midline. Adenopathy: shoddy bilateral anterior cervical lymphadenopathy and no enlarged lymph nodes appreciated in the occipital, axillary, epitrochlear, inguinal, or popliteal regions. Lungs: Clear to auscultation without wheezing, rhonchi or rales. No increased work of breathing. CV: Normal S1/S2. No murmurs. Capillary refill <2 seconds.  Abdomen: Nondistended, nontender. No guarding or rebound tenderness. Bowel sounds present in all fields and hypoactive  Skin: Warm and dry, without lesions or rashes. Extremities:  No clubbing, cyanosis or edema. Neuro:   Grossly intact. No focal deficits appreciated. Responsive to questions.  Diagnostic studies: deferred due to recent antihistamine use      Salvatore Marvel, MD [facility]

## 2017-03-13 NOTE — Patient Instructions (Addendum)
1. Chronic rhinitis - We will do skin testing next Monday at 8:30am.  - Take no Claritin from here on out until the next appointment.  - Stop taking: Claritin and Flonase - Start taking: Xhance to sprays per nostril daily, Zyrtec (cetirizine) 10mg  tablet once daily, Singulair (montelukast) 10mg  daily and Pazeo (olopatadine) one drop per eye daily as needed - You can use an extra dose of the antihistamine, if needed, for breakthrough symptoms.  - Consider nasal saline rinses 1-2 times daily to remove allergens from the nasal cavities as well as help with mucous clearance (this is especially helpful to do before the nasal sprays are given) - Consider allergy shots as a means of long-term control. - Allergy shots "re-train" and "reset" the immune system to ignore environmental allergens and decrease the resulting immune response to those allergens (sneezing, itchy watery eyes, runny nose, nasal congestion, etc).    - Allergy shots improve symptoms in 75-85% of patients.  - We can discuss more once we do testing next week.  2. Acute sinusitis - likely allergy mediated - With your current symptoms and time course, antibiotics are not needed.  - Start the prednisone pack provided today.  - If symptoms are not improving in 3-4 days, feel free to call us or email me, at [email or web address] and we can send in an antibiotic at that time.  - Add on nasal saline spray (i.e., Simply Saline) or nasal saline lavage (i.e., NeilMed) as needed prior to medicated nasal sprays. - For thick post nasal drainage, add guaifenesin [id number] mg (Mucinex)  twice daily as needed with adequate hydration.  3. Return in about 5 days (around 03/18/2017).   Please inform us of any Emergency Department visits, hospitalizations, or changes in symptoms. Call us before going to the ED for breathing or allergy symptoms since we might be able to fit you in for a sick visit. Feel free to contact us anytime with any questions,  problems, or concerns.  It was a pleasure to meet you today! Enjoy the Halloween season!  Websites that have reliable patient information: 1. American Academy of Asthma, Allergy, and Immunology: www.aaaai.org 2. Food Allergy Research and Education (FARE): foodallergy.org 3. Mothers of Asthmatics: http://www.asthmacommunitynetwork.org 4. American College of Allergy, Asthma, and Immunology: www.acaai.org   Election Day is coming up on Tuesday, November 6th! Although it is too late to register to vote by mail, you can still register up to November 5th at any of the early voting locations. Try to early vote in case there are problems with your registration!   If you are turned away at the polls, you have the right to request a provisional ballot, which is required by law!      [place] (open 8am - 5pm) First [address] [place], [place] (open [place]) [place], [place] (open 7am - 7pm)  [place], [employer] (open 7am - 7pm) [address], [employer] (open 7am - 7pm) [address], [employer] (open 7am - 7pm) [place], [place] (open [place]) [place], [place] (open 7am - 7pm) [place], [employer] (open 7am - 7pm) [place], [place]

## 2017-03-18 ENCOUNTER — Encounter: Payer: Self-pay | Admitting: Allergy & Immunology

## 2017-03-18 ENCOUNTER — Ambulatory Visit (INDEPENDENT_AMBULATORY_CARE_PROVIDER_SITE_OTHER): Payer: BLUE CROSS/BLUE SHIELD | Admitting: Allergy & Immunology

## 2017-03-18 VITALS — BP 130/80 | HR 70 | Temp 98.0°F | Resp 16

## 2017-03-18 DIAGNOSIS — J302 Other seasonal allergic rhinitis: Secondary | ICD-10-CM

## 2017-03-18 DIAGNOSIS — J3089 Other allergic rhinitis: Secondary | ICD-10-CM | POA: Insufficient documentation

## 2017-03-18 HISTORY — DX: Other seasonal allergic rhinitis: J30.89

## 2017-03-18 HISTORY — DX: Other seasonal allergic rhinitis: J30.2

## 2017-03-18 MED ORDER — EPINEPHRINE 0.3 MG/0.3ML IJ SOAJ: 0.3000 mg | Freq: Once | INTRAMUSCULAR | 3 refills | Status: AC

## 2017-03-18 NOTE — Progress Notes (Signed)
FOLLOW UP  Date of Service/Encounter:  03/18/17   Assessment:   Seasonal and perennial allergic rhinitis (trees, weeds, grasses, indoor molds, outdoor molds and cat)  Plan/Recommendations:   1. Seasonal and perennial allergic rhinitis - Testing today showed: trees, weeds, grasses, indoor molds, outdoor molds and cat  - Avoidance measures provided. - Continue taking: Xhance to sprays per nostril daily, Zyrtec (cetirizine) 10mg  tablet once daily, Singulair (montelukast) 10mg  daily and Pazeo (olopatadine) one drop per eye daily as needed - You can use an extra dose of the antihistamine, if needed, for breakthrough symptoms.  - Consider nasal saline rinses 1-2 times daily to remove allergens from the nasal cavities as well as help with mucous clearance (this is especially helpful to do before the nasal sprays are given) - Consider allergy shots as a means of long-term control. - Allergy shots "re-train" and "reset" the immune system to ignore environmental allergens and decrease the resulting immune response to those allergens (sneezing, itchy watery eyes, runny nose, nasal congestion, etc).    - Allergy shots improve symptoms in 75-85% of patients.  - Call your insurance company to check on any copayments required.  2. 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 - They should call you in 1-2 days to confirm your shipping address. - We did not do testing for pork today since it would not change her management. - She refuses to consider eating it again.   3. Return in about 3 months (around 06/18/2017).   Subjective:   Madison Delgado is a 56 y.o. female presenting today for follow up of  Chief Complaint  Patient presents with  . Allergy Testing    Madison Delgado has a history of the following: Patient Active Problem List   Diagnosis Date Noted  . Postmenopausal bleeding 04/22/2013  . Fibroids 04/22/2013  . S/P hysterectomy  04/22/2013  . Palpitations   . Hypertension   . Hyperlipidemia   . Obesity     History obtained from: chart review and patient.  Madison Delgado Primary Care Provider is Wenda Low, MD.     Madison Delgado is a 56 y.o. female presenting for a skin testing.  She was last seen on October 31.  At that time, she had recently taken an antihistamine so we cannot do testing.  She was endorsing symptoms of chronic nasal congestion worse in the left side of her nose.  She was also endorsing right-sided chronic nasal pressure with ocular itching.  We started her on Xhance 1 puff per nostril daily, cetirizine 10 mg daily, Singulair 10 mg daily, and Pazeo 1 drop per eye daily.  Samples were provided.  Since the last visit, she has done well.  She has remained off of her antihistamines as recommended.  She did start the nasal spray, and does feel some relief from that.  She also reports a pork allergy today.  With pork exposure, she develops tongue swelling as well as throat itching.  She has never needed to go to the ER for these symptoms and has treated them with antihistamines only.  She does not have an epinephrine injector.  She is not interested in testing since she does not plan to eat it at all.  She does tolerate beef and lamb without any problems.  Otherwise, there have been no changes to her past medical history, surgical history, family history, or social history.    Review of Systems: a 14-point  review of systems is pertinent for what is mentioned in HPI.  Otherwise, all other systems were negative. Constitutional: negative other than that listed in the HPI Eyes: negative other than that listed in the HPI Ears, nose, mouth, throat, and face: negative other than that listed in the HPI Respiratory: negative other than that listed in the HPI Cardiovascular: negative other than that listed in the HPI Gastrointestinal: negative other than that listed in the HPI Genitourinary: negative other than  that listed in the HPI Integument: negative other than that listed in the HPI Hematologic: negative other than that listed in the HPI Musculoskeletal: negative other than that listed in the HPI Neurological: negative other than that listed in the HPI Allergy/Immunologic: negative other than that listed in the HPI    Objective:   Blood pressure 130/80, pulse 70, temperature 98 F (36.7 C), temperature source Oral, resp. rate 16, SpO2 98 %. There is no height or weight on file to calculate BMI.   Physical Exam: deferred since this was a skin testing appointment only   Diagnostic studies:    Allergy Studies:   Indoor/Outdoor Percutaneous Adult Environmental Panel: positive to [place] blue grass, burweed marsh elder, short ragweed, sheep sorrel, rough pigweed, common mugwort, ash, American beech, Box elder, Alternaria, Cladosporium, Aspergillus, Penicillium, Drechslera, Fusarium, epicoccum, Phoma and cat. Otherwise negative with adequate controls.    Salvatore Marvel, MD [facility]

## 2017-03-18 NOTE — Patient Instructions (Addendum)
1. Seasonal and perennial allergic rhinitis - Testing today showed: trees, weeds, grasses, indoor molds, outdoor molds and cat  - Positive results today: [place] blue grass, burweed marsh elder, short ragweed, sheep sorrel, rough pigweed, common mugwort, ash, American beech, Box elder, Alternaria, Cladosporium, Aspergillus, Penicillium, Drechslera, Fusarium, epicoccum, Phoma and cat - Avoidance measures provided. - Continue taking: Xhance to sprays per nostril daily, Zyrtec (cetirizine) 10mg  tablet once daily, Singulair (montelukast) 10mg  daily and Pazeo (olopatadine) one drop per eye daily as needed - You can use an extra dose of the antihistamine, if needed, for breakthrough symptoms.  - Consider nasal saline rinses 1-2 times daily to remove allergens from the nasal cavities as well as help with mucous clearance (this is especially helpful to do before the nasal sprays are given) - Consider allergy shots as a means of long-term control. - Allergy shots "re-train" and "reset" the immune system to ignore environmental allergens and decrease the resulting immune response to those allergens (sneezing, itchy watery eyes, runny nose, nasal congestion, etc).    - Allergy shots improve symptoms in 75-85% of patients.  - Call your insurance company to check on any copayments required.  2. 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 - They should call you in 1-2 days to confirm your shipping address.  3. Return in about 3 months (around 06/18/2017).    Please inform us of any Emergency Department visits, hospitalizations, or changes in symptoms. Call us before going to the ED for breathing or allergy symptoms since we might be able to fit you in for a sick visit. Feel free to contact us anytime with any questions, problems, or concerns.  It was a pleasure to see you again today! Enjoy the Thanksgiving season !  Websites that have reliable patient  information: 1. American Academy of Asthma, Allergy, and Immunology: www.aaaai.org 2. Food Allergy Research and Education (FARE): foodallergy.org 3. Mothers of Asthmatics: http://www.asthmacommunitynetwork.org 4. American College of Allergy, Asthma, and Immunology: www.acaai.org   Election Day is coming up on Tuesday, November 6th! Make your voice heard! Polls are open from 6:30am until 7:30pm!   If you are turned away at the polls, you have the right to request a provisional ballot, which is required by law!     Reducing Pollen Exposure  The American Academy of Allergy, Asthma and Immunology suggests the following steps to reduce your exposure to pollen during allergy seasons.    1. Do not hang sheets or clothing out to dry; pollen may collect on these items. 2. Do not mow lawns or spend time around freshly cut grass; mowing stirs up pollen. 3. Keep windows closed at night.  Keep car windows closed while driving. 4. Minimize morning activities outdoors, a time when pollen counts are usually at their highest. 5. Stay indoors as much as possible when pollen counts or humidity is high and on windy days when pollen tends to remain in the air longer. 6. Use air conditioning when possible.  Many air conditioners have filters that trap the pollen spores. 7. Use a HEPA room air filter to remove pollen form the indoor air you breathe.  Control of Mold Allergen   Mold and fungi can grow on a variety of surfaces provided certain temperature and moisture conditions exist.  Outdoor molds grow on plants, decaying vegetation and soil.  The major outdoor mold, Alternaria and Cladosporium, are found in very high numbers during hot and dry conditions.  Generally,  a late Summer - Fall peak is seen for common outdoor fungal spores.  Rain will temporarily lower outdoor mold spore count, but counts rise rapidly when the rainy period ends.  The most important indoor molds are Aspergillus and Penicillium.  Dark,  humid and poorly ventilated basements are ideal sites for mold growth.  The next most common sites of mold growth are the bathroom and the kitchen.  Outdoor (Seasonal) Mold Control  Positive outdoor molds via skin testing: Alternaria, Cladosporium, Drechslera (Curvalaria) and Epicoccum  1. Use air conditioning and keep windows closed 2. Avoid exposure to decaying vegetation. 3. Avoid leaf raking. 4. Avoid grain handling. 5. Consider wearing a face mask if working in moldy areas.  6.   Indoor (Perennial) Mold Control   Positive indoor molds via skin testing: Aspergillus, Penicillium, Fusarium and Phoma  1. Maintain humidity below 50%. 2. Clean washable surfaces with 5% bleach solution. 3. Remove sources e.g. contaminated carpets.     Control of Dog or Cat Allergen  Avoidance is the best way to manage a dog or cat allergy. If you have a dog or cat and are allergic to dog or cats, consider removing the dog or cat from the home. If you have a dog or cat but don't want to find it a new home, or if your family wants a pet even though someone in the household is allergic, here are some strategies that may help keep symptoms at bay:  1. Keep the pet out of your bedroom and restrict it to only a few rooms. Be advised that keeping the dog or cat in only one room will not limit the allergens to that room. 2. Don't pet, hug or kiss the dog or cat; if you do, wash your hands with soap and water. 3. High-efficiency particulate air (HEPA) cleaners run continuously in a bedroom or living room can reduce allergen levels over time. 4. Regular use of a high-efficiency vacuum cleaner or a central vacuum can reduce allergen levels. 5. Giving your dog or cat a bath at least once a week can reduce airborne allergen.

## 2017-03-26 ENCOUNTER — Telehealth: Payer: Self-pay

## 2017-03-26 NOTE — Telephone Encounter (Signed)
Lm for pt to call us back about contacting aspn about shipment of auvi-q

## 2017-06-19 ENCOUNTER — Ambulatory Visit: Payer: BLUE CROSS/BLUE SHIELD | Admitting: Allergy & Immunology

## 2017-09-02 ENCOUNTER — Ambulatory Visit (INDEPENDENT_AMBULATORY_CARE_PROVIDER_SITE_OTHER): Payer: Managed Care, Other (non HMO)

## 2017-09-02 ENCOUNTER — Ambulatory Visit: Payer: Managed Care, Other (non HMO) | Admitting: Podiatry

## 2017-09-02 ENCOUNTER — Encounter: Payer: Self-pay | Admitting: Podiatry

## 2017-09-02 DIAGNOSIS — M778 Other enthesopathies, not elsewhere classified: Secondary | ICD-10-CM

## 2017-09-02 DIAGNOSIS — M7752 Other enthesopathy of left foot: Secondary | ICD-10-CM

## 2017-09-02 DIAGNOSIS — M779 Enthesopathy, unspecified: Secondary | ICD-10-CM

## 2017-09-02 DIAGNOSIS — M7751 Other enthesopathy of right foot: Secondary | ICD-10-CM | POA: Diagnosis not present

## 2017-09-02 MED ORDER — TRIAMCINOLONE ACETONIDE 10 MG/ML IJ SUSP
10.0000 mg | Freq: Once | INTRAMUSCULAR | Status: AC
  Administered 2017-09-02: 10 mg

## 2017-09-02 MED ORDER — DICLOFENAC SODIUM 75 MG PO TBEC: 75.0000 mg | DELAYED_RELEASE_TABLET | Freq: Two times a day (BID) | ORAL | 2 refills | Status: DC

## 2017-09-03 NOTE — Progress Notes (Signed)
Subjective:   Patient ID: Madison Delgado, female   DOB: 57 y.o.   MRN: [id number]   HPI Patient presents stating she developed a lot of pain in the third metatarsal joint of both feet with inflammation fluid buildup bilateral and states she did very well for about 6 months after the treatment on the left   ROS      Objective:  Physical Exam  Neurovascular status intact with inflammation pain of the third MPJ bilateral with fluid buildup around the joint surface     Assessment:  Inflammatory capsulitis third MPJ bilateral     Plan:  Proximal nerve blocks administered explained risk of procedure and using sterile technique I went ahead and aspirated the third MPJ bilateral getting out a small amount of clear fluid and injected quarter cc dexamethasone Kenalog into the joint and advised on rigid bottom shoes placed on oral anti-inflammatory and will see him back if symptomatic and ultimately may require shortening type osteotomy  X-ray right did not indicate signs of fracture and did not indicate arthritis

## 2017-09-23 ENCOUNTER — Ambulatory Visit: Payer: Managed Care, Other (non HMO) | Admitting: Podiatry

## 2017-09-26 ENCOUNTER — Ambulatory Visit: Payer: Managed Care, Other (non HMO) | Admitting: Podiatry

## 2017-09-26 ENCOUNTER — Encounter: Payer: Self-pay | Admitting: Podiatry

## 2017-09-26 DIAGNOSIS — L600 Ingrowing nail: Secondary | ICD-10-CM | POA: Diagnosis not present

## 2017-09-26 DIAGNOSIS — M779 Enthesopathy, unspecified: Secondary | ICD-10-CM

## 2017-09-26 NOTE — Patient Instructions (Signed)

## 2017-09-27 ENCOUNTER — Other Ambulatory Visit: Payer: Self-pay | Admitting: Internal Medicine

## 2017-09-27 DIAGNOSIS — Z1231 Encounter for screening mammogram for malignant neoplasm of breast: Secondary | ICD-10-CM

## 2017-09-29 NOTE — Progress Notes (Signed)
Subjective:   Patient ID: Madison Delgado, female   DOB: 57 y.o.   MRN: [id number]   HPI Patient states the joint seems quite a bit improved but there is a definite nail that is been sore on her right big toe over her left makes it hard for her to wear shoe gear comfortably   ROS      Objective:  Physical Exam  Neurovascular status intact negative Homans sign was noted with patient's right hallux lateral border found to be incurvated and sore when palpated with significant reduction of inflammation in the capsule bilateral     Assessment:  Doing well post capsulitis with incurvated nail border right hallux lateral border that is painful when palpated     Plan:  H&P condition reviewed and discussed continued treatment with capsulitis with rigid rigid bottom shoes I recommended treating this ingrown toenail which patient wants done.  I explained procedure and risk and after reading consent Delgado patient signed consent Delgado understanding risk and today I infiltrated the right hallux 60 mg like Marcaine mixture remove the border exposed matrix all under sterile condition and using sterile instrumentation and applied phenol 3 applications 30 seconds followed by alcohol lavage sterile dressing.  Given instructions on soaks reappoint

## 2017-10-21 ENCOUNTER — Ambulatory Visit: Payer: BLUE CROSS/BLUE SHIELD

## 2017-12-02 ENCOUNTER — Ambulatory Visit: Payer: Self-pay

## 2017-12-03 ENCOUNTER — Ambulatory Visit
Admission: RE | Admit: 2017-12-03 | Discharge: 2017-12-03 | Disposition: A | Discharge status: Home / Self Care | Payer: Managed Care, Other (non HMO) | Source: Ambulatory Visit | Attending: Internal Medicine | Admitting: Internal Medicine

## 2017-12-03 DIAGNOSIS — Z1231 Encounter for screening mammogram for malignant neoplasm of breast: Secondary | ICD-10-CM

## 2017-12-24 DIAGNOSIS — R000 Tachycardia, unspecified: Secondary | ICD-10-CM | POA: Insufficient documentation

## 2017-12-24 NOTE — Progress Notes (Signed)
Cardiology Office Note    Date:  12/25/2017   ID:  Madison Delgado, DOB 09-19-1960, MRN [id number]  PCP:  Wenda Low, MD  Cardiologist: Larae Grooms, MD  Chief Complaint  Patient presents with  . Follow-up    History of Present Illness:  Madison Delgado is a 56 y.o. female with history of inappropriate sinus tachycardia diagnosed at [facility] treated with beta-blocker, normal LV function on 2D echo 2013, hypertension, HLD, obesity. Normal GXT 2017.  Last saw Dr. Irish Lack 08/2016 at which time she complained of palpitations and heart racing if she gets stressed.  She feels like she is going to die from the palpitations although she has been told by multiple physicians that she will not even after all these years.  He told her to take atenolol 50 mg daily and a half extra half a tablet as needed.  Magnesium has not helped in the past.  Blood pressure was up that day he asked her to minimize salt and increase exercise.  Could increase valsartan if needed.  Patient comes in for yearly f/u. She still is having palpitations mostly controlled with atenolol. Occasionally has left upper abdominal pain after she eats or when she sneezes.implants goes to her right upper quadrant as well.  Comes and goes for hours. Doesn't limit her activities.  PCP told her to try Gas-[employer].  She walks and bikes. Just joined weight watchers trying to lose weight. 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   Past Medical History:  Diagnosis Date  . Fibroids 04/22/2013  . History of stress test    a. ETT 5/17: normal  . Hyperlipidemia   . Hypertension   . Obesity   . Palpitations   . Postmenopausal bleeding 04/22/2013  . S/P hysterectomy 04/22/2013  . Seasonal and perennial allergic rhinitis 03/18/2017    Past Surgical History:  Procedure Laterality Date  . ADENOIDECTOMY    . BILATERAL SALPINGECTOMY Bilateral 04/22/2013   Procedure: BILATERAL SALPINGECTOMY;  Surgeon: Elveria Royals, MD;  Location: [facility];  Service: Gynecology;  Laterality: Bilateral;  . CARPAL TUNNEL RELEASE    . ROBOTIC ASSISTED TOTAL HYSTERECTOMY N/A 04/22/2013   Procedure: ROBOTIC ASSISTED TOTAL HYSTERECTOMY;  Surgeon: Elveria Royals, MD;  Location: [facility];  Service: Gynecology;  Laterality: N/A;  3 1/2 hrs.  . TONSILLECTOMY    . WISDOM TOOTH EXTRACTION      Current Medications: Current Meds  Medication Sig  . ALPRAZolam (XANAX) 0.5 MG tablet Take 0.5 mg by mouth 2 (two) times daily as needed for anxiety or sleep.   Marland Kitchen atenolol (TENORMIN) 50 MG tablet Take 50 mg by mouth daily. May take additional 25 mg (1/2 tablet) daily PRN for palpitations  . cetirizine (ZYRTEC) 10 MG tablet Take 10 mg by mouth daily.  . diclofenac (VOLTAREN) 75 MG EC tablet Take 1 tablet (75 mg total) by mouth 2 (two) times daily.  . diclofenac (VOLTAREN) 75 MG EC tablet Take 1 tablet (75 mg total) by mouth 2 (two) times daily.  . fluticasone (FLONASE) 50 MCG/ACT nasal spray Place 2 sprays into both nostrils daily.  . Fluticasone Propionate (XHANCE) 93 MCG/ACT EXHU Place 2 sprays into both nostrils 2 (two) times daily as needed.  . loratadine (CLARITIN) 10 MG tablet Take 10 mg by mouth daily.  Marland Kitchen olmesartan (BENICAR) 20 MG tablet Take 40 mg by mouth daily.   . Olopatadine HCl (PAZEO) 0.7 % SOLN Place 1 drop into both eyes daily  as needed.     Allergies:   Pork allergy   Social History   Socioeconomic History  . Marital status: Divorced    Spouse name: Not on file  . Number of children: Not on file  . Years of education: Not on file  . Highest education level: Not on file  Occupational History  . Not on file  Social Needs  . Financial resource strain: Not on file  . Food insecurity:    Worry: Not on file    Inability: Not on file  . Transportation needs:    Medical: Not on file    Non-medical: Not on file  Tobacco Use  . Smoking status: Former Research scientist (life sciences)  . Smokeless tobacco: Never Used  Substance and  Sexual Activity  . Alcohol use: No  . Drug use: No  . Sexual activity: Yes    Birth control/protection: None  Lifestyle  . Physical activity:    Days per week: Not on file    Minutes per session: Not on file  . Stress: Not on file  Relationships  . Social connections:    Talks on phone: Not on file    Gets together: Not on file    Attends religious service: Not on file    Active member of club or organization: Not on file    Attends meetings of clubs or organizations: Not on file    Relationship status: Not on file  Other Topics Concern  . 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     Family History:  The patient's family history includes Asthma in her sister; Coronary artery disease in her father; Diabetes in her mother; Heart attack in her father; Hypertension in her mother and sister; Stroke in her mother.   ROS:   Please see the history of present illness.    Review of Systems  Constitution: Negative.  HENT: Negative.   Eyes: Negative.   Cardiovascular: Positive for palpitations.  Respiratory: Negative.   Hematologic/Lymphatic: Negative.   Musculoskeletal: Negative.  Negative for joint pain.  Gastrointestinal: Positive for abdominal pain.  Genitourinary: Negative.   Neurological: Negative.    All other systems reviewed and are negative.   PHYSICAL EXAM:   VS:  BP 118/80   Pulse 81   Ht 5' 0.48" (1.536 m)   Wt 204 lb 3.2 oz (92.6 kg)   SpO2 98%   BMI 39.25 kg/m   Physical Exam  GEN: Well nourished, well developed, in no acute distress  Neck: no JVD, carotid bruits, or masses Cardiac:RRR; no murmurs, rubs, or gallops  Respiratory:  clear to auscultation bilaterally, normal work of breathing GI: soft, nontender, nondistended, + BS Ext: without cyanosis, clubbing, or edema, Good distal pulses bilaterally Neuro:  Alert and Oriented x 3 Psych: euthymic mood, full  affect  Wt Readings from Last 3 Encounters:  12/25/17 204 lb 3.2 oz (92.6 kg)  03/13/17 194 lb 12.8 oz (88.4 kg)  12/13/16 196 lb (88.9 kg)      Studies/Labs Reviewed:   EKG:  EKG is  ordered today.  The ekg ordered today demonstrates normal sinus rhythm normal EKG  Recent Labs: No results found for requested labs within last 8760 hours.   Lipid Panel No results found for: CHOL, TRIG, HDL, CHOLHDL, VLDL, LDLCALC, LDLDIRECT  Additional studies/ records that were reviewed today include:  2D echo  2013 normal LV function no valvular abnormalities  GXT 2017 Study Highlights    There was no ST segment deviation noted during stress.  No electrocardiographic evidence of ischemia, good exercise tolerance 9 minutes, normal blood pressure response. Low risk test   Madison Furbish, MD     ASSESSMENT:    1. Essential hypertension   2. Inappropriate sinus tachycardia   3. Mixed hyperlipidemia      PLAN:  In order of problems listed above:  Inappropriate sinus tachycardia on beta-blockers precipitated by stress-doing well on atenolol and rarely has to take an extra half a tablet for palpitations.  Recent labs by PCP all look good including TSH and renal function.  Follow-up with Dr. Irish Lack in 1 year.  Essential hypertension blood pressure well controlled on atenolol and Benicar  Hyperlipidemia LDL 75 cholesterol 155 on labs 12/05/2017 not on medication hemoglobin A1c was 6.2 so she is watching her sugars.    Medication Adjustments/Labs and Tests Ordered: Current medicines are reviewed at length with the patient today.  Concerns regarding medicines are outlined above.  Medication changes, Labs and Tests ordered today are listed in the Patient Instructions below. Patient Instructions  Medication Instructions: Your physician recommends that you continue on your current medications as directed. Please refer to the Current Medication list given to you today.   Labwork: None  Ordered  Procedures/Testing: None Ordered  Follow-Up: Your physician wants you to follow-up in: 1 year with Dr.Varanasi You will receive a reminder letter in the mail two months in advance. If you don't receive a letter, please call our office to schedule the follow-up appointment.   Any Additional Special Instructions Will Be Listed Below (If Applicable).     If you need a refill on your cardiac medications before your next appointment, please call your pharmacy.      Sumner Boast, PA-C  12/25/2017 8:33 AM    [facility] [address], [place], [place]  [address] Phone: [phone]; Fax: [phone]

## 2017-12-25 ENCOUNTER — Encounter

## 2017-12-25 ENCOUNTER — Ambulatory Visit: Payer: Managed Care, Other (non HMO) | Admitting: Physician Assistant

## 2017-12-25 ENCOUNTER — Encounter: Payer: Self-pay | Admitting: Physician Assistant

## 2017-12-25 VITALS — BP 118/80 | HR 81 | Ht 60.48 in | Wt 204.2 lb

## 2017-12-25 DIAGNOSIS — R000 Tachycardia, unspecified: Secondary | ICD-10-CM

## 2017-12-25 DIAGNOSIS — E782 Mixed hyperlipidemia: Secondary | ICD-10-CM

## 2017-12-25 DIAGNOSIS — I10 Essential (primary) hypertension: Secondary | ICD-10-CM | POA: Diagnosis not present

## 2017-12-25 NOTE — Patient Instructions (Signed)
Medication Instructions: Your physician recommends that you continue on your current medications as directed. Please refer to the Current Medication list given to you today.   Labwork: None Ordered  Procedures/Testing: None Ordered  Follow-Up: Your physician wants you to follow-up in: 1 year with Dr.Varanasi You will receive a reminder letter in the mail two months in advance. If you don't receive a letter, please call our office to schedule the follow-up appointment.   Any Additional Special Instructions Will Be Listed Below (If Applicable).     If you need a refill on your cardiac medications before your next appointment, please call your pharmacy.

## 2018-03-06 ENCOUNTER — Telehealth: Payer: Self-pay | Admitting: Interventional Cardiology

## 2018-03-06 MED ORDER — ATENOLOL 50 MG PO TABS: 50.0000 mg | ORAL_TABLET | Freq: Every day | ORAL | 3 refills | Status: DC

## 2018-03-06 NOTE — Telephone Encounter (Signed)
Returned call to patient. She states that she has been stressed lately and had a cold for the past 2 weeks. She states that she has had intermittent episodes of palpitations accompanied with anxiety and nervousness. She denies chest pain, irregular beats, SOB, or any other Sx. Patient takes atenolol 50 mg QD. Patient states that her HR has been in the 80s. Instructed patient to take an extra 1/2 of her atenolol prn for her palpitations along with her prn Xanax. Instructed the patient to avoid caffeine. Patient verbalized understanding and thanked me for the call.

## 2018-03-06 NOTE — Telephone Encounter (Signed)
New message    Patient c/o Palpitations:  High priority if patient c/o lightheadedness, shortness of breath, or chest pain  1) How long have you had palpitations/irregular HR/ Afib? Are you having the symptoms now? Every day for 2 weeks, yes   2) Are you currently experiencing lightheadedness, SOB or CP? No   3) Do you have a history of afib (atrial fibrillation) or irregular heart rhythm? Yes   4) Have you checked your BP or HR? (document readings if available):80 -82 hr  5) Are you experiencing any other symptoms? No

## 2018-03-13 NOTE — Telephone Encounter (Signed)
Patient calling again and states that she is still having intermittent episodes of palpitations accompanied with anxiety and nervousness. She denies chest pain, irregular beats, SOB, or any other Sx. She usually takes atenolol 50 mg QD. She states that she has been taking 75 mg QD along with her Xanax.She states that she has been avoiding caffeine. She states that this highest HR that she has noticed has been in the 80s. Normal echo in 2013. Will forward to Dr. Irish Lack for review and see if he would like to order a monitor.

## 2018-03-14 NOTE — Telephone Encounter (Signed)
Ok for 30 day event monitor

## 2018-03-15 ENCOUNTER — Other Ambulatory Visit: Payer: Self-pay

## 2018-03-15 ENCOUNTER — Emergency Department (HOSPITAL_COMMUNITY)
Admission: EM | Admit: 2018-03-15 | Discharge: 2018-03-15 | Disposition: A | Discharge status: Home / Self Care | Payer: Managed Care, Other (non HMO) | Attending: Emergency Medicine | Admitting: Emergency Medicine

## 2018-03-15 ENCOUNTER — Emergency Department (HOSPITAL_COMMUNITY): Payer: Managed Care, Other (non HMO)

## 2018-03-15 ENCOUNTER — Encounter (HOSPITAL_COMMUNITY): Payer: Self-pay | Admitting: Emergency Medicine

## 2018-03-15 DIAGNOSIS — Z87891 Personal history of nicotine dependence: Secondary | ICD-10-CM | POA: Insufficient documentation

## 2018-03-15 DIAGNOSIS — R002 Palpitations: Secondary | ICD-10-CM

## 2018-03-15 DIAGNOSIS — F419 Anxiety disorder, unspecified: Secondary | ICD-10-CM | POA: Diagnosis not present

## 2018-03-15 DIAGNOSIS — I10 Essential (primary) hypertension: Secondary | ICD-10-CM | POA: Diagnosis not present

## 2018-03-15 DIAGNOSIS — Z79899 Other long term (current) drug therapy: Secondary | ICD-10-CM | POA: Diagnosis not present

## 2018-03-15 LAB — BASIC METABOLIC PANEL
Anion gap: 12 (ref 5–15)
BUN: 15 mg/dL (ref 6–20)
CO2: 24 mmol/L (ref 22–32)
Calcium: 9.4 mg/dL (ref 8.9–10.3)
Chloride: 105 mmol/L (ref 98–111)
Creatinine, Ser: 0.91 mg/dL (ref 0.44–1.00)
GFR calc Af Amer: >60 mL/min (ref >60)
GFR calc non Af Amer: >60 mL/min (ref >60)
Glucose, Bld: 121 mg/dL — ABNORMAL HIGH (ref 70–99)
Potassium: 3.8 mmol/L (ref 3.5–5.1)
Sodium: 141 mmol/L (ref 135–145)

## 2018-03-15 LAB — CBC
HCT: 43.3 % (ref 36.0–46.0)
Hemoglobin: 12.9 g/dL (ref 12.0–15.0)
MCH: 25.9 pg — ABNORMAL LOW (ref 26.0–34.0)
MCHC: 29.8 g/dL — ABNORMAL LOW (ref 30.0–36.0)
MCV: 86.9 fL (ref 80.0–100.0)
Platelets: 331 10*3/uL (ref 150–400)
RBC: 4.98 MIL/uL (ref 3.87–5.11)
RDW: 13.9 % (ref 11.5–15.5)
WBC: 5.3 10*3/uL (ref 4.0–10.5)
nRBC: 0 % (ref 0.0–0.2)

## 2018-03-15 LAB — I-STAT BETA HCG BLOOD, ED (MC, WL, AP ONLY): I-stat hCG, quantitative: 6.6 m[IU]/mL — ABNORMAL HIGH (ref <5)

## 2018-03-15 LAB — I-STAT TROPONIN, ED: Troponin i, poc: 0 ng/mL (ref 0.00–0.08)

## 2018-03-15 NOTE — Discharge Instructions (Addendum)
Please return to the Emergency Department for any new or worsening symptoms or if your symptoms do not improve. Please be sure to follow up with your Primary Care Physician as soon as possible regarding your visit today. If you do not have a Primary Doctor please use the resources below to establish one. Please continue to take your daily medications as prescribed by your cardiologist/primary care doctor. Please schedule a follow-up appointment with your cardiologist.  Contact a health care provider if: You continue to have a fast or irregular heartbeat after 24 hours. Your palpitations occur more often. Get help right away if: You have chest pain or shortness of breath. You have a severe headache. You feel dizzy or you faint.  RESOURCE GUIDE  Chronic Pain Problems: Contact [facility]  [phone] Patients need to be referred by their primary care doctor.  Insufficient Money for Medicine: Contact [employer]:  call "211" or [place] [phone].  No Primary Care Doctor: Call [facility]  [phone] - can help you locate a primary care doctor that  accepts your insurance, provides certain services, etc. Physician Referral Service- [phone]  Agencies that provide inexpensive medical care: [facility]  [facility]  [phone] [facility]  [phone] [facility]  [phone] [facility]  [phone] [facility]  [phone]  [place] Providers: [facility]- [address], [address]  [phone], Mon-Fri 9am-7pm, Sat 9am-1pm [place], [address], [address] [place]  [place]- [address]  [place], [address], [phone]  Only accepts [place] patients after  they have their name  applied to their card  Self Pay (no insurance) in [facility]: Sickle Cell Patients: Dr Kevan Ny, [facility]  [place], [facility]- [place]  [facility] [place]- [address], [facility]- see information above (Speak to [employer] if you do not have insurance)       -  [facility]- [place], [place] [facility],  [place], [place]  Dr Vista Lawman-  [address], [address], [place], [place] [facility]- [address] [phone]       -  [facility] [place]- [address], [place], also [address] [phone]       -    [facility]- [address], [place], 1st & 3rd Saturday   every month, 10am-1pm  1) Find a Doctor and Pay Out of Pocket Although you won't have to find out who is covered by your insurance plan, it is a good idea to ask around and get recommendations. You will then need to call the office and see if the doctor you have chosen will accept you as a new patient and what types of options they offer  for patients who are self-pay. Some doctors offer discounts or will set up payment plans for their patients who do not have insurance, but you will need to ask so you aren't surprised when you get to your appointment.  2) Contact Your Local Health Department Not all health departments have doctors that can see patients for sick visits, but many do, so it is worth a call to see if yours does. If you don't know where your local health department is, you can check in your phone book. The CDC also has a tool to help you locate your state's health department, and many state websites also have listings of all of their local health departments.  3) Find a [facility] If your illness is not likely to be very severe or complicated, you may want to try a walk in clinic. These are popping up all over the country in pharmacies, drugstores, and shopping centers. They're usually staffed by nurse practitioners or physician assistants that have been trained to treat common illnesses and complaints. They're usually fairly quick and inexpensive. However, if you have serious medical issues or chronic medical problems, these are probably not your best option  [facility] [place], [facility], [address], [place], phone [phone] or [phone].  Monday - Friday, call for an appointment. [place], [facility], [address], [place], phone [phone] or [phone].  Monday - Friday, call for an appointment.  Abuse/Neglect: [place] [phone] [place] [phone] (After Hours)  Emergency Shelter:  [facility] [phone]  Maternity Homes: Room at the [place] [phone] [place] [phone]  MRSA Hotline #:   9166896527  [facility] [place] Dept. [address]         [place] Phone:  [phone]                                  Phone:  [phone]                   Phone:  [phone]  [facility], [place] in [place], [address],                                  [place] [phone] or [phone] (After  Hours)   [place]  Substance Abuse Resources: [facility]  [phone]  [facility] [phone] [facility] [place] [phone] Chinita Pester [phone] Residential & Outpatient Substance Abuse Program  [phone]  Psychological Services: [place]  [phone] [facility]  [phone] [facility], [place] [address], [place], [place]: [phone] or [phone], [employer]  Dental Assistance  If unable to pay or uninsured, contact:  [facility] or [facility]. to become qualified for the adult dental clinic.  Patients with Medicaid: [facility] [phone], [place] [address] [phone]  If unable to pay, or uninsured, contact [facility] [phone]) or [place] [phone] in [place], [place] in [facility]) to become qualified for the adult dental clinic   Other [place]- [place], [place], [place], [address], [place], [place], 2nd and 4th Thursday of the month at 6:30am.  10 clients each day by appointment, can sometimes see walk-in patients if someone does not show for an appointment. [facility]- [address]er [place], [place], [address], [place], [place], [place], [address], [place] Department- [phone] [place] [facility] [phone]

## 2018-03-15 NOTE — ED Notes (Signed)
Pt stable, ambulatory, and verbalizes understanding of d/c instructions.  

## 2018-03-15 NOTE — ED Provider Notes (Signed)
[facility] EMERGENCY DEPARTMENT Provider Note   CSN: [id number] Arrival date & time: 03/15/18  0735     History   Chief Complaint Chief Complaint  Patient presents with  . Palpitations    HPI Madison Delgado is a 57 y.o. female presenting for palpations.  Patient states that she has had a history of palpitations since she was 57 years old and has been seen by cardiology, Dr. Irish Lack for this problem in the past.  Patient states that over the past 4 weeks she has had increasing palpitations.  Patient states that her palpitation feels like and "extra beat "that lasts for one single second.  Patient denies change in nature of her palpitations however states that over the past 4 weeks that she has had a single palpitation occur 2-3 times daily which is increased from her 2-3 times weekly prior.  Patient states that she currently has a plan with her cardiologist for Holter monitor in the near future.  Patient has been treating her symptoms with atenolol 75 mg daily as prescribed by her cardiologist.  Additionally patient states that she has been feeling increasing anxiety related to her palpitations over the past week, patient states that she has been taking 1.5 mg Xanax daily for her anxiety/palpitations as directed by her primary care doctor.  Patient denies pain associated with her palpitation, denies experiencing multiple palpitations in a row, states that they are always 1 single palpitation that lasts approximately 1 seconds before dissipating.  Patient denies chest pain, shortness of breath, dyspnea on exertion, extremity swelling, nausea/vomiting, diarrhea, abdominal pain or any other symptoms.  Patient states that her last palpitation was this morning when she first got to work which prompted her to come to the emergency department.  Patient states that she has not had a palpitation since earlier this morning.  HPI  Past Medical History:  Diagnosis Date  .  Fibroids 04/22/2013  . History of stress test    a. ETT 5/17: normal  . Hyperlipidemia   . Hypertension   . Obesity   . Palpitations   . Postmenopausal bleeding 04/22/2013  . S/P hysterectomy 04/22/2013  . Seasonal and perennial allergic rhinitis 03/18/2017    Patient Active Problem List   Diagnosis Date Noted  . Inappropriate sinus tachycardia 12/24/2017  . Seasonal and perennial allergic rhinitis 03/18/2017  . Postmenopausal bleeding 04/22/2013  . Fibroids 04/22/2013  . S/P hysterectomy 04/22/2013  . Palpitations   . Hypertension   . Hyperlipidemia   . Obesity     Past Surgical History:  Procedure Laterality Date  . ADENOIDECTOMY    . BILATERAL SALPINGECTOMY Bilateral 04/22/2013   Procedure: BILATERAL SALPINGECTOMY;  Surgeon: Elveria Royals, MD;  Location: [facility];  Service: Gynecology;  Laterality: Bilateral;  . CARPAL TUNNEL RELEASE    . ROBOTIC ASSISTED TOTAL HYSTERECTOMY N/A 04/22/2013   Procedure: ROBOTIC ASSISTED TOTAL HYSTERECTOMY;  Surgeon: Elveria Royals, MD;  Location: [facility];  Service: Gynecology;  Laterality: N/A;  3 1/2 hrs.  . TONSILLECTOMY    . WISDOM TOOTH EXTRACTION       OB History   None      Home Medications    Prior to Admission medications   Medication Sig Start Date End Date Taking? Authorizing Provider  ALPRAZolam Duanne Moron) 0.5 MG tablet Take 0.5 mg by mouth 2 (two) times daily as needed for anxiety or sleep.    Yes [provider]  atenolol (TENORMIN) 50 MG tablet Take 1  tablet (50 mg total) by mouth daily. May take additional 25 mg (1/2 tablet) daily PRN for palpitations Patient taking differently: Take 75 mg by mouth daily. May take additional 25 mg (1/2 tablet) daily PRN for palpitations 03/06/18  Yes Jettie Booze, MD  Fluticasone Propionate Truett Perna) 93 MCG/ACT EXHU Place 2 sprays into both nostrils 2 (two) times daily as needed. 03/13/17  Yes Valentina Shaggy, MD  Multiple Vitamin (MULTIVITAMIN) capsule Take 1  capsule by mouth daily.   Yes [provider]  olmesartan (BENICAR) 40 MG tablet Take 40 mg by mouth daily.    Yes [provider]  diclofenac (VOLTAREN) 75 MG EC tablet Take 1 tablet (75 mg total) by mouth 2 (two) times daily. Patient not taking: Reported on 03/15/2018 12/13/16   Wallene Huh, DPM  diclofenac (VOLTAREN) 75 MG EC tablet Take 1 tablet (75 mg total) by mouth 2 (two) times daily. Patient not taking: Reported on 03/15/2018 09/02/17   Wallene Huh, DPM  fluticasone [facility]) 50 MCG/ACT nasal spray Place 2 sprays into both nostrils daily. Patient not taking: Reported on 03/15/2018 02/26/16   Hedges, Dellis Filbert, PA-C  montelukast (SINGULAIR) 10 MG tablet Take 1 tablet (10 mg total) by mouth at bedtime. Patient not taking: Reported on 03/15/2018 03/13/17 04/12/17  Valentina Shaggy, MD  Olopatadine HCl (PAZEO) 0.7 % SOLN Place 1 drop into both eyes daily as needed. Patient not taking: Reported on 03/15/2018 03/13/17   Valentina Shaggy, MD    Family History Family History  Problem Relation Age of Onset  . Diabetes Mother   . Hypertension Mother   . Stroke Mother   . Coronary artery disease Father   . Heart attack Father   . Hypertension Sister   . Asthma Sister   . Breast cancer Neg Hx   . Allergic rhinitis Neg Hx   . Angioedema Neg Hx   . Atopy Neg Hx   . Eczema Neg Hx   . Immunodeficiency Neg Hx   . Urticaria Neg Hx     Social History Social History   Tobacco Use  . Smoking status: Former Research scientist (life sciences)  . Smokeless tobacco: Never Used  Substance Use Topics  . Alcohol use: No  . Drug use: No     Allergies   Pork allergy   Review of Systems Review of Systems  Constitutional: Negative.  Negative for chills and fever.  HENT: Negative.  Negative for rhinorrhea and sore throat.   Eyes: Negative.  Negative for visual disturbance.  Respiratory: Negative.  Negative for cough and shortness of breath.   Cardiovascular: Positive for palpitations.  Negative for chest pain and leg swelling.  Gastrointestinal: Negative.  Negative for abdominal pain, blood in stool, diarrhea, nausea and vomiting.  Genitourinary: Negative.  Negative for dysuria and hematuria.  Musculoskeletal: Negative.  Negative for arthralgias and myalgias.  Skin: Negative.  Negative for rash.  Neurological: Negative.  Negative for dizziness, weakness and headaches.  Psychiatric/Behavioral: The patient is nervous/anxious.    Physical Exam Updated Vital Signs BP 122/73   Pulse 62   Temp 98.2 F (36.8 C)   Resp 18   SpO2 100%   Physical Exam  Constitutional: She appears well-developed and well-nourished. No distress.  HENT:  Head: Normocephalic and atraumatic.  Right Ear: External ear normal.  Left Ear: External ear normal.  Nose: Nose normal.  Eyes: Pupils are equal, round, and reactive to light. Conjunctivae and EOM are normal.  Neck: Trachea normal, normal range  of motion, full passive range of motion without pain and phonation normal. Neck supple. No tracheal deviation present.  Cardiovascular: Normal rate, regular rhythm, normal heart sounds and intact distal pulses.  Pulses:      Radial pulses are 2+ on the right side, and 2+ on the left side.       Dorsalis pedis pulses are 2+ on the right side, and 2+ on the left side.       Posterior tibial pulses are 2+ on the right side, and 2+ on the left side.  Pulmonary/Chest: Effort normal. No respiratory distress.  Abdominal: Soft. There is no tenderness. There is no rigidity, no rebound, no guarding, no tenderness at McBurney's point and negative Murphy's sign.  Well-healed surgical scar to lower abdomen from previous hysterectomy.  Musculoskeletal: Normal range of motion.       Right lower leg: Normal.       Left lower leg: Normal.  Neurological: She is alert. GCS eye subscore is 4. GCS verbal subscore is 5. GCS motor subscore is 6.  Speech is clear and goal oriented, follows commands Major Cranial nerves  without deficit, no facial droop Normal strength in upper and lower extremities bilaterally including dorsiflexion and plantar flexion, strong and equal grip strength Sensation normal to light touch Moves extremities without ataxia, coordination intact Normal gait  Skin: Skin is warm and dry. Capillary refill takes less than 2 seconds.  Psychiatric: She has a normal mood and affect. Her behavior is normal.   ED Treatments / Results  Labs (all labs ordered are listed, but only abnormal results are displayed) Labs Reviewed  BASIC METABOLIC PANEL - Abnormal; Notable for the following components:      Result Value   Glucose, Bld 121 (*)    All other components within normal limits  CBC - Abnormal; Notable for the following components:   MCH 25.9 (*)    MCHC 29.8 (*)    All other components within normal limits  I-STAT BETA HCG BLOOD, ED (MC, WL, AP ONLY) - Abnormal; Notable for the following components:   I-stat hCG, quantitative 6.6 (*)    All other components within normal limits  I-STAT TROPONIN, ED    EKG EKG Interpretation  Date/Time:  Saturday March 15 2018 07:38:07 EDT Ventricular Rate:  76 PR Interval:  140 QRS Duration: 86 QT Interval:  342 QTC Calculation: 384 R Axis:   39 Text Interpretation:  Normal sinus rhythm Normal ECG Confirmed by Quintella Reichert [phone]) on 03/15/2018 8:01:20 AM Also confirmed by Quintella Reichert [phone]), editor Lynder Parents [phone])  on 03/15/2018 10:38:15 AM   Radiology Dg Chest 2 View  Result Date: 03/15/2018 CLINICAL DATA:  Palpitations worsening for 3 weeks, history hypertension, former smoker EXAM: CHEST - 2 VIEW COMPARISON:  08/28/2010 FINDINGS: Upper normal heart size. Mediastinal contours and pulmonary vascularity normal. Lungs clear. No acute infiltrate, pleural effusion or pneumothorax. Mild levoconvex midthoracic scoliosis. IMPRESSION: No acute abnormalities. Electronically Signed   By: Lavonia Dana M.D.   On: 03/15/2018 08:30     Procedures Procedures (including critical care time)  Medications Ordered in ED Medications - No data to display   Initial Impression / Assessment and Plan / ED Course  I have reviewed the triage vital signs and the nursing notes.  Pertinent labs & imaging results that were available during my care of the patient were reviewed by me and considered in my medical decision making (see chart for details).  Clinical Course as of Mar 15 1306  Sat Mar 15, 2018  0859 Patient with history of hysterectomy and bilateral salpingectomy in 2014.  Believe this to be lab error today.  Discussed with Dr. Ralene Bathe who agrees.  No further investigation warranted at this time.  I-stat hCG, quantitative(!): 6.6 [BM]  1306 Patient reassessed resting comfortably talking with friend at bedside.  Denies recurrence of palpitations during emergency room visit today.   [BM]    Clinical Course User Index [BM] Deliah Boston, PA-C   57 year old female with history of palpitations presenting today for increasing palpitations.  Patient with single palpitation this morning, 2-3 palpitations daily lasting less than 1 second.  Patient endorses increase stress with school/work.  Patient denying any and all pain.  Patient denies recurrence of palpitations here in emergency department.  Troponin negative EKG without abnormalities reviewed by Dr. Ralene Bathe CBC nonacute BMP nonacute Chest x-ray negative  Patient afebrile, not tachycardic, normotensive, not tachypneic, SPO2 100% on room air, resting comfortably and in no acute distress.  Patient has been monitored in emergency department for multiple hours without recurrence of palpitations or arrhythmias noted on cardiac monitor.  Do not suspect ACS/PE or life-threatening arrhythmia.  Patient's case discussed with Dr. Ralene Bathe who agrees with discharge and outpatient follow-up with cardiology at this time.  Patient informed to take her medications as prescribed by her  cardiologist/PCP.  At this time there does not appear to be any evidence of an acute emergency medical condition and the patient appears stable for discharge with appropriate outpatient follow up. Diagnosis was discussed with patient who verbalizes understanding of care plan and is agreeable to discharge. I have discussed return precautions with patient who verbalizes understanding of return precautions. Patient strongly encouraged to follow-up with her cardiologist. All questions answered.   Note: Portions of this report may have been transcribed using voice recognition software. Every effort was made to ensure accuracy; however, inadvertent computerized transcription errors may still be present. Final Clinical Impressions(s) / ED Diagnoses   Final diagnoses:  Palpitations    ED Discharge Orders    None       Gari Crown 03/15/18 1313    Quintella Reichert, MD 03/16/18 [phone]

## 2018-03-15 NOTE — ED Triage Notes (Signed)
Pt states she has been having worsening palpitations for 3 weeks. She has been trying Xanax to see if it was her nerves but the palpitations are getting worse and more frequent. She has not had an EKG or any cardiac work up completed.

## 2018-03-15 NOTE — ED Notes (Signed)
Patient is back in bed on the monitor

## 2018-03-15 NOTE — ED Notes (Signed)
Walked patient to the bathroom patient did well 

## 2018-03-15 NOTE — ED Notes (Signed)
Patient transported to X-ray 

## 2018-03-17 ENCOUNTER — Telehealth: Payer: Self-pay | Admitting: Interventional Cardiology

## 2018-03-17 DIAGNOSIS — R002 Palpitations: Secondary | ICD-10-CM

## 2018-03-17 NOTE — Telephone Encounter (Signed)
New Message:   Patient calling for a sooner appt. Patient states she was in hospital Saturday morning 03/15/18. Please call Patient back. Need appt made after 3 pm.

## 2018-03-17 NOTE — Telephone Encounter (Signed)
Returned call to patient. Patient was seen in the ER this weekend for palpitations. EKG was normal. Made patient aware that Dr. Irish Lack would like for her to wear a 30 day event monitor (see telephone encounter 03/06/18). Monitor ordered and appointment made for 03/20/18 at 3:00 PM.

## 2018-03-17 NOTE — Telephone Encounter (Signed)
See telephone encounter 11/4.

## 2018-03-20 ENCOUNTER — Ambulatory Visit (INDEPENDENT_AMBULATORY_CARE_PROVIDER_SITE_OTHER): Payer: Managed Care, Other (non HMO)

## 2018-03-20 ENCOUNTER — Other Ambulatory Visit: Payer: Self-pay | Admitting: Interventional Cardiology

## 2018-03-20 DIAGNOSIS — R002 Palpitations: Secondary | ICD-10-CM | POA: Diagnosis not present

## 2018-03-22 ENCOUNTER — Telehealth: Payer: Self-pay | Admitting: Internal Medicine

## 2018-03-22 NOTE — Telephone Encounter (Signed)
Called Madison Delgado to discuss her holter activation for 4 beats of NSVT, with sinus rhythm in the 70s before and after. She had palpitations at that time. She is currently feeling well and asymptomatic. She said that if her symptoms recur she will push the button again but currently at a party with her daughter and feeling ok.

## 2018-03-24 NOTE — Telephone Encounter (Signed)
OK. EF has been normal in the past.

## 2018-04-25 ENCOUNTER — Telehealth: Payer: Self-pay | Admitting: Interventional Cardiology

## 2018-04-25 DIAGNOSIS — R0602 Shortness of breath: Secondary | ICD-10-CM

## 2018-04-25 NOTE — Telephone Encounter (Signed)
Patient made aware of monitor results and recommendations to continue atenolol. Patient states that she does have SOB on exertion. Echo ordered and scheduled for 12/23. Patient verbalized understanding to all instruction and thanked me for the call.

## 2018-04-25 NOTE — Telephone Encounter (Signed)
-----   Message from Jettie Booze, MD sent at 04/25/2018  9:55 AM EST ----- Short runs of arrhythmia.  If she is feeling any further [facility], would recheck echo as last one was in 2019. Otherwise, continue atenolol.

## 2018-04-25 NOTE — Telephone Encounter (Signed)
Per pt call - stated she would like a call back needs the results of her Holter monitor please.

## 2018-05-05 ENCOUNTER — Ambulatory Visit (HOSPITAL_COMMUNITY): Payer: Managed Care, Other (non HMO) | Attending: Cardiovascular Disease

## 2018-05-05 ENCOUNTER — Other Ambulatory Visit: Payer: Self-pay

## 2018-05-05 ENCOUNTER — Encounter (INDEPENDENT_AMBULATORY_CARE_PROVIDER_SITE_OTHER): Payer: Self-pay

## 2018-05-05 DIAGNOSIS — R0602 Shortness of breath: Secondary | ICD-10-CM | POA: Diagnosis not present

## 2018-05-27 ENCOUNTER — Encounter: Payer: Self-pay | Admitting: Interventional Cardiology

## 2018-05-27 ENCOUNTER — Ambulatory Visit: Payer: Managed Care, Other (non HMO) | Admitting: Interventional Cardiology

## 2018-05-27 VITALS — BP 140/88 | HR 69 | Ht 60.0 in | Wt 197.0 lb

## 2018-05-27 DIAGNOSIS — I10 Essential (primary) hypertension: Secondary | ICD-10-CM

## 2018-05-27 DIAGNOSIS — R0683 Snoring: Secondary | ICD-10-CM | POA: Diagnosis not present

## 2018-05-27 DIAGNOSIS — M7989 Other specified soft tissue disorders: Secondary | ICD-10-CM

## 2018-05-27 DIAGNOSIS — G4730 Sleep apnea, unspecified: Secondary | ICD-10-CM | POA: Diagnosis not present

## 2018-05-27 DIAGNOSIS — R002 Palpitations: Secondary | ICD-10-CM | POA: Diagnosis not present

## 2018-05-27 NOTE — Progress Notes (Signed)
Cardiology Office Note   Date:  05/27/2018   ID:  Madison Delgado, DOB [birth year], MRN [id number]  PCP:  Wenda Low, MD    No chief complaint on file.  Palpitations  Wt Readings from Last 3 Encounters:  05/27/18 197 lb (89.4 kg)  12/25/17 204 lb 3.2 oz (92.6 kg)  03/13/17 194 lb 12.8 oz (88.4 kg)       History of Present Illness: Madison Delgado is a 58 y.o. female  with history of inappropriate sinus tachycardia diagnosed at [facility] treated with beta-blocker, normal LV function on 2D echo 2013, hypertension, HLD, obesity. Normal GXT 2017.  Last visit with Madison Delgado showed: "Last saw Dr. Irish Lack 08/2016 at which time she complained of palpitations and heart racing if she gets stressed.  She feels like she is going to die from the palpitations although she has been told by multiple physicians that she will not even after all these years.  He told her to take atenolol 50 mg daily and a half extra half a tablet as needed.  Magnesium has not helped in the past.  Blood pressure was up that day he asked her to minimize salt and increase exercise.  Could increase valsartan if needed.  Patient comes in for yearly f/u. She still is having palpitations mostly controlled with atenolol.   "Echo showed: - Left ventricle: The cavity size was normal. Systolic function was   normal. The estimated ejection fraction was in the range of 60%   to 65%. Wall motion was normal; there were no regional wall   motion abnormalities. Left ventricular diastolic function   parameters were normal. - Aortic valve: There was no regurgitation. - Mitral valve: Transvalvular velocity was within the normal range.   There was no evidence for stenosis. There was trivial   regurgitation. - Right ventricle: The cavity size was normal. Wall thickness was   normal. Systolic function was normal. - Atrial septum: No defect or patent foramen ovale was identified. - Tricuspid  valve: There was trivial regurgitation. - Pulmonary arteries: Systolic pressure was mildly increased. PA   peak pressure: 41 mm Hg (S)."   Sx felt better over Christmas vacation, when stress when was decreased.  She is teaching at [employer].  SHe is getting Masters in [employer].    Anxiety increased when she had to go back to work.  She is trying to avoid Xanax use.    She has had a sleep study recommended  In the past but she was too claustrophobic and did not have the test.  Denies : Chest pain. Dizziness. Leg edema. Nitroglycerin use. Orthopnea.  Paroxysmal nocturnal dyspnea. Shortness of breath. Syncope.    Past Medical History:  Diagnosis Date  . Fibroids 04/22/2013  . History of stress test    a. ETT 5/17: normal  . Hyperlipidemia   . Hypertension   . Obesity   . Palpitations   . Postmenopausal bleeding 04/22/2013  . S/P hysterectomy 04/22/2013  . Seasonal and perennial allergic rhinitis 03/18/2017    Past Surgical History:  Procedure Laterality Date  . ADENOIDECTOMY    . BILATERAL SALPINGECTOMY Bilateral 04/22/2013   Procedure: BILATERAL SALPINGECTOMY;  Surgeon: Elveria Royals, MD;  Location: [facility];  Service: Gynecology;  Laterality: Bilateral;  . CARPAL TUNNEL RELEASE    . ROBOTIC ASSISTED TOTAL HYSTERECTOMY N/A 04/22/2013   Procedure: ROBOTIC ASSISTED TOTAL HYSTERECTOMY;  Surgeon: Elveria Royals, MD;  Location: [facility];  Service:  Gynecology;  Laterality: N/A;  3 1/2 hrs.  . TONSILLECTOMY    . WISDOM TOOTH EXTRACTION       Current Outpatient Medications  Medication Sig Dispense Refill  . ALPRAZolam (XANAX) 0.5 MG tablet Take 0.5 mg by mouth 2 (two) times daily as needed for anxiety or sleep.     Marland Kitchen atenolol (TENORMIN) 50 MG tablet Take 1 tablet (50 mg total) by mouth daily. May take additional 25 mg (1/2 tablet) daily PRN for palpitations (Patient taking differently: Take 75 mg by mouth daily. May take additional 25 mg (1/2 tablet) daily PRN for palpitations) 90  tablet 3  . fluticasone (FLONASE) 50 MCG/ACT nasal spray Place 2 sprays into both nostrils daily. 9.9 g 2  . Fluticasone Propionate (XHANCE) 93 MCG/ACT EXHU Place 2 sprays into both nostrils 2 (two) times daily as needed. 32 mL 5  . Multiple Vitamin (MULTIVITAMIN) capsule Take 1 capsule by mouth daily.    Marland Kitchen olmesartan (BENICAR) 40 MG tablet Take 40 mg by mouth daily.      No current facility-administered medications for this visit.     Allergies:   Pork allergy    Social History:  The patient  reports that she has quit smoking. She has never used smokeless tobacco. She reports that she does not drink alcohol or use drugs.   Family History:  The patient's family history includes Asthma in her sister; Coronary artery disease in her father; Diabetes in her mother; Heart attack in her father; Hypertension in her mother and sister; Stroke in her mother.    ROS:  Please see the history of present illness.   Otherwise, review of systems are positive for palpitations with stress.   All other systems are reviewed and negative.    PHYSICAL EXAM: VS:  BP 140/88   Pulse 69   Ht 5' (1.524 m)   Wt 197 lb (89.4 kg)   SpO2 98%   BMI 38.47 kg/m  , BMI Body mass index is 38.47 kg/m. GEN: Well nourished, well developed, in no acute distress  HEENT: normal  Neck: no JVD, carotid bruits, or masses Cardiac: RRR; no murmurs, rubs, or gallops,no edema  Respiratory:  clear to auscultation bilaterally, normal work of breathing GI: soft, nontender, nondistended, + BS MS: no deformity or atrophy  Skin: warm and dry, no rash Neuro:  Strength and sensation are intact Psych: euthymic mood, full affect    Recent Labs: 03/15/2018: BUN 15; Creatinine, Ser 0.91; Hemoglobin 12.9; Platelets 331; Potassium 3.8; Sodium 141   Lipid Panel No results found for: CHOL, TRIG, HDL, CHOLHDL, VLDL, LDLCALC, LDLDIRECT   Other studies Reviewed: Additional studies/ records that were reviewed today with results  demonstrating: echo result reviewed.   ASSESSMENT AND PLAN:  1. Palpitations: Supravalvular in origin.  Occurs with stress.  COntinue beta blocker.  2. HTN: The current medical regimen is effective;  continue present plan and medications.  COntinue current meds. 3.   Leg swelling: Ocasional.  Elevate legs.  4.  She has sx of sleep apnea, snoring, fatigue, daytime somnolence, observed apnea.  May be causing some of the pulmonary HTN. Will try to coordinated sleep study, possibly at home.     Current medicines are reviewed at length with the patient today.  The patient concerns regarding her medicines were addressed.  The following changes have been made:  No change  Labs/ tests ordered today include:  No orders of the defined types were placed in this encounter.  Recommend 150 minutes/week of aerobic exercise Low fat, low carb, high fiber diet recommended  Disposition:   FU in 1 year   Signed, Larae Grooms, MD  05/27/2018 3:54 PM    [facility] [address], [place], [place]  [address] Phone: [phone]; Fax: [phone]

## 2018-05-27 NOTE — Patient Instructions (Signed)
Medication Instructions:  Your physician recommends that you continue on your current medications as directed. Please refer to the Current Medication list given to you today.  If you need a refill on your cardiac medications before your next appointment, please call your pharmacy.   Lab work: None Ordered  If you have labs (blood work) drawn today and your tests are completely normal, you will receive your results only by: Marland Kitchen MyChart Message (if you have MyChart) OR . A paper copy in the mail If you have any lab test that is abnormal or we need to change your treatment, we will call you to review the results.  Testing/Procedures: Your physician has recommended that you have a sleep study. This test records several body functions during sleep, including: brain activity, eye movement, oxygen and carbon dioxide blood levels, heart rate and rhythm, breathing rate and rhythm, the flow of air through your mouth and nose, snoring, body muscle movements, and chest and belly movement.  Follow-Up: At [facility], you and your health needs are our priority.  As part of our continuing mission to provide you with exceptional heart care, we have created designated Provider Care Teams.  These Care Teams include your primary Cardiologist (physician) and Advanced Practice Providers (APPs -  Physician Assistants and Nurse Practitioners) who all work together to provide you with the care you need, when you need it. . You will need a follow up appointment in 1 year.  Please call our office 2 months in advance to schedule this appointment.  You may see Casandra Doffing, MD or one of the following Advanced Practice Providers on your designated Care Team:   . Lyda Jester, PA-C . Dayna Dunn, PA-C . Ermalinda Barrios, PA-C  Any Other Special Instructions Will Be Listed Below (If Applicable).

## 2018-11-21 ENCOUNTER — Other Ambulatory Visit: Payer: Self-pay | Admitting: Internal Medicine

## 2018-11-21 DIAGNOSIS — Z139 Encounter for screening, unspecified: Secondary | ICD-10-CM

## 2019-01-05 ENCOUNTER — Ambulatory Visit: Payer: Managed Care, Other (non HMO)

## 2019-02-17 ENCOUNTER — Other Ambulatory Visit: Payer: Self-pay

## 2019-02-17 ENCOUNTER — Ambulatory Visit
Admission: RE | Admit: 2019-02-17 | Discharge: 2019-02-17 | Disposition: A | Discharge status: Home / Self Care | Payer: Managed Care, Other (non HMO) | Source: Ambulatory Visit | Attending: Internal Medicine | Admitting: Internal Medicine

## 2019-02-17 DIAGNOSIS — Z139 Encounter for screening, unspecified: Secondary | ICD-10-CM

## 2019-04-21 ENCOUNTER — Ambulatory Visit (INDEPENDENT_AMBULATORY_CARE_PROVIDER_SITE_OTHER): Payer: Managed Care, Other (non HMO) | Admitting: Allergy & Immunology

## 2019-04-21 ENCOUNTER — Other Ambulatory Visit: Payer: Self-pay

## 2019-04-21 ENCOUNTER — Encounter: Payer: Self-pay | Admitting: Allergy & Immunology

## 2019-04-21 VITALS — BP 162/88 | HR 71 | Temp 97.4°F | Resp 16 | Ht 63.0 in | Wt 203.2 lb

## 2019-04-21 DIAGNOSIS — J3089 Other allergic rhinitis: Secondary | ICD-10-CM

## 2019-04-21 DIAGNOSIS — T7800XD Anaphylactic reaction due to unspecified food, subsequent encounter: Secondary | ICD-10-CM | POA: Diagnosis not present

## 2019-04-21 DIAGNOSIS — H9313 Tinnitus, bilateral: Secondary | ICD-10-CM | POA: Diagnosis not present

## 2019-04-21 DIAGNOSIS — J302 Other seasonal allergic rhinitis: Secondary | ICD-10-CM

## 2019-04-21 NOTE — Progress Notes (Signed)
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 We are going to change her nasal toileting regimen. We are initiating budesonide nasal rinses up to BID and starting a prednisone taper today to help her to get some quicker relief. We are going to continue with the PRN use of her antihistamines, although this seems to cause palpitations. Allergen immunotherapy is an option as well. ENT referral will be considered if this continues to be a problem, particularly the tinnitus.    Plan/Recommendations:   1. Seasonal and perennial allergic rhinitis (trees, weeds, grasses, indoor molds, outdoor molds and cat) - Testing was positive to dog and Johnson grass.  - Avoidance measures provided. - Start prednisone pack provided today. - Start budesonide nasal rinses twice daily (see recipe below).  - Consider allergen immunotherapy.  2. 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   3. Return in about 4 weeks (around 05/19/2019). This can be an in-person, a virtual Webex or a telephone follow up visit.  Subjective:   Madison Delgado is a 58 y.o. female presenting today for follow up of  Chief Complaint  Patient presents with  . Allergic Rhinitis     has buzzing in her ear  . Sinus Problem    has pressure and pain in ear,     [name] has a history of the following: Patient Active Problem List   Diagnosis Date Noted  . 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 Inappropriate sinus tachycardia 12/24/2017  . Seasonal and perennial allergic rhinitis 03/18/2017  . Postmenopausal bleeding 04/22/2013  . Fibroids 04/22/2013  . S/P  hysterectomy 04/22/2013  . Palpitations   . Hypertension   . Hyperlipidemia   . Obesity     History obtained from: chart review and patient.  Madison Delgado is a 58 y.o. female presenting for a follow up visit.  She was last seen in November 2018.  At that time, she had testing that was positive to trees, weeds, grasses, indoor and outdoor molds, and cat.  We did not do intradermal testing since so much was positive.  We continued her on Xhance 2 sprays per nostril daily, cetirizine 10 mg daily, Singulair 10 mg daily, and Pazeo eyedrops.  We did discuss allergen immunotherapy.  We also recommended continued avoidance of pork.  A prescription for an epinephrine autoinjector was sent in.  She reports pressure on the left side of her nostril. She reports pressure on her left ear. She was diagnosed it with something which she cannot remember at this time. She has tried some nasal saline rinses but she seems that she had some troubles with this.  She did not feel that it worked and she said she had saline coming in the bedroom.  She has not required any antibiotics.  She has not required prednisone for this.  She has had prednisone for what sounds like some joint pain, she is unsure if it helped her ear and no symptoms at all.  This was several months ago.  She does not take antihistamines because she has palpitations.  Therefore, she does not take any antihistamines at this time.  She has never seen an  otolaryngologist for the symptoms.  She has never had a fever during the symptoms.  In all, these have been occurring since May 2020.  The only difference is that she got a dog around that time.  She never had testing that was positive to dog, but she does feel that she gets itchy eyes when she pets the dog.  Otherwise, there have been no changes to her past medical history, surgical history, family history, or social history.    Review of Systems  Constitutional: Negative.  Negative for fever,  malaise/fatigue and weight loss.  HENT: Positive for congestion, ear pain, sinus pain and tinnitus. Negative for ear discharge.   Eyes: Negative for pain, discharge and redness.  Respiratory: Negative for cough, sputum production, shortness of breath and wheezing.   Cardiovascular: Negative.  Negative for chest pain and palpitations.  Gastrointestinal: Negative for abdominal pain, constipation, diarrhea, heartburn, nausea and vomiting.  Skin: Negative.  Negative for itching and rash.  Neurological: Negative for dizziness and headaches.  Endo/Heme/Allergies: Negative for environmental allergies. Does not bruise/bleed easily.       Objective:   Blood pressure (!) 162/88, pulse 71, temperature (!) 97.4 F (36.3 C), temperature source Temporal, resp. rate 16, height 5\' 3"  (1.6 m), weight 203 lb 3.2 oz (92.2 kg), SpO2 100 %. Body mass index is 36 kg/m.   Physical Exam:  Physical Exam  Constitutional: She appears well-developed.  HENT:  Head: Normocephalic and atraumatic.  Right Ear: External ear and ear canal normal.  Left Ear: External ear and ear canal normal.  Nose: No mucosal edema, rhinorrhea, nasal deformity or septal deviation. No epistaxis. Right sinus exhibits no maxillary sinus tenderness and no frontal sinus tenderness. Left sinus exhibits no maxillary sinus tenderness and no frontal sinus tenderness.  Mouth/Throat: Uvula is midline and oropharynx is clear and moist. Mucous membranes are not pale and not dry.  TMs are dull bilaterally.  Landmarks are visualized bilaterally and they are not erythematous. There is no bulging of the tympanic membrane.  Eyes: Pupils are equal, round, and reactive to light. Conjunctivae and EOM are normal. Right eye exhibits no chemosis and no discharge. Left eye exhibits no chemosis and no discharge. Right conjunctiva is not injected. Left conjunctiva is not injected.  Cardiovascular: Normal rate, regular rhythm and normal heart sounds.   Respiratory: Effort normal and breath sounds normal. No accessory muscle usage. No tachypnea. No respiratory distress. She has no wheezes. She has no rhonchi. She has no rales. She exhibits no tenderness.  Moving air well in all lung fields.  No increased work of breathing.  No crackles.  Lymphadenopathy:    She has no cervical adenopathy.  Neurological: She is alert.  Skin: No abrasion, no petechiae and no rash noted. Rash is not papular, not vesicular and not urticarial. No erythema. No pallor.  Psychiatric: She has a normal mood and affect.     Diagnostic studies:    Allergy Studies:    Intradermal - 04/21/19 1655    Time Antigen Placed  1655    Allergen Manufacturer  Lavella Hammock    Location  Arm    Number of Test  5    Intradermal  Select    Control  Negative    [place]  Negative    Johnson  2+    Dog  2+    Mite mix  Negative       Allergy testing results were read and interpreted by myself, documented by clinical staff.  Salvatore Marvel, MD  [facility]

## 2019-04-21 NOTE — Patient Instructions (Addendum)
1. Seasonal and perennial allergic rhinitis (trees, weeds, grasses, indoor molds, outdoor molds and cat) - Testing was positive to dog and Johnson grass.  - Avoidance measures provided. - Start prednisone pack provided today. - Start budesonide nasal rinses twice daily (see recipe below).  - Consider allergen immunotherapy.  2. 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   3. Return in about 4 weeks (around 05/19/2019). This can be an in-person, a virtual Webex or a telephone follow up visit.   Please inform us of any Emergency Department visits, hospitalizations, or changes in symptoms. Call us before going to the ED for breathing or allergy symptoms since we might be able to fit you in for a sick visit. Feel free to contact us anytime with any questions, problems, or concerns.  It was a pleasure to see you again today!  Websites that have reliable patient information: 1. American Academy of Asthma, Allergy, and Immunology: www.aaaai.org 2. Food Allergy Research and Education (FARE): foodallergy.org 3. Mothers of Asthmatics: http://www.asthmacommunitynetwork.org 4. American College of Allergy, Asthma, and Immunology: www.acaai.org  "Like" [name] on Facebook and Instagram for our latest updates!      Make sure you are registered to vote! If you have moved or changed any of your contact information, you will need to get this updated before voting!  In some cases, you MAY be able to register to vote online: [email or web address]    Budesonide (Pulmicort) + Saline Irrigation/Rinse   Budesonide (Pulmicort) is an anti-inflammatory steroid medication used to decrease nasal and sinus inflammation. It is dispensed in liquid form in a vial. Although it is manufactured for use with a nebulizer, we intend for you to use it with the NeilMed Sinus Rinse bottle (preferred) or a Neti pot.    Instructions:  1) Make 238mL of  saline in the NeilMed bottle using the salt packets or your own saline recipe (see separate handout).  2) Add the entire 6mL vial of liquid Budesonide (Pulmicort) to the rinse bottle and mix together.  3) While in the shower or over the sink, tilt your head forward to a comfortable level. Put the tip of the sinus rinse bottle in your nostril and aim it towards the crown or top of your head. Gently squeeze the bottle to flush out your nose. The fluid will circulate in and out of your sinus cavities, coming back out from either nostril or through your mouth. Try not to swallow large quantities and spit it out instead.  4) Perform Budesonide (Pulmicort) + Saline irrigations 2 times daily.  Control of Dog or Cat Allergen  Avoidance is the best way to manage a dog or cat allergy. If you have a dog or cat and are allergic to dog or cats, consider removing the dog or cat from the home. If you have a dog or cat but don't want to find it a new home, or if your family wants a pet even though someone in the household is allergic, here are some strategies that may help keep symptoms at bay:  1. Keep the pet out of your bedroom and restrict it to only a few rooms. Be advised that keeping the dog or cat in only one room will not limit the allergens to that room. 2. Don't pet, hug or kiss the dog or cat; if you do, wash your hands with soap and water. 3. High-efficiency particulate air (HEPA) cleaners run continuously in a bedroom or living  room can reduce allergen levels over time. 4. Regular use of a high-efficiency vacuum cleaner or a central vacuum can reduce allergen levels. 5. Giving your dog or cat a bath at least once a week can reduce airborne allergen.  Allergy Shots   Allergies are the result of a chain reaction that starts in the immune system. Your immune system controls how your body defends itself. For instance, if you have an allergy to pollen, your immune system identifies pollen as an invader or  allergen. Your immune system overreacts by producing antibodies called Immunoglobulin E (IgE). These antibodies travel to cells that release chemicals, causing an allergic reaction.  The concept behind allergy immunotherapy, whether it is received in the form of shots or tablets, is that the immune system can be desensitized to specific allergens that trigger allergy symptoms. Although it requires time and patience, the payback can be long-term relief.  How Do Allergy Shots Work?  Allergy shots work much like a vaccine. Your body responds to injected amounts of a particular allergen given in increasing doses, eventually developing a resistance and tolerance to it. Allergy shots can lead to decreased, minimal or no allergy symptoms.  There generally are two phases: build-up and maintenance. Build-up often ranges from three to six months and involves receiving injections with increasing amounts of the allergens. The shots are typically given once or twice a week, though more rapid build-up schedules are sometimes used.  The maintenance phase begins when the most effective dose is reached. This dose is different for each person, depending on how allergic you are and your response to the build-up injections. Once the maintenance dose is reached, there are longer periods between injections, typically two to four weeks.  Occasionally doctors give cortisone-type shots that can temporarily reduce allergy symptoms. These types of shots are different and should not be confused with allergy immunotherapy shots.  Who Can Be Treated with Allergy Shots?  Allergy shots may be a good treatment approach for people with allergic rhinitis (hay fever), allergic asthma, conjunctivitis (eye allergy) or stinging insect allergy.   Before deciding to begin allergy shots, you should consider:  . The length of allergy season and the severity of your symptoms . Whether medications and/or changes to your environment can  control your symptoms . Your desire to avoid long-term medication use . Time: allergy immunotherapy requires a major time commitment . Cost: may vary depending on your insurance coverage  Allergy shots for children age 61 and older are effective and often well tolerated. They might prevent the onset of new allergen sensitivities or the progression to asthma.  Allergy shots are not started on patients who are pregnant but can be continued on patients who become pregnant while receiving them. In some patients with other medical conditions or who take certain common medications, allergy shots may be of risk. It is important to mention other medications you talk to your allergist.   When Will I Feel Better?  Some may experience decreased allergy symptoms during the build-up phase. For others, it may take as long as 12 months on the maintenance dose. If there is no improvement after a year of maintenance, your allergist will discuss other treatment options with you.  If you aren't responding to allergy shots, it may be because there is not enough dose of the allergen in your vaccine or there are missing allergens that were not identified during your allergy testing. Other reasons could be that there are high levels of the  allergen in your environment or major exposure to non-allergic triggers like tobacco smoke.  What Is the Length of Treatment?  Once the maintenance dose is reached, allergy shots are generally continued for three to five years. The decision to stop should be discussed with your allergist at that time. Some people may experience a permanent reduction of allergy symptoms. Others may relapse and a longer course of allergy shots can be considered.  What Are the Possible Reactions?  The two types of adverse reactions that can occur with allergy shots are local and systemic. Common local reactions include very mild redness and swelling at the injection site, which can happen immediately  or several hours after. A systemic reaction, which is less common, affects the entire body or a particular body system. They are usually mild and typically respond quickly to medications. Signs include increased allergy symptoms such as sneezing, a stuffy nose or hives.  Rarely, a serious systemic reaction called anaphylaxis can develop. Symptoms include swelling in the throat, wheezing, a feeling of tightness in the chest, nausea or dizziness. Most serious systemic reactions develop within 30 minutes of allergy shots. This is why it is strongly recommended you wait in your doctor's office for 30 minutes after your injections. Your allergist is trained to watch for reactions, and his or her staff is trained and equipped with the proper medications to identify and treat them.  Who Should Administer Allergy Shots?  The preferred location for receiving shots is your prescribing allergist's office. Injections can sometimes be given at another facility where the physician and staff are trained to recognize and treat reactions, and have received instructions by your prescribing allergist.

## 2019-04-22 ENCOUNTER — Encounter: Payer: Self-pay | Admitting: Allergy & Immunology

## 2019-04-22 DIAGNOSIS — H9313 Tinnitus, bilateral: Secondary | ICD-10-CM | POA: Insufficient documentation

## 2019-04-22 DIAGNOSIS — T7800XA Anaphylactic reaction due to unspecified food, initial encounter: Secondary | ICD-10-CM | POA: Insufficient documentation

## 2019-05-13 ENCOUNTER — Other Ambulatory Visit: Payer: Self-pay

## 2019-05-13 ENCOUNTER — Ambulatory Visit: Payer: Managed Care, Other (non HMO) | Admitting: Podiatry

## 2019-05-13 ENCOUNTER — Encounter: Payer: Self-pay | Admitting: Podiatry

## 2019-05-13 ENCOUNTER — Ambulatory Visit (INDEPENDENT_AMBULATORY_CARE_PROVIDER_SITE_OTHER): Payer: Managed Care, Other (non HMO) | Admitting: Otolaryngology

## 2019-05-13 ENCOUNTER — Ambulatory Visit (INDEPENDENT_AMBULATORY_CARE_PROVIDER_SITE_OTHER): Payer: Managed Care, Other (non HMO)

## 2019-05-13 VITALS — Temp 97.2°F

## 2019-05-13 DIAGNOSIS — H6982 Other specified disorders of Eustachian tube, left ear: Secondary | ICD-10-CM | POA: Diagnosis not present

## 2019-05-13 DIAGNOSIS — G629 Polyneuropathy, unspecified: Secondary | ICD-10-CM

## 2019-05-13 DIAGNOSIS — M779 Enthesopathy, unspecified: Secondary | ICD-10-CM | POA: Diagnosis not present

## 2019-05-13 MED ORDER — GABAPENTIN 300 MG PO CAPS: 300.0000 mg | ORAL_CAPSULE | Freq: Three times a day (TID) | ORAL | 3 refills | Status: DC

## 2019-05-13 NOTE — Progress Notes (Signed)
HPI: Madison Delgado is a 58 y.o. female who presents is referred by her allergist for evaluation of left ear complaints.  She describes an intermittent "static" sound in the left ear that comes and goes.  This is frequently associated with pressure in her head more on the left side..  She does have history of allergies and takes Claritin.  She has used Flonase in the past but intermittently.  She was recently seen by her allergist and treated with a short course of steroids.  She is scheduled to undergo allergy testing next week. She is referred here for ear evaluation.  Denies any yellow-green discharge from her nose.  She has not really noted hearing problems.  Denies loud noise exposure.  Past Medical History:  Diagnosis Date  . Fibroids 04/22/2013  . History of stress test    a. ETT 5/17: normal  . Hyperlipidemia   . Hypertension   . Obesity   . Palpitations   . Postmenopausal bleeding 04/22/2013  . S/P hysterectomy 04/22/2013  . Seasonal and perennial allergic rhinitis 03/18/2017   Past Surgical History:  Procedure Laterality Date  . ADENOIDECTOMY    . BILATERAL SALPINGECTOMY Bilateral 04/22/2013   Procedure: BILATERAL SALPINGECTOMY;  Surgeon: Elveria Royals, MD;  Location: [facility];  Service: Gynecology;  Laterality: Bilateral;  . CARPAL TUNNEL RELEASE    . ROBOTIC ASSISTED TOTAL HYSTERECTOMY N/A 04/22/2013   Procedure: ROBOTIC ASSISTED TOTAL HYSTERECTOMY;  Surgeon: Elveria Royals, MD;  Location: [facility];  Service: Gynecology;  Laterality: N/A;  3 1/2 hrs.  . TONSILLECTOMY    . WISDOM TOOTH EXTRACTION     Social History   Socioeconomic History  . Marital status: Divorced    Spouse name: Not on file  . Number of children: Not on file  . Years of education: Not on file  . Highest education level: Not on file  Occupational History  . Not on file  Tobacco Use  . Smoking status: Former Research scientist (life sciences)  . Smokeless tobacco: Never Used  Substance and Sexual Activity  . Alcohol use:  No  . Drug use: No  . Sexual activity: Yes    Birth control/protection: None  Other Topics Concern  . 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   Social Determinants of Health   Financial Resource Strain:   . Difficulty of Paying Living Expenses: Not on file  Food Insecurity:   . Worried About Charity fundraiser in the Last Year: Not on file  . Ran Out of Food in the Last Year: Not on file  Transportation Needs:   . Lack of Transportation (Medical): Not on file  . Lack of Transportation (Non-Medical): Not on file  Physical Activity:   . Days of Exercise per Week: Not on file  . Minutes of Exercise per Session: Not on file  Stress:   . Feeling of Stress : Not on file  Social Connections:   . Frequency of Communication with Friends and Family: Not on file  . Frequency of Social Gatherings with Friends and Family: Not on file  . Attends Religious Services: Not on file  . Active Member of Clubs or Organizations: Not on file  . Attends Archivist Meetings: Not on file  . Marital Status: Not on file   Family History  Problem Relation Age of Onset  . Diabetes  Mother   . Hypertension Mother   . Stroke Mother   . Coronary artery disease Father   . Heart attack Father   . Hypertension Sister   . Asthma Sister   . Breast cancer Neg Hx   . Allergic rhinitis Neg Hx   . Angioedema Neg Hx   . Atopy Neg Hx   . Eczema Neg Hx   . Immunodeficiency Neg Hx   . Urticaria Neg Hx    Allergies  Allergen Reactions  . Pork Allergy Swelling    Certain types of pork products cause lips to swell and SOB   Prior to Admission medications   Medication Sig Start Date End Date Taking? Authorizing Provider  ALPRAZolam Duanne Moron) 0.5 MG tablet Take 0.5 mg by mouth 2 (two) times daily as needed for anxiety or sleep.    Yes [provider]  atenolol (TENORMIN) 50 MG tablet Take 1 tablet  (50 mg total) by mouth daily. May take additional 25 mg (1/2 tablet) daily PRN for palpitations Patient taking differently: Take 75 mg by mouth daily. May take additional 25 mg (1/2 tablet) daily PRN for palpitations 03/06/18  Yes Jettie Booze, MD  Cyanocobalamin (VITAMIN B12) 500 MCG TABS Take by mouth.   Yes [provider]  fluticasone (FLONASE) 50 MCG/ACT nasal spray Place 2 sprays into both nostrils daily. 02/26/16  Yes Hedges, Dellis Filbert, PA-C  Fluticasone Propionate (XHANCE) 93 MCG/ACT EXHU Place 2 sprays into both nostrils 2 (two) times daily as needed. 03/13/17  Yes Valentina Shaggy, MD  MAGNESIUM PO Take by mouth.   Yes [provider]  Multiple Vitamin (MULTIVITAMIN) capsule Take 1 capsule by mouth daily.   Yes [provider]  olmesartan (BENICAR) 40 MG tablet Take 40 mg by mouth daily.    Yes [provider]     Positive ROS: Otherwise negative  All other systems have been reviewed and were otherwise negative with the exception of those mentioned in the HPI and as above.  Physical Exam: Constitutional: Alert, well-appearing, no acute distress Ears: External ears without lesions or tenderness. Ear canals are clear bilaterally.  TMs are clear bilaterally with good mobility on pneumatic otoscopy..  Auscultation of the ears reveal no objective tinnitus or sounds. Nasal: External nose without lesions. Septum relatively midline.  Moderate rhinitis with large inferior turbinates.. Clear middle meatus bilaterally with no signs of infection.  No polyps noted. Oral: Lips and gums without lesions. Tongue and palate mucosa without lesions. Posterior oropharynx clear. Neck: No palpable adenopathy or masses Respiratory: Breathing comfortably  Skin: No facial/neck lesions or rash noted.  Audiogram in the office today demonstrated normal hearing in both ears with SRT's of 0 dB bilaterally.  Type a tympanograms  bilaterally.  Procedures  Assessment: Normal ear and hearing evaluation. This may be related to eustachian tube dysfunction secondary to allergies.  Plan: Suggested regular use of the Flonase 2 sprays each nostril at night. She will follow-up if she notices any change in her hearing. Also gave her samples of Lipo flavonoid to try as this is beneficial in some patients with tinnitus but her symptoms really are not typical of tinnitus which is generally more of a constant sound that does not come and go.   Radene Journey, MD   CC:

## 2019-05-13 NOTE — Progress Notes (Signed)
Subjective:   Patient ID: Madison Delgado, female   DOB: 58 y.o.   MRN: [id number]   HPI Patient presents stating she is had a lot of pain in her left over her right foot and states that it is keeping her from being active.  Points to the joints and states that become gradually more of an issue and that it is not been improving and she also seems to get some nerve pain at nighttime   ROS      Objective:  Physical Exam  Neurovascular status tach with patient having some reduction of sharp dull vibratory.  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  We will get a try conservative and I did a anesthesia block 60 mg like Marcaine mixture sterile prep applied and went ahead and aspirated the second and third MPJ left getting a small amount of clear fluid injected quarter cc dexamethasone Kenalog in each joint instructed on rigid bottom shoes and also started on gabapentin to see if it will help with neuropathic condition.  Reappoint 8 weeks and if symptoms are persistent or not better will get a need to consider shortening osteotomy  X-rays indicate that there is no signs of arthritis stress fracture occurring with mild increase in the length of the second and third metatarsal bilateral

## 2019-05-14 ENCOUNTER — Encounter (INDEPENDENT_AMBULATORY_CARE_PROVIDER_SITE_OTHER): Payer: Self-pay

## 2019-05-18 ENCOUNTER — Ambulatory Visit: Payer: Managed Care, Other (non HMO) | Attending: Internal Medicine

## 2019-05-18 DIAGNOSIS — Z20822 Contact with and (suspected) exposure to covid-19: Secondary | ICD-10-CM

## 2019-05-19 ENCOUNTER — Ambulatory Visit: Payer: BC Managed Care – PPO | Admitting: Allergy & Immunology

## 2019-05-19 ENCOUNTER — Other Ambulatory Visit: Payer: Self-pay

## 2019-05-19 ENCOUNTER — Encounter: Payer: Self-pay | Admitting: Allergy & Immunology

## 2019-05-19 VITALS — BP 154/92 | HR 68 | Temp 97.9°F | Resp 16 | Ht 64.0 in | Wt 202.2 lb

## 2019-05-19 DIAGNOSIS — J302 Other seasonal allergic rhinitis: Secondary | ICD-10-CM

## 2019-05-19 DIAGNOSIS — T7800XD Anaphylactic reaction due to unspecified food, subsequent encounter: Secondary | ICD-10-CM

## 2019-05-19 DIAGNOSIS — H9313 Tinnitus, bilateral: Secondary | ICD-10-CM

## 2019-05-19 DIAGNOSIS — J3489 Other specified disorders of nose and nasal sinuses: Secondary | ICD-10-CM

## 2019-05-19 DIAGNOSIS — J3089 Other allergic rhinitis: Secondary | ICD-10-CM | POA: Diagnosis not present

## 2019-05-19 LAB — NOVEL CORONAVIRUS, NAA: SARS-CoV-2, NAA: NOT DETECTED

## 2019-05-19 NOTE — Progress Notes (Signed)
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 Seasonal and perennial allergic rhinitis (trees, weeds, grasses, indoor molds, outdoor molds and cat) - I am not convinced that allergy shots are going to provide the best relief. - If it was related to allergies, I think the prednisone and the nasal steroid would have relieved the symptoms.  - I am going to refer you to Dr. Deeann Saint office (he has someone that works with him who deals specifically with balance/tinnitus that could be helpful.   2. 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   3. Return in about 3 months (around 08/17/2019). This can be an in-person, a virtual Webex or a telephone follow up visit.   Subjective:   Madison Delgado is a 59 y.o. female presenting today for follow up of  Chief Complaint  Patient presents with  . Allergic Rhinitis     Madison Delgado has a history of the following: Patient Active Problem List   Diagnosis Date Noted  . 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 Inappropriate sinus tachycardia 12/24/2017  . Seasonal and perennial allergic rhinitis 03/18/2017  . Postmenopausal bleeding 04/22/2013  . Fibroids 04/22/2013  . S/P hysterectomy 04/22/2013  . Palpitations   . Hypertension   . Hyperlipidemia   . Obesity     History obtained from: chart review and patient.  Madison Delgado is a 59 y.o. female presenting for a follow up visit.  She was last seen in December 2020.  At that time, she had positives to dog and Johnson grass.  She also has a history of sensitizations to trees, weeds, indoor and outdoor molds, and cat.  We started her on a prednisone pack and budesonide nasal rinses.  We did  discuss allergen immunotherapy as a means of long-term control.  She has a history of anaphylaxis to pork as well and we sent in a prescription for Auvi-Q.  Since the last visit, she has mostly done well. But she is still having some tinnitus. She did go to see Dr. Lucia Gaskins and this evaluation was normal. She is reporting pressure on the left side occasionally as well on the left side.  This seems to be her main concern today.  She does endorse some postnasal drip, but other than not the pressure in the pain or her main complaints.  She has minimal sneezing.  She does clear her throat occasionally.  She is wondering whether allergy shots are the way to go for her symptoms.  She is open to additional evaluations.  She would prefer not to go through the [place] role of doing allergy shots, especially if I do not feel that they are going to help much with the tinnitus.  She does report some balance issues as well.  Regarding her pork allergy, she continues to avoid it.  There have been no accidental exposures.  She believes her Auvi-Q was up-to-date.  Otherwise, there have been no changes to her past medical history, surgical history, family history, or social history.    Review of Systems  Constitutional: Negative.  Negative for chills, fever, malaise/fatigue and weight loss.  HENT: Positive for congestion and sinus pain. Negative for ear discharge and ear pain.  Positive for tinnitus.  Eyes: Negative for pain, discharge and redness.  Respiratory: Negative for cough, sputum production, shortness of breath and wheezing.   Cardiovascular: Negative.  Negative for chest pain and palpitations.  Gastrointestinal: Negative for abdominal pain, heartburn, nausea and vomiting.  Skin: Negative.  Negative for itching and rash.  Neurological: Negative for dizziness and headaches.  Endo/Heme/Allergies: Negative for environmental allergies. Does not bruise/bleed easily.       Objective:   Blood  pressure (!) 154/92, pulse 68, temperature 97.9 F (36.6 C), temperature source Temporal, resp. rate 16, height 5\' 4"  (1.626 m), weight 202 lb 3.2 oz (91.7 kg), SpO2 100 %. Body mass index is 34.71 kg/m.   Physical Exam:  Physical Exam  Constitutional: She appears well-developed.  Talkative, pleasant female.  HENT:  Head: Normocephalic and atraumatic.  Right Ear: Tympanic membrane, external ear and ear canal normal.  Left Ear: Tympanic membrane, external ear and ear canal normal.  Nose: Mucosal edema and rhinorrhea present. No nasal deformity or septal deviation. No epistaxis. Right sinus exhibits no maxillary sinus tenderness and no frontal sinus tenderness. Left sinus exhibits no maxillary sinus tenderness and no frontal sinus tenderness.  Mouth/Throat: Uvula is midline and oropharynx is clear and moist. Mucous membranes are not pale and not dry.  Tonsillar hypertrophy noted.   Eyes: Pupils are equal, round, and reactive to light. Conjunctivae and EOM are normal. Right eye exhibits no chemosis and no discharge. Left eye exhibits no chemosis and no discharge. Right conjunctiva is not injected. Left conjunctiva is not injected.  Cardiovascular: Normal rate, regular rhythm and normal heart sounds.  Respiratory: Effort normal and breath sounds normal. No accessory muscle usage. No tachypnea. No respiratory distress. She has no wheezes. She has no rhonchi. She has no rales. She exhibits no tenderness.  Moving air well in all lung fields.   Lymphadenopathy:    She has no cervical adenopathy.  Neurological: She is alert.  Skin: No abrasion, no petechiae and no rash noted. Rash is not papular, not vesicular and not urticarial. No erythema. No pallor.  Psychiatric: She has a normal mood and affect.     Diagnostic studies: none       Salvatore Marvel, MD  [facility] [place] [facility]

## 2019-05-19 NOTE — Patient Instructions (Addendum)
1. Seasonal and perennial allergic rhinitis (trees, weeds, grasses, indoor molds, outdoor molds and cat) - I am not convinced that allergy shots are going to provide the best relief. - If it was related to allergies, I think the prednisone and the nasal steroid would have relieved the symptoms.  - I am going to refer you to Dr. Deeann Saint office (he has someone that works with him who deals specifically with balance/tinnitus that could be helpful.   2. 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   3. Return in about 3 months (around 08/17/2019). This can be an in-person, a virtual Webex or a telephone follow up visit.   Please inform us of any Emergency Department visits, hospitalizations, or changes in symptoms. Call us before going to the ED for breathing or allergy symptoms since we might be able to fit you in for a sick visit. Feel free to contact us anytime with any questions, problems, or concerns.  It was a pleasure to see you again today!  Websites that have reliable patient information: 1. American Academy of Asthma, Allergy, and Immunology: www.aaaai.org 2. Food Allergy Research and Education (FARE): foodallergy.org 3. Mothers of Asthmatics: http://www.asthmacommunitynetwork.org 4. American College of Allergy, Asthma, and Immunology: www.acaai.org  "Like" [name] on Facebook and Instagram for our latest updates!        Make sure you are registered to vote! If you have moved or changed any of your contact information, you will need to get this updated before voting!  In some cases, you MAY be able to register to vote online: [email or web address]

## 2019-05-20 ENCOUNTER — Encounter: Payer: Self-pay | Admitting: Allergy & Immunology

## 2019-05-20 ENCOUNTER — Telehealth: Payer: Self-pay | Admitting: Allergy & Immunology

## 2019-05-20 DIAGNOSIS — Z049 Encounter for examination and observation for unspecified reason: Secondary | ICD-10-CM | POA: Diagnosis not present

## 2019-05-20 DIAGNOSIS — R519 Headache, unspecified: Secondary | ICD-10-CM | POA: Diagnosis not present

## 2019-05-20 DIAGNOSIS — G43719 Chronic migraine without aura, intractable, without status migrainosus: Secondary | ICD-10-CM | POA: Diagnosis not present

## 2019-05-20 DIAGNOSIS — G43839 Menstrual migraine, intractable, without status migrainosus: Secondary | ICD-10-CM | POA: Diagnosis not present

## 2019-05-20 DIAGNOSIS — Z79899 Other long term (current) drug therapy: Secondary | ICD-10-CM | POA: Diagnosis not present

## 2019-05-20 NOTE — Telephone Encounter (Signed)
Referral was created in Epic by Dr. Ernst Bowler.  I printed referral and all corresponding notes and records to Dr. Deeann Saint office in [place].  They will reach out to the patient to schedule.  I will follow up with their office on Friday the 8th.

## 2019-05-25 ENCOUNTER — Telehealth: Payer: Self-pay

## 2019-05-25 NOTE — Telephone Encounter (Signed)
-----   Message from Valentina Shaggy, MD sent at 05/20/2019  6:47 AM EST ----- Referral placed for Dr. Deeann Saint balance/tinnitus clinic specifically.

## 2019-05-25 NOTE — Telephone Encounter (Signed)
I have placed a referral to Dr Benj[facility]. [phone] Fax [phone] [address] [place], [place], [place] [address]  Their office will give the patient a call. I left a detailed voicemail for the patient.

## 2019-06-01 DIAGNOSIS — L280 Lichen simplex chronicus: Secondary | ICD-10-CM | POA: Diagnosis not present

## 2019-06-01 DIAGNOSIS — R202 Paresthesia of skin: Secondary | ICD-10-CM | POA: Diagnosis not present

## 2019-06-02 NOTE — Telephone Encounter (Signed)
Patient scheduled for 2/9 @ 4

## 2019-06-10 DIAGNOSIS — H43811 Vitreous degeneration, right eye: Secondary | ICD-10-CM | POA: Diagnosis not present

## 2019-06-10 DIAGNOSIS — H2512 Age-related nuclear cataract, left eye: Secondary | ICD-10-CM | POA: Diagnosis not present

## 2019-06-10 DIAGNOSIS — H35372 Puckering of macula, left eye: Secondary | ICD-10-CM | POA: Diagnosis not present

## 2019-06-10 DIAGNOSIS — Z961 Presence of intraocular lens: Secondary | ICD-10-CM | POA: Diagnosis not present

## 2019-06-15 NOTE — Progress Notes (Deleted)
Cardiology Office Note   Date:  06/15/2019   ID:  Madison Delgado, DOB November 08, 1960, MRN [id number]  PCP:  Madison Low, MD    No chief complaint on file.  Palpitations/sinus tach  Wt Readings from Last 3 Encounters:  05/19/19 202 lb 3.2 oz (91.7 kg)  04/21/19 203 lb 3.2 oz (92.2 kg)  05/27/18 197 lb (89.4 kg)       History of Present Illness: Madison Delgado is a 59 y.o. female  with history of inappropriate sinus tachycardia diagnosed at [facility] treated with beta-blocker,normal LV function on 2D echo 2013, hypertension, HLD, obesity. Normal GXT 2017.  In 2018, she complained of "palpitations and heart racing if she gets stressed. She feels like she is going to die from the palpitations although she has been told by multiple physicians that she will not even after all these years. She was instructed to take atenolol 50 mg daily and a half extra half a tablet as needed. Magnesium has not helped in the past. Blood pressure was up that day he asked her to minimize salt and increase exercise. Could increase valsartan if needed."  In 2019,She was still having palpitations mostly controlled with atenolol.   "Echo showed: - Left ventricle: The cavity size was normal. Systolic function was normal. The estimated ejection fraction was in the range of 60% to 65%. Wall motion was normal; there were no regional wall motion abnormalities. Left ventricular diastolic function parameters were normal. - Aortic valve: There was no regurgitation. - Mitral valve: Transvalvular velocity was within the normal range. There was no evidence for stenosis. There was trivial regurgitation. - Right ventricle: The cavity size was normal. Wall thickness was normal. Systolic function was normal. - Atrial septum: No defect or patent foramen ovale was identified. - Tricuspid valve: There was trivial regurgitation. - Pulmonary arteries: Systolic pressure  was mildly increased. PA peak pressure: 41 mm Hg (S)."   Sx felt better over  vacation, when stress when was decreased.    Noted in the past: "She is teaching at [employer].  SHe is getting Masters in [employer].   Anxiety increased when she had to go back to work.  She is trying to avoid Xanax use.    She has had a sleep study recommended  In the past but she was too claustrophobic and did not have the test."    Past Medical History:  Diagnosis Date  . Fibroids 04/22/2013  . History of stress test    a. ETT 5/17: normal  . Hyperlipidemia   . Hypertension   . Obesity   . Palpitations   . Postmenopausal bleeding 04/22/2013  . S/P hysterectomy 04/22/2013  . Seasonal and perennial allergic rhinitis 03/18/2017    Past Surgical History:  Procedure Laterality Date  . ADENOIDECTOMY    . BILATERAL SALPINGECTOMY Bilateral 04/22/2013   Procedure: BILATERAL SALPINGECTOMY;  Surgeon: Madison Royals, MD;  Location: [facility];  Service: Gynecology;  Laterality: Bilateral;  . CARPAL TUNNEL RELEASE    . ROBOTIC ASSISTED TOTAL HYSTERECTOMY N/A 04/22/2013   Procedure: ROBOTIC ASSISTED TOTAL HYSTERECTOMY;  Surgeon: Madison Royals, MD;  Location: [facility];  Service: Gynecology;  Laterality: N/A;  3 1/2 hrs.  . TONSILLECTOMY    . WISDOM TOOTH EXTRACTION       Current Outpatient Medications  Medication Sig Dispense Refill  . ALPRAZolam (XANAX) 0.5 MG tablet Take 0.5 mg by mouth 2 (two) times daily as needed for anxiety  or sleep.     Marland Kitchen atenolol (TENORMIN) 50 MG tablet Take 1 tablet (50 mg total) by mouth daily. May take additional 25 mg (1/2 tablet) daily PRN for palpitations (Patient taking differently: Take 75 mg by mouth daily. May take additional 25 mg (1/2 tablet) daily PRN for palpitations) 90 tablet 3  . Cyanocobalamin (VITAMIN B12) 500 MCG TABS Take by mouth.    . fluticasone (FLONASE) 50 MCG/ACT nasal spray Place 2 sprays into both nostrils daily. 9.9 g 2  . Fluticasone Propionate  (XHANCE) 93 MCG/ACT EXHU Place 2 sprays into both nostrils 2 (two) times daily as needed. 32 mL 5  . gabapentin (NEURONTIN) 300 MG capsule Take 1 capsule (300 mg total) by mouth 3 (three) times daily. 90 capsule 3  . MAGNESIUM PO Take by mouth.    . Multiple Vitamin (MULTIVITAMIN) capsule Take 1 capsule by mouth daily.    Marland Kitchen olmesartan (BENICAR) 40 MG tablet Take 40 mg by mouth daily.      No current facility-administered medications for this visit.    Allergies:   Pork allergy    Social History:  The patient  reports that she has quit smoking. She has never used smokeless tobacco. She reports that she does not drink alcohol or use drugs.   Family History:  The patient's ***family history includes Asthma in her sister; Coronary artery disease in her father; Diabetes in her mother; Heart attack in her father; Hypertension in her mother and sister; Stroke in her mother.    ROS:  Please see the history of present illness.   Otherwise, review of systems are positive for ***.   All other systems are reviewed and negative.    PHYSICAL EXAM: VS:  There were no vitals taken for this visit. , BMI There is no height or weight on file to calculate BMI. GEN: Well nourished, well developed, in no acute distress  HEENT: normal  Neck: no JVD, carotid bruits, or masses Cardiac: ***RRR; no murmurs, rubs, or gallops,no edema  Respiratory:  clear to auscultation bilaterally, normal work of breathing GI: soft, nontender, nondistended, + BS MS: no deformity or atrophy  Skin: warm and dry, no rash Neuro:  Strength and sensation are intact Psych: euthymic mood, full affect   EKG:   The ekg ordered today demonstrates ***   Recent Labs: No results found for requested labs within last 8760 hours.   Lipid Panel No results found for: CHOL, TRIG, HDL, CHOLHDL, VLDL, LDLCALC, LDLDIRECT   Other studies Reviewed: Additional studies/ records that were reviewed today with results demonstrating:  ***.   ASSESSMENT AND PLAN:  1. Palpitations: sinus tach in the past.  2. HTN: 3. Leg swelling: 4. Concern of OSA as cause of mild pulm HTN.   Current medicines are reviewed at length with the patient today.  The patient concerns regarding her medicines were addressed.  The following changes have been made:  No change***  Labs/ tests ordered today include: *** No orders of the defined types were placed in this encounter.   Recommend 150 minutes/week of aerobic exercise Delgado fat, Delgado carb, high fiber diet recommended  Disposition:   FU in ***   Signed, Larae Grooms, MD  06/15/2019 12:33 PM    [facility] [place], [place], [place]  [address] Phone: [phone]; Fax: [phone]

## 2019-06-16 ENCOUNTER — Ambulatory Visit: Payer: Managed Care, Other (non HMO) | Admitting: Interventional Cardiology

## 2019-07-06 NOTE — Progress Notes (Signed)
Cardiology Office Note   Date:  07/08/2019   ID:  Madison Delgado, DOB [birth year], MRN [id number]  PCP:  Wenda Low, MD    No chief complaint on file.  tachycardia  Wt Readings from Last 3 Encounters:  07/08/19 198 lb 6.4 oz (90 kg)  05/19/19 202 lb 3.2 oz (91.7 kg)  04/21/19 203 lb 3.2 oz (92.2 kg)       History of Present Illness: Madison Delgado is a 59 y.o. female   with history of inappropriate sinus tachycardia diagnosed at [facility] treated with beta-blocker,normal LV function on 2D echo 2013, hypertension, HLD, obesity. Normal GXT 2017.  Palpitations have been worse with stress in the past.  She has tried magnesium without any relief.  LVEF normal.  Minimally elevated PA pressure in December 2019.  Anxiety increased when she had to go back to work.  She is trying to avoid Xanax use.    She has had a sleep study recommended  In the past but she was too claustrophobic and did not have the test.  Graduated Masters program in Dec 2020.   Since the last visit, palpitations have not been too bothersome.  She has not had any prolonged episodes, and less frequent in general.  Not affected her daily activities.   Walks regularly with dog and feels well.  Mild hip pain with walking.    Past Medical History:  Diagnosis Date  . Fibroids 04/22/2013  . History of stress test    a. ETT 5/17: normal  . Hyperlipidemia   . Hypertension   . Obesity   . Palpitations   . Postmenopausal bleeding 04/22/2013  . S/P hysterectomy 04/22/2013  . Seasonal and perennial allergic rhinitis 03/18/2017    Past Surgical History:  Procedure Laterality Date  . ADENOIDECTOMY    . BILATERAL SALPINGECTOMY Bilateral 04/22/2013   Procedure: BILATERAL SALPINGECTOMY;  Surgeon: Elveria Royals, MD;  Location: [facility];  Service: Gynecology;  Laterality: Bilateral;  . CARPAL TUNNEL RELEASE    . ROBOTIC ASSISTED TOTAL HYSTERECTOMY N/A 04/22/2013   Procedure:  ROBOTIC ASSISTED TOTAL HYSTERECTOMY;  Surgeon: Elveria Royals, MD;  Location: [facility];  Service: Gynecology;  Laterality: N/A;  3 1/2 hrs.  . TONSILLECTOMY    . WISDOM TOOTH EXTRACTION       Current Outpatient Medications  Medication Sig Dispense Refill  . ALPRAZolam (XANAX) 0.5 MG tablet Take 0.5 mg by mouth 2 (two) times daily as needed for anxiety or sleep.     Marland Kitchen atenolol (TENORMIN) 50 MG tablet Take 1 tablet (50 mg total) by mouth daily. May take additional 25 mg (1/2 tablet) daily PRN for palpitations 90 tablet 3  . Cyanocobalamin (VITAMIN B12) 500 MCG TABS Take by mouth.    . fluticasone (FLONASE) 50 MCG/ACT nasal spray Place 2 sprays into both nostrils daily. 9.9 g 2  . Fluticasone Propionate (XHANCE) 93 MCG/ACT EXHU Place 2 sprays into both nostrils 2 (two) times daily as needed. 32 mL 5  . gabapentin (NEURONTIN) 300 MG capsule Take 1 capsule (300 mg total) by mouth 3 (three) times daily. 90 capsule 3  . MAGNESIUM PO Take by mouth.    . Multiple Vitamin (MULTIVITAMIN) capsule Take 1 capsule by mouth daily.    Marland Kitchen olmesartan (BENICAR) 40 MG tablet Take 40 mg by mouth daily.      No current facility-administered medications for this visit.    Allergies:   Pork allergy  Social History:  The patient  reports that she has quit smoking. She has never used smokeless tobacco. She reports that she does not drink alcohol or use drugs.   Family History:  The patient's family history includes Asthma in her sister; Coronary artery disease in her father; Diabetes in her mother; Heart attack in her father; Hypertension in her mother and sister; Stroke in her mother.    ROS:  Please see the history of present illness.   Otherwise, review of systems are positive for mild palpitations.   All other systems are reviewed and negative.    PHYSICAL EXAM: VS:  BP (!) 144/82   Pulse 75   Ht 5\' 4"  (1.626 m)   Wt 198 lb 6.4 oz (90 kg)   SpO2 99%   BMI 34.06 kg/m  , BMI Body mass index is 34.06  kg/m. GEN: Well nourished, well developed, in no acute distress  HEENT: normal  Neck: no JVD, carotid bruits, or masses Cardiac: RRR; no murmurs, rubs, or gallops,no edema  Respiratory:  clear to auscultation bilaterally, normal work of breathing GI: soft, nontender, nondistended, + BS MS: no deformity or atrophy  Skin: warm and dry, no rash Neuro:  Strength and sensation are intact Psych: euthymic mood, full affect   EKG:   The ekg ordered today demonstrates NSR, no ST changes   Recent Labs: No results found for requested labs within last 8760 hours.   Lipid Panel No results found for: CHOL, TRIG, HDL, CHOLHDL, VLDL, LDLCALC, LDLDIRECT   Other studies Reviewed: Additional studies/ records that were reviewed today with results demonstrating: 12/2018 labs were reviewed, lipids and electrolytes well controlled.   ASSESSMENT AND PLAN:  1. Palpitations: Supraventricular in origin.  Occurs with stress.  Symptoms have been managed with beta-blocker.  Well controlled of late.  TOlerating walking for exercising. 2. Hypertension: BP at home has been well controlled. Readings less than 120/80 at home. Continue current meds. 3. Leg swelling: Resolved. 4. She has had symptoms of sleep apnea in the past including snoring, fatigue and daytime somnolence.  She has had observed apnea.  We have tried to coordinate sleep study. 5. Cutting back on processed and sugars in general.   Current medicines are reviewed at length with the patient today.  The patient concerns regarding her medicines were addressed.  The following changes have been made:  No change  Labs/ tests ordered today include:  No orders of the defined types were placed in this encounter.   Recommend 150 minutes/week of aerobic exercise Low fat, low carb, high fiber diet recommended  Disposition:   FU in 1 year   Signed, Larae Grooms, MD  07/08/2019 9:39 AM    [facility] [place],  [place], [place]  [address] Phone: [phone]; Fax: [phone]

## 2019-07-08 ENCOUNTER — Other Ambulatory Visit: Payer: Self-pay

## 2019-07-08 ENCOUNTER — Ambulatory Visit: Payer: BC Managed Care – PPO | Admitting: Interventional Cardiology

## 2019-07-08 ENCOUNTER — Encounter: Payer: Self-pay | Admitting: Interventional Cardiology

## 2019-07-08 VITALS — BP 144/82 | HR 75 | Ht 64.0 in | Wt 198.4 lb

## 2019-07-08 DIAGNOSIS — I10 Essential (primary) hypertension: Secondary | ICD-10-CM

## 2019-07-08 DIAGNOSIS — R000 Tachycardia, unspecified: Secondary | ICD-10-CM | POA: Diagnosis not present

## 2019-07-08 DIAGNOSIS — E782 Mixed hyperlipidemia: Secondary | ICD-10-CM

## 2019-07-08 DIAGNOSIS — R002 Palpitations: Secondary | ICD-10-CM

## 2019-07-08 MED ORDER — ATENOLOL 50 MG PO TABS: 50.0000 mg | ORAL_TABLET | Freq: Every day | ORAL | 3 refills | Status: DC

## 2019-07-08 MED ORDER — OLMESARTAN MEDOXOMIL 40 MG PO TABS: 40.0000 mg | ORAL_TABLET | Freq: Every day | ORAL | 3 refills | Status: DC

## 2019-07-08 NOTE — Patient Instructions (Signed)

## 2019-07-13 DIAGNOSIS — M67432 Ganglion, left wrist: Secondary | ICD-10-CM | POA: Diagnosis not present

## 2019-07-13 DIAGNOSIS — J309 Allergic rhinitis, unspecified: Secondary | ICD-10-CM | POA: Diagnosis not present

## 2019-07-13 DIAGNOSIS — I10 Essential (primary) hypertension: Secondary | ICD-10-CM | POA: Diagnosis not present

## 2019-07-13 DIAGNOSIS — R7303 Prediabetes: Secondary | ICD-10-CM | POA: Diagnosis not present

## 2019-07-21 DIAGNOSIS — H903 Sensorineural hearing loss, bilateral: Secondary | ICD-10-CM | POA: Diagnosis not present

## 2019-07-21 DIAGNOSIS — R42 Dizziness and giddiness: Secondary | ICD-10-CM | POA: Diagnosis not present

## 2019-07-21 DIAGNOSIS — J343 Hypertrophy of nasal turbinates: Secondary | ICD-10-CM | POA: Diagnosis not present

## 2019-07-21 DIAGNOSIS — J342 Deviated nasal septum: Secondary | ICD-10-CM | POA: Diagnosis not present

## 2019-07-21 DIAGNOSIS — J310 Chronic rhinitis: Secondary | ICD-10-CM | POA: Diagnosis not present

## 2019-07-21 DIAGNOSIS — H9312 Tinnitus, left ear: Secondary | ICD-10-CM | POA: Diagnosis not present

## 2019-07-22 DIAGNOSIS — M25532 Pain in left wrist: Secondary | ICD-10-CM | POA: Diagnosis not present

## 2019-07-22 DIAGNOSIS — M67834 Other specified disorders of tendon, left wrist: Secondary | ICD-10-CM | POA: Diagnosis not present

## 2019-07-30 ENCOUNTER — Ambulatory Visit: Payer: BC Managed Care – PPO | Attending: Family

## 2019-07-30 DIAGNOSIS — Z23 Encounter for immunization: Secondary | ICD-10-CM

## 2019-07-30 NOTE — Progress Notes (Signed)
   Covid-19 Vaccination Clinic  Name:  Madison Delgado    MRN: [id number] DOB: Nov 27, 1960  07/30/2019  [name] was observed post Covid-19 immunization for 15 minutes without incident. She was provided with Vaccine Information Sheet and instruction to access the V-Safe system.   [name] was instructed to call 911 with any severe reactions post vaccine: Marland Kitchen Difficulty breathing  . Swelling of face and throat  . A fast heartbeat  . A bad rash all over body  . Dizziness and weakness   Immunizations Administered    Name Date Dose VIS Date Route   Moderna COVID-19 Vaccine 07/30/2019  3:21 PM 0.5 mL 04/14/2019 Intramuscular   Manufacturer: Moderna   Lot: [id number]   [id number]

## 2019-08-12 DIAGNOSIS — Z789 Other specified health status: Secondary | ICD-10-CM | POA: Diagnosis not present

## 2019-09-01 ENCOUNTER — Ambulatory Visit: Payer: BC Managed Care – PPO | Attending: Family

## 2019-09-01 DIAGNOSIS — Z23 Encounter for immunization: Secondary | ICD-10-CM

## 2019-09-01 NOTE — Progress Notes (Signed)
   Covid-19 Vaccination Clinic  Name:  Madison Delgado    MRN: [id number] DOB: 20-Jan-1961  09/01/2019  Madison Delgado was observed post Covid-19 immunization for 15 minutes without incident. She was provided with Vaccine Information Sheet and instruction to access the V-Safe system.   Madison Delgado was instructed to call 911 with any severe reactions post vaccine: Marland Kitchen Difficulty breathing  . Swelling of face and throat  . A fast heartbeat  . A bad rash all over body  . Dizziness and weakness   Immunizations Administered    Name Date Dose VIS Date Route   Moderna COVID-19 Vaccine 09/01/2019  2:57 PM 0.5 mL 04/2019 Intramuscular   Manufacturer: Moderna   Lot: [id number]   [id number]

## 2019-11-18 ENCOUNTER — Ambulatory Visit: Payer: BC Managed Care – PPO | Admitting: Podiatry

## 2019-11-23 ENCOUNTER — Ambulatory Visit: Payer: BC Managed Care – PPO | Admitting: Podiatry

## 2019-12-31 ENCOUNTER — Telehealth: Payer: Self-pay | Admitting: Interventional Cardiology

## 2019-12-31 NOTE — Telephone Encounter (Signed)
Called and spoke to patient. She states that she has discomfort underneath her breast and in her abdomen when she bends over. She denies chest pain, SOB, palpitations, or any other Sx. She states that it does not happen every time she bends over, but only happens when she bends over. She states that she experienced this back in 2017 and her stress test was normal. Patient has an upcoming appointment with PCP. Instructed patient to follow up with PCP and let us know if her Sx changed. She verbalized understanding and thanked me for the call.

## 2019-12-31 NOTE — Telephone Encounter (Signed)
Patient states she is experiencing pain underneath her breast when she bends over. She states it doesn't happen every time she bends over, but the pain is occurring often enough to concern her because she had the same symptom in 2017. She is requesting a call back to discuss.

## 2020-01-14 ENCOUNTER — Other Ambulatory Visit: Payer: Self-pay

## 2020-01-14 ENCOUNTER — Encounter: Payer: Self-pay | Admitting: Podiatry

## 2020-01-14 ENCOUNTER — Other Ambulatory Visit: Payer: Self-pay | Admitting: Podiatry

## 2020-01-14 ENCOUNTER — Ambulatory Visit (INDEPENDENT_AMBULATORY_CARE_PROVIDER_SITE_OTHER): Payer: BC Managed Care – PPO

## 2020-01-14 ENCOUNTER — Ambulatory Visit: Payer: BC Managed Care – PPO | Admitting: Podiatry

## 2020-01-14 VITALS — Temp 97.0°F

## 2020-01-14 DIAGNOSIS — M778 Other enthesopathies, not elsewhere classified: Secondary | ICD-10-CM

## 2020-01-14 DIAGNOSIS — M779 Enthesopathy, unspecified: Secondary | ICD-10-CM

## 2020-01-14 DIAGNOSIS — M79672 Pain in left foot: Secondary | ICD-10-CM

## 2020-01-19 NOTE — Progress Notes (Signed)
Subjective:   Patient ID: Madison Delgado, female   DOB: 59 y.o.   MRN: [id number]   HPI Patient presents stating that the left toes just do not feel like it down like he used to she does not have the intense pain she had previously she still feels like a lot of pressure and pain is present in her forefoot   ROS      Objective:  Physical Exam  Neurovascular status intact negative Bevelyn Buckles' sign noted with the patient's third MPJ left found to be inflamed with fluid buildup and there continues to be stiffness in the joint and what appears to be some natural plantar flexion of the bone structure     Assessment:  Chronic inflammatory capsulitis third MPJ left     Plan:  H&P condition reviewed.  Today I went ahead and I did do 60 mg like Marcaine mixture sterile prep to the area aspirated the joint to make sure fluid was clear and injected quarter cc dexamethasone Kenalog and I then went ahead and I am scheduling to see ped orthotist for orthotics were recommended the third metatarsal phalangeal joint be offloaded left along with dispersion padding.  Patient probably would do best in a slightly softer device due to her activity levels and shoe gear usage

## 2020-01-27 ENCOUNTER — Other Ambulatory Visit: Payer: Self-pay | Admitting: Internal Medicine

## 2020-01-27 DIAGNOSIS — Z1231 Encounter for screening mammogram for malignant neoplasm of breast: Secondary | ICD-10-CM

## 2020-01-31 ENCOUNTER — Other Ambulatory Visit: Payer: Self-pay

## 2020-01-31 ENCOUNTER — Encounter (HOSPITAL_COMMUNITY): Payer: Self-pay

## 2020-01-31 ENCOUNTER — Ambulatory Visit (HOSPITAL_COMMUNITY)
Admission: EM | Admit: 2020-01-31 | Discharge: 2020-01-31 | Disposition: A | Discharge status: Home / Self Care | Payer: BC Managed Care – PPO | Attending: Emergency Medicine | Admitting: Emergency Medicine

## 2020-01-31 DIAGNOSIS — M25511 Pain in right shoulder: Secondary | ICD-10-CM | POA: Diagnosis not present

## 2020-01-31 DIAGNOSIS — M546 Pain in thoracic spine: Secondary | ICD-10-CM | POA: Diagnosis not present

## 2020-01-31 MED ORDER — KETOROLAC TROMETHAMINE 30 MG/ML IJ SOLN
30.0000 mg | Freq: Once | INTRAMUSCULAR | Status: AC
  Administered 2020-01-31: 30 mg via INTRAMUSCULAR

## 2020-01-31 MED ORDER — TIZANIDINE HCL 4 MG PO TABS: 4.0000 mg | ORAL_TABLET | Freq: Four times a day (QID) | ORAL | 0 refills | Status: DC | PRN

## 2020-01-31 MED ORDER — KETOROLAC TROMETHAMINE 30 MG/ML IJ SOLN
INTRAMUSCULAR | Status: AC
  Filled 2020-01-31: qty 1

## 2020-01-31 MED ORDER — NAPROXEN 500 MG PO TABS: 500.0000 mg | ORAL_TABLET | Freq: Two times a day (BID) | ORAL | 0 refills | Status: DC

## 2020-01-31 NOTE — Discharge Instructions (Addendum)
We gave you an injection of Toradol Begin Naprosyn in the morning to help with swelling pain and inflammation May use tizanidine (muscle relaxer) as needed to supplement with Naprosyn, may take tonight and continue as needed, limit use to at home at bedtime as this may cause some drowsiness Gentle stretching Alternate ice and heat Follow-up with sports medicine if not improving or worsening

## 2020-01-31 NOTE — ED Triage Notes (Signed)
Pt present right side shoulder and back pain. Symptoms started on Thursday. Pt tried otc medication with no relief.

## 2020-02-01 NOTE — ED Provider Notes (Signed)
[place]    CSN: [id number] Arrival date & time: 01/31/20  1605      History   Chief Complaint Chief Complaint  Patient presents with  . Back Pain  . Shoulder Pain    HPI Madison Delgado is a 59 y.o. female presenting today for evaluation of right shoulder and back pain.  Patient reports on Thursday began to develop discomfort in her shoulders and since has developed increased pain in her back and neck area.  Denies any specific injury trauma or fall.  She does report increased activity doing yard work and painting this week that may have triggered symptoms.  Denies paresthesias.  Taking ibuprofen without relief.  Reports joint swelling with prednisone use in the past.  HPI  Past Medical History:  Diagnosis Date  . Fibroids 04/22/2013  . History of stress test    a. ETT 5/17: normal  . Hyperlipidemia   . Hypertension   . Obesity   . Palpitations   . Postmenopausal bleeding 04/22/2013  . S/P hysterectomy 04/22/2013  . Seasonal and perennial allergic rhinitis 03/18/2017    Patient Active Problem List   Diagnosis Date Noted  . 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 Inappropriate sinus tachycardia 12/24/2017  . Seasonal and perennial allergic rhinitis 03/18/2017  . Postmenopausal bleeding 04/22/2013  . Fibroids 04/22/2013  . S/P hysterectomy 04/22/2013  . Palpitations   . Hypertension   . Hyperlipidemia   . Obesity     Past Surgical History:  Procedure Laterality Date  . ADENOIDECTOMY    . BILATERAL SALPINGECTOMY Bilateral 04/22/2013   Procedure: BILATERAL SALPINGECTOMY;  Surgeon: Elveria Royals, MD;  Location: [facility];  Service: Gynecology;  Laterality: Bilateral;  . CARPAL TUNNEL RELEASE    . ROBOTIC ASSISTED TOTAL HYSTERECTOMY N/A 04/22/2013   Procedure: ROBOTIC ASSISTED TOTAL HYSTERECTOMY;  Surgeon: Elveria Royals, MD;  Location: [facility];  Service: Gynecology;  Laterality: N/A;  3 1/2  hrs.  . TONSILLECTOMY    . WISDOM TOOTH EXTRACTION      OB History   No obstetric history on file.      Home Medications    Prior to Admission medications   Medication Sig Start Date End Date Taking? Authorizing Provider  ALPRAZolam Duanne Moron) 0.5 MG tablet Take 0.5 mg by mouth 2 (two) times daily as needed for anxiety or sleep.     [provider]  atenolol (TENORMIN) 50 MG tablet Take 1 tablet (50 mg total) by mouth daily. May take additional 25 mg (1/2 tablet) daily PRN for palpitations 07/08/19   Jettie Booze, MD  Cyanocobalamin (VITAMIN B12) 500 MCG TABS Take by mouth.    [provider]  fluticasone (FLONASE) 50 MCG/ACT nasal spray Place 2 sprays into both nostrils daily. 02/26/16   Hedges, Dellis Filbert, PA-C  Fluticasone Propionate (XHANCE) 93 MCG/ACT EXHU Place 2 sprays into both nostrils 2 (two) times daily as needed. 03/13/17   Valentina Shaggy, MD  gabapentin (NEURONTIN) 300 MG capsule Take 1 capsule (300 mg total) by mouth 3 (three) times daily. 05/13/19   Wallene Huh, DPM  MAGNESIUM PO Take by mouth.    [provider]  Multiple Vitamin (MULTIVITAMIN) capsule Take 1 capsule by mouth daily.    [provider]  naproxen (NAPROSYN) 500 MG tablet Take 1 tablet (500 mg total) by mouth 2 (two) times daily. 01/31/20   Wenceslao Loper C, PA-C  olmesartan (  BENICAR) 40 MG tablet Take 1 tablet (40 mg total) by mouth daily. 07/08/19   Jettie Booze, MD  tiZANidine (ZANAFLEX) 4 MG tablet Take 1 tablet (4 mg total) by mouth every 6 (six) hours as needed for muscle spasms. 01/31/20   Cadyn Rodger, Elesa Hacker, PA-C    Family History Family History  Problem Relation Age of Onset  . Diabetes Mother   . Hypertension Mother   . Stroke Mother   . Coronary artery disease Father   . Heart attack Father   . Hypertension Sister   . Asthma Sister   . Breast cancer Neg Hx   . Allergic rhinitis Neg Hx   . Angioedema Neg Hx   . Atopy Neg Hx   .  Eczema Neg Hx   . Immunodeficiency Neg Hx   . Urticaria Neg Hx     Social History Social History   Tobacco Use  . Smoking status: Former Research scientist (life sciences)  . Smokeless tobacco: Never Used  Vaping Use  . Vaping Use: Never used  Substance Use Topics  . Alcohol use: No  . Drug use: No     Allergies   Pork allergy   Review of Systems Review of Systems  Constitutional: Negative for fatigue and fever.  Eyes: Negative for visual disturbance.  Respiratory: Negative for shortness of breath.   Cardiovascular: Negative for chest pain.  Gastrointestinal: Negative for abdominal pain, nausea and vomiting.  Musculoskeletal: Positive for back pain and myalgias. Negative for arthralgias and joint swelling.  Skin: Negative for color change, rash and wound.  Neurological: Negative for dizziness, weakness, light-headedness and headaches.     Physical Exam Triage Vital Signs ED Triage Vitals  Enc Vitals Group     BP 01/31/20 1643 (!) 161/90     Pulse Rate 01/31/20 1643 70     Resp 01/31/20 1643 18     Temp 01/31/20 1643 98.3 F (36.8 C)     Temp Source 01/31/20 1643 Oral     SpO2 01/31/20 1643 100 %     Weight --      Height --      Head Circumference --      Peak Flow --      Pain Score 01/31/20 1642 7     Pain Loc --      Pain Edu? --      Excl. in [place]? --    No data found.  Updated Vital Signs BP (!) 161/90 (BP Location: Right Arm)   Pulse 70   Temp 98.3 F (36.8 C) (Oral)   Resp 18   SpO2 100%   Visual Acuity Right Eye Distance:   Left Eye Distance:   Bilateral Distance:    Right Eye Near:   Left Eye Near:    Bilateral Near:     Physical Exam Vitals and nursing note reviewed.  Constitutional:      Appearance: She is well-developed.     Comments: No acute distress  HENT:     Head: Normocephalic and atraumatic.     Nose: Nose normal.  Eyes:     Conjunctiva/sclera: Conjunctivae normal.  Cardiovascular:     Rate and Rhythm: Normal rate and regular rhythm.    Pulmonary:     Effort: Pulmonary effort is normal. No respiratory distress.     Comments: Breathing comfortably at rest, CTABL, no wheezing, rales or other adventitious sounds auscultated  Abdominal:     General: There is no distension.  Musculoskeletal:  General: Normal range of motion.     Cervical back: Neck supple.     Comments: Nontender palpation of cervical, thoracic and lumbar spine midline, tenderness to palpation to mid thoracic paraspinal musculature just medial to periscapular area Full active range of motion of neck Full active range of motion of upper extremities, strength at shoulders 5/5 and equal bilaterally, grip strength 5/5 and equal bilaterally Nontender along clavicle, AC joint or scapular spine Radial pulse 2+  Skin:    General: Skin is warm and dry.  Neurological:     Mental Status: She is alert and oriented to person, place, and time.      UC Treatments / Results  Labs (all labs ordered are listed, but only abnormal results are displayed) Labs Reviewed - No data to display  EKG   Radiology No results found.  Procedures Procedures (including critical care time)  Medications Ordered in UC Medications  ketorolac (TORADOL) 30 MG/ML injection 30 mg (30 mg Intramuscular Given 01/31/20 1738)    Initial Impression / Assessment and Plan / UC Course  I have reviewed the triage vital signs and the nursing notes.  Pertinent labs & imaging results that were available during my care of the patient were reviewed by me and considered in my medical decision making (see chart for details).     Thoracic back pain without mechanism of injury, increase in activity this week, suspect likely muscular strain, low suspicion of acute bony abnormality.  Recommending continued anti-inflammatories and muscle relaxers.  She is starting a new job tomorrow, providing Toradol IM prior to discharge.  Deferring steroids given adverse effects with previous use.  Will try  alternative NSAID and provide Naprosyn.  Discussed activity modification.  Discussed strict return precautions. Patient verbalized understanding and is agreeable with plan.  Final Clinical Impressions(s) / UC Diagnoses   Final diagnoses:  Acute right-sided thoracic back pain  Acute pain of right shoulder     Discharge Instructions     We gave you an injection of Toradol Begin Naprosyn in the morning to help with swelling pain and inflammation May use tizanidine (muscle relaxer) as needed to supplement with Naprosyn, may take tonight and continue as needed, limit use to at home at bedtime as this may cause some drowsiness Gentle stretching Alternate ice and heat Follow-up with sports medicine if not improving or worsening   ED Prescriptions    Medication Sig Dispense Auth. Provider   naproxen (NAPROSYN) 500 MG tablet Take 1 tablet (500 mg total) by mouth 2 (two) times daily. 30 tablet Kamali Nephew C, PA-C   tiZANidine (ZANAFLEX) 4 MG tablet Take 1 tablet (4 mg total) by mouth every 6 (six) hours as needed for muscle spasms. 30 tablet Falyn Rubel, [place] C, PA-C     PDMP not reviewed this encounter.   Janith Lima, [place] 02/01/20 1810

## 2020-02-05 DIAGNOSIS — M542 Cervicalgia: Secondary | ICD-10-CM | POA: Diagnosis not present

## 2020-02-05 DIAGNOSIS — M25511 Pain in right shoulder: Secondary | ICD-10-CM | POA: Diagnosis not present

## 2020-02-08 ENCOUNTER — Other Ambulatory Visit: Payer: Self-pay

## 2020-02-08 ENCOUNTER — Ambulatory Visit (INDEPENDENT_AMBULATORY_CARE_PROVIDER_SITE_OTHER): Payer: BC Managed Care – PPO | Admitting: Orthotics

## 2020-02-08 DIAGNOSIS — M79672 Pain in left foot: Secondary | ICD-10-CM | POA: Diagnosis not present

## 2020-02-08 DIAGNOSIS — M779 Enthesopathy, unspecified: Secondary | ICD-10-CM | POA: Diagnosis not present

## 2020-02-18 ENCOUNTER — Ambulatory Visit: Payer: BC Managed Care – PPO

## 2020-02-24 ENCOUNTER — Ambulatory Visit
Admission: RE | Admit: 2020-02-24 | Discharge: 2020-02-24 | Disposition: A | Discharge status: Home / Self Care | Payer: BC Managed Care – PPO | Source: Ambulatory Visit | Attending: Internal Medicine | Admitting: Internal Medicine

## 2020-02-24 ENCOUNTER — Other Ambulatory Visit: Payer: Self-pay

## 2020-02-24 DIAGNOSIS — Z1231 Encounter for screening mammogram for malignant neoplasm of breast: Secondary | ICD-10-CM | POA: Diagnosis not present

## 2020-03-07 ENCOUNTER — Other Ambulatory Visit: Payer: Self-pay

## 2020-03-07 ENCOUNTER — Ambulatory Visit (INDEPENDENT_AMBULATORY_CARE_PROVIDER_SITE_OTHER): Payer: BC Managed Care – PPO | Admitting: Orthotics

## 2020-03-07 DIAGNOSIS — M79672 Pain in left foot: Secondary | ICD-10-CM | POA: Diagnosis not present

## 2020-03-07 DIAGNOSIS — M779 Enthesopathy, unspecified: Secondary | ICD-10-CM

## 2020-03-07 DIAGNOSIS — G629 Polyneuropathy, unspecified: Secondary | ICD-10-CM | POA: Diagnosis not present

## 2020-03-07 NOTE — Progress Notes (Signed)
Patient came in today to pick up custom made foot orthotics.  The goals were accomplished and the patient reported no dissatisfaction with said orthotics.  Patient was advised of breakin period and how to report any issues. 

## 2020-03-09 DIAGNOSIS — M542 Cervicalgia: Secondary | ICD-10-CM | POA: Diagnosis not present

## 2020-03-09 DIAGNOSIS — M25511 Pain in right shoulder: Secondary | ICD-10-CM | POA: Diagnosis not present

## 2020-03-28 ENCOUNTER — Emergency Department (HOSPITAL_COMMUNITY): Payer: BC Managed Care – PPO

## 2020-03-28 ENCOUNTER — Encounter (HOSPITAL_COMMUNITY): Payer: Self-pay | Admitting: Emergency Medicine

## 2020-03-28 ENCOUNTER — Emergency Department (HOSPITAL_COMMUNITY)
Admission: EM | Admit: 2020-03-28 | Discharge: 2020-03-28 | Disposition: A | Discharge status: Home / Self Care | Payer: BC Managed Care – PPO | Attending: Emergency Medicine | Admitting: Emergency Medicine

## 2020-03-28 DIAGNOSIS — F419 Anxiety disorder, unspecified: Secondary | ICD-10-CM | POA: Diagnosis not present

## 2020-03-28 DIAGNOSIS — Z87891 Personal history of nicotine dependence: Secondary | ICD-10-CM | POA: Insufficient documentation

## 2020-03-28 DIAGNOSIS — I10 Essential (primary) hypertension: Secondary | ICD-10-CM | POA: Diagnosis not present

## 2020-03-28 DIAGNOSIS — R002 Palpitations: Secondary | ICD-10-CM | POA: Insufficient documentation

## 2020-03-28 DIAGNOSIS — J90 Pleural effusion, not elsewhere classified: Secondary | ICD-10-CM | POA: Diagnosis not present

## 2020-03-28 LAB — BASIC METABOLIC PANEL
Anion gap: 11 (ref 5–15)
BUN: 14 mg/dL (ref 6–20)
CO2: 25 mmol/L (ref 22–32)
Calcium: 9.7 mg/dL (ref 8.9–10.3)
Chloride: 102 mmol/L (ref 98–111)
Creatinine, Ser: 0.96 mg/dL (ref 0.44–1.00)
GFR, Estimated: >60 mL/min (ref >60)
Glucose, Bld: 130 mg/dL — ABNORMAL HIGH (ref 70–99)
Potassium: 3.8 mmol/L (ref 3.5–5.1)
Sodium: 138 mmol/L (ref 135–145)

## 2020-03-28 LAB — CBC
HCT: 43.9 % (ref 36.0–46.0)
Hemoglobin: 13.5 g/dL (ref 12.0–15.0)
MCH: 27.2 pg (ref 26.0–34.0)
MCHC: 30.8 g/dL (ref 30.0–36.0)
MCV: 88.5 fL (ref 80.0–100.0)
Platelets: 311 10*3/uL (ref 150–400)
RBC: 4.96 MIL/uL (ref 3.87–5.11)
RDW: 15 % (ref 11.5–15.5)
WBC: 5 10*3/uL (ref 4.0–10.5)
nRBC: 0 % (ref 0.0–0.2)

## 2020-03-28 LAB — I-STAT BETA HCG BLOOD, ED (MC, WL, AP ONLY): I-stat hCG, quantitative: 5.7 m[IU]/mL — ABNORMAL HIGH (ref <5)

## 2020-03-28 MED ORDER — ALPRAZOLAM 0.25 MG PO TABS
0.5000 mg | ORAL_TABLET | Freq: Once | ORAL | Status: AC
  Administered 2020-03-28: 0.5 mg via ORAL
  Filled 2020-03-28: qty 2

## 2020-03-28 MED ORDER — ALPRAZOLAM 0.5 MG PO TABS: 0.5000 mg | ORAL_TABLET | Freq: Two times a day (BID) | ORAL | 0 refills | Status: AC | PRN

## 2020-03-28 NOTE — Discharge Instructions (Signed)
Please read and follow all provided instructions.  Your diagnoses today include:  1. Palpitations   2. Anxiety     Tests performed today include:  An EKG of your heart  A chest x-ray  Blood counts and electrolytes  Vital signs. See below for your results today.   Medications prescribed:   Alprazolam - medication for anxiety  Do not combine with alcohol as it can cause respiratory depression and potentially stop your breathing.  Do not perform any dangerous activities such as driving while taking this medication.  Take any prescribed medications only as directed.  Follow-up instructions: Please follow-up with your primary care provider as soon as you can for further evaluation of your symptoms.   Return instructions:  SEEK IMMEDIATE MEDICAL ATTENTION IF:  You have severe chest pain, especially if the pain is crushing or pressure-like and spreads to the arms, back, neck, or jaw, or if you have sweating, nausea (feeling sick to your stomach), or shortness of breath. THIS IS AN EMERGENCY. Don't wait to see if the pain will go away. Get medical help at once. Call 911 or 0 (operator). DO NOT drive yourself to the hospital.   You wake from sleep with chest pain or shortness of breath.  You feel dizzy or faint.  You have chest pain not typical of your usual pain for which you originally saw your caregiver.   You have any other emergent concerns regarding your health.  Your vital signs today were: BP 137/88   Pulse 66   Temp 98.4 F (36.9 C) (Oral)   Resp 16   SpO2 100%  If your blood pressure (BP) was elevated above 135/85 this visit, please have this repeated by your doctor within one month. --------------

## 2020-03-28 NOTE — ED Provider Notes (Signed)
[facility] EMER[facility]ENCY DEPARTMENT Provider Note   CSN: [id number] Arrival date & time: 03/28/20  1050     History Chief Complaint  Patient presents with  . Palpitations  . Anxiety    Madison Delgado is a 58 y.o. female.  Patient with history of palpitations, sinus tachycardia on beta-blocker, anxiety presents to the emergency department today for worsening palpitations over the past several days.  This morning the palpitations were more severe prompting emergency department visit.  She denies chest pain, shortness of breath, lightheadedness or syncope.  Patient has been taking her atenolol as prescribed, sometimes with an extra half a tablet.  Patient states that she feels the palpitations more when she is active.  She does admit to increased stress with new jobs and getting her house ready for family visitation.  This morning she thinks that she had an anxiety attack with fast respirations and tremors.  She has been drinking caffeine recently and using Flonase for nasal congestion.  She reports only 4 to 5 hours of sleep on average per night.          Past Medical History:  Diagnosis Date  . Fibroids 04/22/2013  . History of stress test    a. ETT 5/17: normal  . Hyperlipidemia   . Hypertension   . Obesity   . Palpitations   . Postmenopausal bleeding 04/22/2013  . S/P hysterectomy 04/22/2013  . Seasonal and perennial allergic rhinitis 03/18/2017    Patient Active Problem List   Diagnosis Date Noted  . 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 Inappropriate sinus tachycardia 12/24/2017  . Seasonal and perennial allergic rhinitis 03/18/2017  . Postmenopausal bleeding 04/22/2013  . Fibroids 04/22/2013  . S/P hysterectomy 04/22/2013  . Palpitations   . Hypertension   . Hyperlipidemia   . Obesity     Past Surgical History:  Procedure Laterality Date  . ADENOIDECTOMY    . BILATERAL  SALPINGECTOMY Bilateral 04/22/2013   Procedure: BILATERAL SALPINGECTOMY;  Surgeon: Elveria Royals, MD;  Location: [facility];  Service: Gynecology;  Laterality: Bilateral;  . CARPAL TUNNEL RELEASE    . ROBOTIC ASSISTED TOTAL HYSTERECTOMY N/A 04/22/2013   Procedure: ROBOTIC ASSISTED TOTAL HYSTERECTOMY;  Surgeon: Elveria Royals, MD;  Location: [facility];  Service: Gynecology;  Laterality: N/A;  3 1/2 hrs.  . TONSILLECTOMY    . WISDOM TOOTH EXTRACTION       OB History   No obstetric history on file.     Family History  Problem Relation Age of Onset  . Diabetes Mother   . Hypertension Mother   . Stroke Mother   . Coronary artery disease Father   . Heart attack Father   . Hypertension Sister   . Asthma Sister   . Breast cancer Neg Hx   . Allergic rhinitis Neg Hx   . Angioedema Neg Hx   . Atopy Neg Hx   . Eczema Neg Hx   . Immunodeficiency Neg Hx   . Urticaria Neg Hx     Social History   Tobacco Use  . Smoking status: Former Research scientist (life sciences)  . Smokeless tobacco: Never Used  Vaping Use  . Vaping Use: Never used  Substance Use Topics  . Alcohol use: No  . Drug use: No    Home Medications Prior to Admission medications   Medication Sig Start Date End Date Taking? Authorizing Provider  atenolol (TENORMIN) 50 MG tablet Take 1  tablet (50 mg total) by mouth daily. May take additional 25 mg (1/2 tablet) daily PRN for palpitations Patient taking differently: Take 25-50 mg by mouth See admin instructions. Take 50 mg by mouth in the morning and an additional 25 mg daily as needed for palpitations 07/08/19  Yes Jettie Booze, MD  Cyanocobalamin (VITAMIN B12) 500 MCG TABS Take 500 mcg by mouth daily.    Yes [provider]  fluticasone (FLONASE) 50 MCG/ACT nasal spray Place 2 sprays into both nostrils daily. Patient taking differently: Place 2 sprays into both nostrils daily as needed for allergies or rhinitis.  02/26/16  Yes Hedges, Dellis Filbert, PA-C  MAGNESIUM PO Take 1 tablet by  mouth daily with breakfast.    Yes [provider]  Multiple Vitamins-Calcium (ONE-A-DAY WOMENS FORMULA PO) Take 1 tablet by mouth daily with breakfast.   Yes [provider]  olmesartan (BENICAR) 40 MG tablet Take 1 tablet (40 mg total) by mouth daily. 07/08/19  Yes Jettie Booze, MD  tiZANidine (ZANAFLEX) 4 MG tablet Take 1 tablet (4 mg total) by mouth every 6 (six) hours as needed for muscle spasms. 01/31/20  Yes Wieters, Hallie C, PA-C  ALPRAZolam (XANAX) 0.5 MG tablet Take 1 tablet (0.5 mg total) by mouth 2 (two) times daily as needed for anxiety. 03/28/20   Carlisle Cater, PA-C  Fluticasone Propionate (XHANCE) 93 MCG/ACT EXHU Place 2 sprays into both nostrils 2 (two) times daily as needed. Patient not taking: Reported on 03/28/2020 03/13/17   Valentina Shaggy, MD  gabapentin (NEURONTIN) 300 MG capsule Take 1 capsule (300 mg total) by mouth 3 (three) times daily. Patient not taking: Reported on 03/28/2020 05/13/19   Wallene Huh, DPM  naproxen (NAPROSYN) 500 MG tablet Take 1 tablet (500 mg total) by mouth 2 (two) times daily. Patient not taking: Reported on 03/28/2020 01/31/20   Wieters, Madelynn Done C, PA-C    Allergies    Pork allergy  Review of Systems   Review of Systems  Constitutional: Negative for diaphoresis and fever.  Eyes: Negative for redness.  Respiratory: Negative for cough and shortness of breath.   Cardiovascular: Positive for palpitations. Negative for chest pain and leg swelling.  Gastrointestinal: Negative for abdominal pain, nausea and vomiting.  Genitourinary: Negative for dysuria.  Musculoskeletal: Negative for back pain and neck pain.  Skin: Negative for rash.  Neurological: Negative for syncope and light-headedness.  Psychiatric/Behavioral: Positive for sleep disturbance. The patient is nervous/anxious.     Physical Exam Updated Vital Signs BP 137/88   Pulse 66   Temp 98.4 F (36.9 C) (Oral)   Resp 16   SpO2 100%   Physical  Exam Vitals and nursing note reviewed.  Constitutional:      Appearance: She is well-developed. She is not diaphoretic.  HENT:     Head: Normocephalic and atraumatic.     Mouth/Throat:     Mouth: Mucous membranes are not dry.  Eyes:     Conjunctiva/sclera: Conjunctivae normal.  Neck:     Vascular: Normal carotid pulses. No carotid bruit or JVD.     Trachea: Trachea normal. No tracheal deviation.  Cardiovascular:     Rate and Rhythm: Normal rate and regular rhythm.     Pulses: No decreased pulses.     Heart sounds: Normal heart sounds, S1 normal and S2 normal. No murmur heard.   Pulmonary:     Effort: Pulmonary effort is normal. No respiratory distress.     Breath sounds: No wheezing.  Chest:     Chest wall: No tenderness.  Abdominal:     General: Bowel sounds are normal.     Palpations: Abdomen is soft.     Tenderness: There is no abdominal tenderness. There is no guarding or rebound.  Musculoskeletal:        General: Normal range of motion.     Cervical back: Normal range of motion and neck supple. No muscular tenderness.  Skin:    General: Skin is warm and dry.     Coloration: Skin is not pale.  Neurological:     Mental Status: She is alert.     ED Results / Procedures / Treatments   Labs (all labs ordered are listed, but only abnormal results are displayed) Labs Reviewed  BASIC METABOLIC PANEL - Abnormal; Notable for the following components:      Result Value   Glucose, Bld 130 (*)    All other components within normal limits  I-STAT BETA HCG BLOOD, ED ([facility], [facility], [facility] ONLY) - Abnormal; Notable for the following components:   I-stat hCG, quantitative 5.7 (*)    All other components within normal limits  CBC    EKG EKG Interpretation  Date/Time:  Monday March 28 2020 18:25:32 EST Ventricular Rate:  74 PR Interval:    QRS Duration: 85 QT Interval:  363 QTC Calculation: 403 R Axis:   47 Text Interpretation: Sinus rhythm When compared to prior, more  artifact. No STEMI Confirmed by Antony Blackbird [phone]) on 03/28/2020 6:28:25 PM   Radiology DG Chest 2 View  Result Date: 03/28/2020 CLINICAL DATA:  Palpitations. EXAM: CHEST - 2 VIEW COMPARISON:  03/15/2018 FINDINGS: The lungs are clear without focal pneumonia, edema, pneumothorax or pleural effusion. The cardiopericardial silhouette is within normal limits for size. The visualized bony structures of the thorax show no acute abnormality. IMPRESSION: No active cardiopulmonary disease. Electronically Signed   By: Misty Stanley M.D.   On: 03/28/2020 11:28    Procedures Procedures (including critical care time)  Medications Ordered in ED Medications  ALPRAZolam Duanne Moron) tablet 0.5 mg (0.5 mg Oral Given 03/28/20 1852)    ED Course  I have reviewed the triage vital signs and the nursing notes.  Pertinent labs & imaging results that were available during my care of the patient were reviewed by me and considered in my medical decision making (see chart for details).  Patient seen and examined.  Patient is doing well.  Will repeat EKG.  Discussed reassuring lab results and chest x-ray.  Patient will be given a dose of oral alprazolam and given a prescription for the same, #6 tablets.  Well while talking with the patient, she felt her heart jump, and it was noted to have a simultaneous PAC noted on the monitor.  Vital signs reviewed and are as follows: BP (!) 150/80 (BP Location: Right Arm)   Pulse 64   Temp 98.1 F (36.7 C) (Oral)   Resp 16   SpO2 100%   Patient stable ready for discharged home at this time.  Encouraged return with worsening symptoms and close follow-up with her primary care doctor.  We discussed avoidance of different things which can exacerbate palpitations and anxiety.    MDM Rules/Calculators/A&P                          Patient presents with palpitations and what sounds like a panic attack this morning.  She is doing well.  Heart rate is  normal range.  EKG reviewed  without any signs or evidence for arrhythmias.  This includes prolonged QT, signs of WPW or Brugada syndrome, hypertrophy, heart block, or other concerning features.  Patient will be given a small amount of her anxiolytic, which she has been prescribed in the past and has not filled since 01/12/2019.  Labs and chest x-ray unremarkable.  Patient has appropriate follow-up.  Stable for discharge.   Final Clinical Impression(s) / ED Diagnoses Final diagnoses:  Palpitations  Anxiety    Rx / DC Orders ED Discharge Orders         Ordered    ALPRAZolam (XANAX) 0.5 MG tablet  2 times daily PRN        03/28/20 1847           Carlisle Cater, PA-C 03/28/20 1900    Tegeler, Gwenyth Allegra, MD 03/28/20 2322

## 2020-03-28 NOTE — Progress Notes (Signed)
Patient came into today to be cast for Custom Foot Orthotics. Upon recommendation of Dr. Paulla Dolly  Patient presents with hx of capsutlitis and chronic foot pain   Goals are RF stability, longituinal arch support and foretoot cushioning  Plan vendor Richie

## 2020-03-28 NOTE — ED Triage Notes (Signed)
Pt reports palpitations that began a few days ago as well as feeling like she may be having a panic attack, states she increased her atenolol per her doctors order but is still having palpitations. Reports she has taken xanax in the past for anxiety but not recently. A/ox4. Denies chest pain. Does endorse an increase in stress recently.

## 2020-03-30 DIAGNOSIS — R002 Palpitations: Secondary | ICD-10-CM | POA: Diagnosis not present

## 2020-03-30 DIAGNOSIS — F419 Anxiety disorder, unspecified: Secondary | ICD-10-CM | POA: Diagnosis not present

## 2020-04-04 NOTE — Progress Notes (Signed)
Cardiology Office Note   Date:  04/05/2020   ID:  Madison Delgado, DOB [birth year], MRN [id number]  PCP:  Wenda Low, MD    No chief complaint on file.  palpitations  Wt Readings from Last 3 Encounters:  04/05/20 198 lb 3.2 oz (89.9 kg)  07/08/19 198 lb 6.4 oz (90 kg)  05/19/19 202 lb 3.2 oz (91.7 kg)       History of Present Illness: Madison Delgado is a 59 y.o. female  with history of inappropriate sinus tachycardia diagnosed at [facility] treated with beta-blocker,normal LV function on 2D echo 2013, hypertension, HLD, obesity. Normal GXT 2017.  Son is an Chief Financial Officer at [employer] in [place], [place].   Palpitations have been worse with stress in the past.  She has tried magnesium without any relief.  LVEF normal.  Minimally elevated PA pressure in December 2019.  Anxiety increased when she had to go back to work. She is trying to avoid Xanax use.   She has had a sleep study recommended In the past but she was too claustrophobic and did not have the test.  Graduated Masters program in Dec 2020.   Seen in ER in 11/21 for what sounded like panic attack. Normal labs.  Negative cardiac w/u. She was given anxiolytic, Xanax.   She continues to have a sensation of skipped beats. This has made it harder to sleep.     Past Medical History:  Diagnosis Date  . Fibroids 04/22/2013  . History of stress test    a. ETT 5/17: normal  . Hyperlipidemia   . Hypertension   . Obesity   . Palpitations   . Postmenopausal bleeding 04/22/2013  . S/P hysterectomy 04/22/2013  . Seasonal and perennial allergic rhinitis 03/18/2017    Past Surgical History:  Procedure Laterality Date  . ADENOIDECTOMY    . BILATERAL SALPINGECTOMY Bilateral 04/22/2013   Procedure: BILATERAL SALPINGECTOMY;  Surgeon: Elveria Royals, MD;  Location: [facility];  Service: Gynecology;  Laterality: Bilateral;  . CARPAL TUNNEL RELEASE    . ROBOTIC ASSISTED TOTAL HYSTERECTOMY N/A  04/22/2013   Procedure: ROBOTIC ASSISTED TOTAL HYSTERECTOMY;  Surgeon: Elveria Royals, MD;  Location: [facility];  Service: Gynecology;  Laterality: N/A;  3 1/2 hrs.  . TONSILLECTOMY    . WISDOM TOOTH EXTRACTION       Current Outpatient Medications  Medication Sig Dispense Refill  . ALPRAZolam (XANAX) 0.5 MG tablet Take 1 tablet (0.5 mg total) by mouth 2 (two) times daily as needed for anxiety. 6 tablet 0  . Cyanocobalamin (VITAMIN B12) 500 MCG TABS Take 500 mcg by mouth daily.     Marland Kitchen Desvenlafaxine Succinate ER 25 MG TB24 Take by mouth.    . fluticasone (FLONASE) 50 MCG/ACT nasal spray Place 2 sprays into both nostrils daily. 9.9 g 2  . MAGNESIUM PO Take 1 tablet by mouth daily with breakfast.     . metoprolol tartrate (LOPRESSOR) 50 MG tablet Take 50 mg by mouth 2 (two) times daily.    . Multiple Vitamins-Calcium (ONE-A-DAY WOMENS FORMULA PO) Take 1 tablet by mouth daily with breakfast.    . olmesartan (BENICAR) 40 MG tablet Take 1 tablet (40 mg total) by mouth daily. 90 tablet 3  . tiZANidine (ZANAFLEX) 4 MG tablet Take 1 tablet (4 mg total) by mouth every 6 (six) hours as needed for muscle spasms. 30 tablet 0   No current facility-administered medications for this visit.    Allergies:  Pork allergy    Social History:  The patient  reports that she has quit smoking. She has never used smokeless tobacco. She reports that she does not drink alcohol and does not use drugs.   Family History:  The patient's family history includes Asthma in her sister; Coronary artery disease in her father; Diabetes in her mother; Heart attack in her father; Hypertension in her mother and sister; Stroke in her mother.    ROS:  Please see the history of present illness.   Otherwise, review of systems are positive for increased stress.   All other systems are reviewed and negative.    PHYSICAL EXAM: VS:  BP 126/80   Pulse 78   Ht 5\' 3"  (1.6 m)   Wt 198 lb 3.2 oz (89.9 kg)   SpO2 98%   BMI 35.11 kg/m   , BMI Body mass index is 35.11 kg/m. GEN: Well nourished, well developed, in no acute distress  HEENT: normal  Neck: no JVD, carotid bruits, or masses Cardiac: RRR; no murmurs, rubs, or gallops,; tr ankle edema bilaterally Respiratory:  clear to auscultation bilaterally, normal work of breathing GI: soft, nontender, nondistended, + BS MS: no deformity or atrophy  Skin: warm and dry, no rash Neuro:  Strength and sensation are intact Psych: euthymic mood, full affect   EKG:   The ekg ordered 03/28/20 demonstrates NSR, no ectopic beats   Recent Labs: 03/28/2020: BUN 14; Creatinine, Ser 0.96; Hemoglobin 13.5; Platelets 311; Potassium 3.8; Sodium 138   Lipid Panel No results found for: CHOL, TRIG, HDL, CHOLHDL, VLDL, LDLCALC, LDLDIRECT   Other studies Reviewed: Additional studies/ records that were reviewed today with results demonstrating: hospital records reviewed.   ASSESSMENT AND PLAN:  1. Palpitations:  She has tried metoprolol for 5 days, no difference in her sx.  She may choose to go back to the atenolol.  COuld try atenolol 25 mg in AM and 50 mg in the evening since her sx are more prominent in the evening.  Could still do am extra 25 mg as she had been doing.   2. HTN: The current medical regimen is effective;  continue present plan and medications. 3. Leg swelling: Elevate legs at night.  Some mild venous insufficiency.      Current medicines are reviewed at length with the patient today.  The patient concerns regarding her medicines were addressed.  The following changes have been made:  No change  Labs/ tests ordered today include:  No orders of the defined types were placed in this encounter.   Recommend 150 minutes/week of aerobic exercise Low fat, low carb, high fiber diet recommended  Disposition:   FU in 6 months   Signed, Larae Grooms, MD  04/05/2020 3:54 PM    [facility] [address], [place], [place]  [address] Phone:  [phone]; Fax: [phone]

## 2020-04-05 ENCOUNTER — Encounter: Payer: Self-pay | Admitting: Interventional Cardiology

## 2020-04-05 ENCOUNTER — Ambulatory Visit: Payer: BC Managed Care – PPO | Admitting: Interventional Cardiology

## 2020-04-05 ENCOUNTER — Other Ambulatory Visit: Payer: Self-pay

## 2020-04-05 VITALS — BP 126/80 | HR 78 | Ht 63.0 in | Wt 198.2 lb

## 2020-04-05 DIAGNOSIS — R002 Palpitations: Secondary | ICD-10-CM

## 2020-04-05 DIAGNOSIS — E782 Mixed hyperlipidemia: Secondary | ICD-10-CM | POA: Diagnosis not present

## 2020-04-05 DIAGNOSIS — R000 Tachycardia, unspecified: Secondary | ICD-10-CM

## 2020-04-05 DIAGNOSIS — I10 Essential (primary) hypertension: Secondary | ICD-10-CM

## 2020-04-05 DIAGNOSIS — G4730 Sleep apnea, unspecified: Secondary | ICD-10-CM

## 2020-04-05 NOTE — Patient Instructions (Signed)

## 2020-05-03 DIAGNOSIS — M542 Cervicalgia: Secondary | ICD-10-CM | POA: Diagnosis not present

## 2020-05-12 DIAGNOSIS — Z0000 Encounter for general adult medical examination without abnormal findings: Secondary | ICD-10-CM | POA: Diagnosis not present

## 2020-05-12 DIAGNOSIS — G43919 Migraine, unspecified, intractable, without status migrainosus: Secondary | ICD-10-CM | POA: Diagnosis not present

## 2020-05-12 DIAGNOSIS — R7303 Prediabetes: Secondary | ICD-10-CM | POA: Diagnosis not present

## 2020-05-12 DIAGNOSIS — Z13220 Encounter for screening for lipoid disorders: Secondary | ICD-10-CM | POA: Diagnosis not present

## 2020-05-12 DIAGNOSIS — Z23 Encounter for immunization: Secondary | ICD-10-CM | POA: Diagnosis not present

## 2020-05-12 DIAGNOSIS — I10 Essential (primary) hypertension: Secondary | ICD-10-CM | POA: Diagnosis not present

## 2020-07-02 ENCOUNTER — Other Ambulatory Visit: Payer: Self-pay | Admitting: Interventional Cardiology

## 2020-07-04 MED ORDER — OLMESARTAN MEDOXOMIL 40 MG PO TABS
40.0000 mg | ORAL_TABLET | Freq: Every day | ORAL | 3 refills | Status: DC
Start: 2020-07-04 — End: 2020-11-10

## 2020-07-05 ENCOUNTER — Ambulatory Visit: Payer: BC Managed Care – PPO | Admitting: Interventional Cardiology

## 2020-08-01 ENCOUNTER — Other Ambulatory Visit: Payer: Self-pay | Admitting: *Deleted

## 2020-08-02 MED ORDER — ATENOLOL 50 MG PO TABS: ORAL_TABLET | ORAL | 2 refills | Status: DC

## 2020-08-02 NOTE — Telephone Encounter (Signed)
Received refill for atenolol.  Not on current med list.  Per office note from 04/05/20 with Dr Irish Lack--  Madison Delgado:  1. Palpitations:  Madison Delgado has tried metoprolol for 5 days, no difference in her sx.  Madison Delgado may choose to go back to the atenolol.  COuld try atenolol 25 mg in AM and 50 mg in the evening since her sx are more prominent in the evening.  Could still do am extra 25 mg as Madison Delgado had been doing   I spoke with patient. Madison Delgado has stopped metoprolol.  Madison Delgado is taking atenolol as listed on refill request.  Dose is 50 mg daily with extra 1/2 tablet daily as needed for palpitations.  Patient reports this is working well.  Will send in refill.

## 2020-08-03 ENCOUNTER — Ambulatory Visit (INDEPENDENT_AMBULATORY_CARE_PROVIDER_SITE_OTHER): Payer: BC Managed Care – PPO | Admitting: Family Medicine

## 2020-08-03 ENCOUNTER — Other Ambulatory Visit: Payer: Self-pay

## 2020-08-03 ENCOUNTER — Encounter (INDEPENDENT_AMBULATORY_CARE_PROVIDER_SITE_OTHER): Payer: Self-pay | Admitting: Family Medicine

## 2020-08-03 VITALS — BP 130/68 | HR 82 | Temp 98.1°F | Ht 63.0 in | Wt 199.0 lb

## 2020-08-03 DIAGNOSIS — E538 Deficiency of other specified B group vitamins: Secondary | ICD-10-CM | POA: Diagnosis not present

## 2020-08-03 DIAGNOSIS — Z0289 Encounter for other administrative examinations: Secondary | ICD-10-CM

## 2020-08-03 DIAGNOSIS — R0602 Shortness of breath: Secondary | ICD-10-CM

## 2020-08-03 DIAGNOSIS — G8929 Other chronic pain: Secondary | ICD-10-CM | POA: Insufficient documentation

## 2020-08-03 DIAGNOSIS — R7303 Prediabetes: Secondary | ICD-10-CM | POA: Diagnosis not present

## 2020-08-03 DIAGNOSIS — F411 Generalized anxiety disorder: Secondary | ICD-10-CM | POA: Diagnosis not present

## 2020-08-03 DIAGNOSIS — Z1331 Encounter for screening for depression: Secondary | ICD-10-CM

## 2020-08-03 DIAGNOSIS — I10 Essential (primary) hypertension: Secondary | ICD-10-CM

## 2020-08-03 DIAGNOSIS — Z9189 Other specified personal risk factors, not elsewhere classified: Secondary | ICD-10-CM

## 2020-08-03 DIAGNOSIS — M2550 Pain in unspecified joint: Secondary | ICD-10-CM

## 2020-08-03 DIAGNOSIS — Z6835 Body mass index (BMI) 35.0-35.9, adult: Secondary | ICD-10-CM

## 2020-08-03 DIAGNOSIS — R5383 Other fatigue: Secondary | ICD-10-CM | POA: Insufficient documentation

## 2020-08-04 LAB — COMPREHENSIVE METABOLIC PANEL
ALT: 27 IU/L (ref 0–32)
AST: 22 IU/L (ref 0–40)
Albumin/Globulin Ratio: 1.8 (ref 1.2–2.2)
Albumin: 4.4 g/dL (ref 3.8–4.9)
Alkaline Phosphatase: 52 IU/L (ref 44–121)
BUN/Creatinine Ratio: 23 (ref 9–23)
BUN: 18 mg/dL (ref 6–24)
Bilirubin Total: 0.2 mg/dL (ref 0.0–1.2)
CO2: 21 mmol/L (ref 20–29)
Calcium: 9.4 mg/dL (ref 8.7–10.2)
Chloride: 103 mmol/L (ref 96–106)
Creatinine, Ser: 0.79 mg/dL (ref 0.57–1.00)
Globulin, Total: 2.5 g/dL (ref 1.5–4.5)
Glucose: 94 mg/dL (ref 65–99)
Potassium: 4.5 mmol/L (ref 3.5–5.2)
Sodium: 142 mmol/L (ref 134–144)
Total Protein: 6.9 g/dL (ref 6.0–8.5)
eGFR: 87 mL/min/{1.73_m2} (ref >59)

## 2020-08-04 LAB — CBC WITH DIFFERENTIAL/PLATELET
Basophils Absolute: 0 10*3/uL (ref 0.0–0.2)
Basos: 0 %
EOS (ABSOLUTE): 0.1 10*3/uL (ref 0.0–0.4)
Eos: 2 %
Hematocrit: 41.1 % (ref 34.0–46.6)
Hemoglobin: 13.3 g/dL (ref 11.1–15.9)
Immature Grans (Abs): 0 10*3/uL (ref 0.0–0.1)
Immature Granulocytes: 0 %
Lymphocytes Absolute: 1.7 10*3/uL (ref 0.7–3.1)
Lymphs: 38 %
MCH: 27 pg (ref 26.6–33.0)
MCHC: 32.4 g/dL (ref 31.5–35.7)
MCV: 83 fL (ref 79–97)
Monocytes Absolute: 0.5 10*3/uL (ref 0.1–0.9)
Monocytes: 10 %
Neutrophils Absolute: 2.3 10*3/uL (ref 1.4–7.0)
Neutrophils: 50 %
Platelets: 275 10*3/uL (ref 150–450)
RBC: 4.93 x10E6/uL (ref 3.77–5.28)
RDW: 14.1 % (ref 11.7–15.4)
WBC: 4.6 10*3/uL (ref 3.4–10.8)

## 2020-08-04 LAB — LIPID PANEL
Chol/HDL Ratio: 3 ratio (ref 0.0–4.4)
Cholesterol, Total: 163 mg/dL (ref 100–199)
HDL: 54 mg/dL (ref >39)
LDL Chol Calc (NIH): 90 mg/dL (ref 0–99)
Triglycerides: 103 mg/dL (ref 0–149)
VLDL Cholesterol Cal: 19 mg/dL (ref 5–40)

## 2020-08-04 LAB — VITAMIN D 25 HYDROXY (VIT D DEFICIENCY, FRACTURES): Vit D, 25-Hydroxy: 34.3 ng/mL (ref 30.0–100.0)

## 2020-08-04 LAB — VITAMIN B12: Vitamin B-12: >2000 pg/mL — ABNORMAL HIGH (ref 232–1245)

## 2020-08-04 LAB — T3: T3, Total: 106 ng/dL (ref 71–180)

## 2020-08-04 LAB — T4, FREE: Free T4: 1.2 ng/dL (ref 0.82–1.77)

## 2020-08-04 LAB — FOLATE: Folate: >20 ng/mL (ref >3.0)

## 2020-08-04 LAB — INSULIN, RANDOM: INSULIN: 9.2 u[IU]/mL (ref 2.6–24.9)

## 2020-08-04 LAB — TSH: TSH: 0.54 u[IU]/mL (ref 0.450–4.500)

## 2020-08-04 LAB — HEMOGLOBIN A1C
Est. average glucose Bld gHb Est-mCnc: 128 mg/dL
Hgb A1c MFr Bld: 6.1 % — ABNORMAL HIGH (ref 4.8–5.6)

## 2020-08-09 NOTE — Progress Notes (Signed)
Chief Complaint:   OBESITY Madison Delgado (MR# [id number]) is a 60 y.o. female who presents for evaluation and treatment of obesity and related comorbidities. Current BMI is Body mass index is 35.25 kg/m. Madison Delgado has been struggling with her weight for many years and has been unsuccessful in either losing weight, maintaining weight loss, or reaching her healthy weight goal.  Madison Delgado is currently in the action stage of change and ready to dedicate time achieving and maintaining a healthier weight. Madison Delgado is interested in becoming our patient and working on intensive lifestyle modifications including (but not limited to) diet and exercise for weight loss.  Madison Delgado works full time (60 hours) and has 3 children (none at home).  She is divorced/single.  She has not tried any diets/weight loss programs in the past.  Craves fried foods, [place] food.  Snacks on chocolate, candy, popcorn.  Does not eat pork.  PCP is at [facility].  Madison Delgado's habits were reviewed today and are as follows: she thinks her family will eat healthier with her, her desired weight loss is 35 pounds, she started gaining weight 8 years ago, her heaviest weight ever was 205 pounds, she craves fried foods (chicken), she frequently makes poor food choices, she frequently eats larger portions than normal and she struggles with emotional eating.  Depression Screen Madison Delgado's Food and Mood (modified PHQ-9) score was 6.  Depression screen [facility] 2/9 08/03/2020  Decreased Interest 1  Down, Depressed, Hopeless 1  PHQ - 2 Score 2  Altered sleeping 1  Tired, decreased energy 1  Change in appetite 1  Feeling bad or failure about yourself  1  Trouble concentrating 0  Moving slowly or fidgety/restless 0  Suicidal thoughts 0  PHQ-9 Score 6  Difficult doing work/chores Not difficult at all   Assessment/Plan:   Orders Placed This Encounter  Procedures  . Vitamin B12  . CBC with Differential/Platelet  . Comprehensive  metabolic panel  . Folate  . Hemoglobin A1c  . Insulin, random  . Lipid panel  . T3  . T4, free  . TSH  . VITAMIN D 25 Hydroxy (Vit-D Deficiency, Fractures)    Medications Discontinued During This Encounter  Medication Reason  . tiZANidine (ZANAFLEX) 4 MG tablet   . fluticasone (FLONASE) 50 MCG/ACT nasal spray      No orders of the defined types were placed in this encounter.    1. Other fatigue Luvia denies daytime somnolence and reports waking up still tired. Patent has a history of symptoms of morning fatigue, morning headache and snoring. Madison Delgado generally gets 6 hours of sleep per night, and states that she has generally restful sleep. Snoring is present. Apneic episodes are present. Epworth Sleepiness Score is 4.  Sybella does feel that her weight is causing her energy to be lower than it should be. Fatigue may be related to obesity, depression or many other causes. Labs will be ordered, and in the meanwhile, Cerinity will focus on self care including making healthy food choices, increasing physical activity and focusing on stress reduction.  Will check labs today.  - CBC with Differential/Platelet - Comprehensive metabolic panel - Lipid panel - T3 - T4, free - TSH - VITAMIN D 25 Hydroxy (Vit-D Deficiency, Fractures)  2. SOBOE (shortness of breath on exertion) [name] notes increasing shortness of breath with exercising and seems to be worsening over time with weight gain. She notes getting out of breath sooner with activity than she used to. This has gotten  worse recently. Madison Delgado denies shortness of breath at rest or orthopnea.  Madison Delgado does feel that she gets out of breath more easily that she used to when she exercises. Madison Delgado's shortness of breath appears to be obesity related and exercise induced. She has agreed to work on weight loss and gradually increase exercise to treat her exercise induced shortness of breath. Will continue to monitor closely.  Will check IC  and labs today.  - CBC with Differential/Platelet  3. Essential hypertension Medications: atenolol 50 mg daily, Benicar 40 mg daily.    Plan:  Blood pressure above goal.  Will recheck in 2 weeks.  Avoid buying foods that are: processed, frozen, or prepackaged to avoid excess salt.  Increase water intake and follow prudent nutritional plan.  Ambulatory blood pressure monitoring was encouraged with a goal of at least 2-3 times weekly or when feeling poorly.  She was instructed to keep a log for [name] to review at each office visit.  Will check labs today.  BP Readings from Last 3 Encounters:  08/17/20 118/73  08/03/20 130/68  04/05/20 126/80   Lab Results  Component Value Date   CREATININE 0.79 08/03/2020   4. Prediabetes Not at goal. Goal is HgbA1c < 5.7.  Medication: None.  Positive family history of diabetes.  In 04/2020, A1c was 6.0.  Plan:  She will continue to focus on protein-rich, low simple carbohydrate foods. We reviewed the importance of hydration, regular exercise for stress reduction, and restorative sleep.  Will check labs today, as per below.  Lab Results  Component Value Date   HGBA1C 6.1 (H) 08/03/2020   Lab Results  Component Value Date   INSULIN 9.2 08/03/2020   - Comprehensive metabolic panel - Hemoglobin A1c - Insulin, random  5. B12 deficiency Lab Results  Component Value Date   VITAMINB12 >2000 (H) 08/03/2020   Supplementation: OTC vitamin B12 1,000 mcg daily.   Plan:  Continue current treatment.  Will check vitamin B12 level and folate today.  - Vitamin B12 - Folate  6. 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  Will check labs, follow prudent nutritional plan, weight loss.  7. GAD, with emotional eating Mild emotional eating habits.  Eats when bored, especially in the afternoons at work.  Denies depressed mood.  She is taking desvenlafaxine 25 mg daily and Xanax 0.5 mg twice daily as needed for anxiety.  Plan:  Check  labs.  Symptoms well controlled at current.  Consider referral to Dr. Mallie Mussel in the future if habits change or she wants a referral.  Behavior modification techniques were discussed today to help [name] deal with her anxiety.  Orders and follow up as documented in patient record.   8. Depression screening Sevanna was screened for depression as part of her new patient workup today.  PHQ-9 is 6.  9. At risk for diabetes mellitus - Ruthella was given diabetes prevention education and counseling today of more than 24 minutes.  - Counseled patient on pathophysiology of disease and meaning/ implication of lab results.  - Reviewed how certain foods can either stimulate or inhibit insulin release, and subsequently affect hunger pathways  - Importance of following a healthy meal plan with limiting amounts of simple carbohydrates discussed with patient - Effects of regular aerobic exercise on blood sugar regulation reviewed and encouraged an eventual goal of 30 min 5d/week or more as a minimum.  - Briefly discussed treatment options, which always include dietary and lifestyle modification  as first line.   - Handouts provided at patient's desire and/or told to go online to the American Diabetes Association website for further information.  10. Class 2 severe obesity with serious comorbidity and body mass index (BMI) of 35.0 to 35.9 in adult, unspecified obesity type (HCC)  Celine is currently in the action stage of change and her goal is to continue with weight loss efforts. I recommend Carolyna begin the structured treatment plan as follows:  She has agreed to the Category 2 Plan.  Exercise goals: As is.   Behavioral modification strategies: increasing lean protein intake, decreasing simple carbohydrates, decreasing liquid calories, meal planning and cooking strategies and planning for success.  She was informed of the importance of frequent follow-up visits to maximize her success with intensive  lifestyle modifications for her multiple health conditions. She was informed we would discuss her lab results at her next visit unless there is a critical issue that needs to be addressed sooner. Chirstine agreed to keep her next visit at the agreed upon time to discuss these results.  Objective:   Blood pressure 130/68, pulse 82, temperature 98.1 F (36.7 C), height 5\' 3"  (1.6 m), weight 199 lb (90.3 kg), SpO2 99 %. Body mass index is 35.25 kg/m.  Indirect Calorimeter completed today shows a VO2 of 250 and a REE of 1737.  Her calculated basal metabolic rate is 7510 thus her basal metabolic rate is better than expected.  General: Cooperative, alert, well developed, in no acute distress. HEENT: Conjunctivae and lids unremarkable. Cardiovascular: Regular rhythm.  Lungs: Normal work of breathing. Neurologic: No focal deficits.   Lab Results  Component Value Date   CREATININE 0.79 08/03/2020   BUN 18 08/03/2020   NA 142 08/03/2020   K 4.5 08/03/2020   CL 103 08/03/2020   CO2 21 08/03/2020   Lab Results  Component Value Date   ALT 27 08/03/2020   AST 22 08/03/2020   ALKPHOS 52 08/03/2020   BILITOT 0.2 08/03/2020   Lab Results  Component Value Date   HGBA1C 6.1 (H) 08/03/2020   Lab Results  Component Value Date   INSULIN 9.2 08/03/2020   Lab Results  Component Value Date   TSH 0.540 08/03/2020   Lab Results  Component Value Date   CHOL 163 08/03/2020   HDL 54 08/03/2020   LDLCALC 90 08/03/2020   TRIG 103 08/03/2020   CHOLHDL 3.0 08/03/2020   Lab Results  Component Value Date   WBC 4.6 08/03/2020   HGB 13.3 08/03/2020   HCT 41.1 08/03/2020   MCV 83 08/03/2020   PLT 275 08/03/2020   Attestation Statements:   This is the patient's first visit at [facility]. The patient's NEW PATIENT PACKET was reviewed at length. Included in the packet: current and past health history, medications, allergies, ROS, gynecologic history (women only), surgical history,  family history, social history, weight history, weight loss surgery history (for those that have had weight loss surgery), nutritional evaluation, mood and food questionnaire, PHQ9, Epworth questionnaire, sleep habits questionnaire, patient life and health improvement goals questionnaire. These will all be scanned into the patient's chart under media.   During the visit, I independently reviewed the patient's EKG, bioimpedance scale results, and indirect calorimeter results. I used this information to tailor a meal plan for the patient that will help her to lose weight and will improve her obesity-related conditions going forward. I performed a medically necessary appropriate examination and/or evaluation. I discussed the assessment and  treatment plan with the patient. The patient was provided an opportunity to ask questions and all were answered. The patient agreed with the plan and demonstrated an understanding of the instructions. Labs were ordered at this visit and will be reviewed at the next visit unless more critical results need to be addressed immediately. Clinical information was updated and documented in the EMR.   I, Water quality scientist, CMA, am acting as Location manager for [employer], DO.  I have reviewed the above documentation for accuracy and completeness, and I agree with the above. Marjory Sneddon, D.O.  The [place] was signed into law in 2016 which includes the topic of electronic health records.  This provides immediate access to information in MyChart.  This includes consultation notes, operative notes, office notes, lab results and pathology reports.  If you have any questions about what you read please let us know at your next visit so we can discuss your concerns and take corrective action if need be.  We are right here with you.

## 2020-08-17 ENCOUNTER — Ambulatory Visit (INDEPENDENT_AMBULATORY_CARE_PROVIDER_SITE_OTHER): Payer: BC Managed Care – PPO | Admitting: Family Medicine

## 2020-08-17 ENCOUNTER — Other Ambulatory Visit: Payer: Self-pay

## 2020-08-17 ENCOUNTER — Encounter (INDEPENDENT_AMBULATORY_CARE_PROVIDER_SITE_OTHER): Payer: Self-pay | Admitting: Family Medicine

## 2020-08-17 VITALS — BP 118/73 | HR 69 | Temp 98.6°F | Ht 63.0 in | Wt 195.0 lb

## 2020-08-17 DIAGNOSIS — E538 Deficiency of other specified B group vitamins: Secondary | ICD-10-CM | POA: Diagnosis not present

## 2020-08-17 DIAGNOSIS — R7303 Prediabetes: Secondary | ICD-10-CM

## 2020-08-17 DIAGNOSIS — E559 Vitamin D deficiency, unspecified: Secondary | ICD-10-CM | POA: Diagnosis not present

## 2020-08-17 DIAGNOSIS — Z6835 Body mass index (BMI) 35.0-35.9, adult: Secondary | ICD-10-CM

## 2020-08-17 DIAGNOSIS — I10 Essential (primary) hypertension: Secondary | ICD-10-CM

## 2020-08-17 DIAGNOSIS — E66812 Obesity, class 2: Secondary | ICD-10-CM

## 2020-08-17 MED ORDER — VITAMIN D (ERGOCALCIFEROL) 1.25 MG (50000 UNIT) PO CAPS: 50000.0000 [IU] | ORAL_CAPSULE | ORAL | 0 refills | Status: DC

## 2020-08-23 NOTE — Progress Notes (Signed)
Chief Complaint:   OBESITY Madison Delgado is here to discuss her progress with her obesity treatment plan along with follow-up of her obesity related diagnoses.   Today's visit was #: 2 Starting weight: 199 lbs Starting date: 08/03/2020 Today's weight: 195 lbs Today's date: 08/17/2020 Total lbs lost to date: 4 lbs Body mass index is 34.54 kg/m.  Total weight loss percentage to date: -2.01%  Interim History:   Madison Delgado is here today for her first follow-up office visit since starting the program with [name].   We reviewed her NEW Meal Plan and discussed all recent labs done here and/ or done at outside facilities.  Extended time was spent counseling Madison Delgado on all new disease processes that were discovered or that are worsening.    she is following the meal plan with only minor concerns/ questions today.   Patient's meal and food recall appears to be accurate and consistent with what is on the plan when she is following it.   When on plan, her hunger and cravings are well controlled.    Madison Delgado snacks on chocolate.  She is drinking 100 ounces of water per day everyday.  Plan:  Handouts provided on emotional eating and prediabetes/insulin resistance.  Current Meal Plan: the Category 2 Plan for 98% of the time.  Current Exercise Plan: Walking the dog and doing YouTube videos for 20-45 minutes 4 times per week.  Assessment/Plan:   Meds ordered this encounter  Medications  . Vitamin D, Ergocalciferol, (DRISDOL) 1.25 MG (50000 UNIT) CAPS capsule    Sig: Take 1 capsule (50,000 Units total) by mouth every 7 (seven) days.    Dispense:  4 capsule    Refill:  0     1. Prediabetes Not at goal. Goal is HgbA1c < 5.7.  Medication: None.    Plan:  Discussed labs with patient today.  She will continue to focus on protein-rich, low simple carbohydrate foods. We reviewed the importance of hydration, regular exercise for stress reduction, and restorative sleep.   Lab Results   Component Value Date   HGBA1C 6.1 (H) 08/03/2020   Lab Results  Component Value Date   INSULIN 9.2 08/03/2020   2. Essential hypertension At goal. Medications: atenolol 50 mg daily, Benicar 40 mg daily.  At home, blood pressure 120/70-80.  Denies dizziness or issues now, but did have a couple days at the start of the meal plan of seconds of feeling her equilibrium was off.  Plan:  Discussed labs with patient today.  At goal.  Continue medications.  Avoid buying foods that are: processed, frozen, or prepackaged to avoid excess salt. Ambulatory blood pressure monitoring was encouraged with a goal of at least 2-3 times weekly or when feeling poorly.  She was instructed to keep a log for [name] to review at each office visit.   We will continue to monitor closely alongside her PCP and/or Specialist.  Regular follow up with PCP and specialists was also encouraged.   BP Readings from Last 3 Encounters:  08/17/20 118/73  08/03/20 130/68  04/05/20 126/80   Lab Results  Component Value Date   CREATININE 0.79 08/03/2020   3. B12 deficiency Lab Results  Component Value Date   VITAMINB12 >2000 (H) 08/03/2020   Supplementation: OTC vitamin B12 500 mcg daily.   Plan:  Discussed labs with patient today.  Cut current B12 supplement dose in half.  4. Vitamin D deficiency Not at goal. Current vitamin D is 34.3,  tested on 08/03/2020. Optimal goal > 50 ng/dL.  She is taking vitamin D 50,000 IU weekly.   Plan: Discussed labs with patient today.  - Discussed importance of vitamin D to their health and well-being.  - possible symptoms of low Vitamin D can be low energy, depressed mood, muscle aches, joint aches, osteoporosis etc. - low Vitamin D levels may be linked to an increased risk of cardiovascular events and even increased risk of cancers- such as colon and breast.  - I recommend pt take weekly prescription vit D - see script below   - Informed patient this may be a lifelong thing, and she was  encouraged to continue to take the medicine until told otherwise.   - we will need to monitor levels regularly (every 3-4 mo on average) to keep levels within normal limits.  - weight loss will likely improve availability of vitamin D, thus encouraged Madison Delgado to continue with meal plan and their weight loss efforts to further improve this condition - pt's questions and concerns regarding this condition addressed.  - Start Vitamin D, Ergocalciferol, (DRISDOL) 1.25 MG (50000 UNIT) CAPS capsule; Take 1 capsule (50,000 Units total) by mouth every 7 (seven) days.  Dispense: 4 capsule; Refill: 0  5. Obesity with current BMI of 34.6  Course: Madison Delgado is currently in the action stage of change. As such, her goal is to continue with weight loss efforts.   Nutrition goals: She has agreed to the Category 2 Plan.   Exercise goals: As is.  Behavioral modification strategies: increasing lean protein intake, decreasing simple carbohydrates, better snacking choices, emotional eating strategies and planning for success.  Madison Delgado has agreed to follow-up with our clinic in 2 weeks. She was informed of the importance of frequent follow-up visits to maximize her success with intensive lifestyle modifications for her multiple health conditions.   Objective:   Blood pressure 118/73, pulse 69, temperature 98.6 F (37 C), height 5\' 3"  (1.6 m), weight 195 lb (88.5 kg), SpO2 99 %. Body mass index is 34.54 kg/m.  General: Cooperative, alert, well developed, in no acute distress. HEENT: Conjunctivae and lids unremarkable. Cardiovascular: Regular rhythm.  Lungs: Normal work of breathing. Neurologic: No focal deficits.   Lab Results  Component Value Date   CREATININE 0.79 08/03/2020   BUN 18 08/03/2020   NA 142 08/03/2020   K 4.5 08/03/2020   CL 103 08/03/2020   CO2 21 08/03/2020   Lab Results  Component Value Date   ALT 27 08/03/2020   AST 22 08/03/2020   ALKPHOS 52 08/03/2020   BILITOT 0.2 08/03/2020    Lab Results  Component Value Date   HGBA1C 6.1 (H) 08/03/2020   Lab Results  Component Value Date   INSULIN 9.2 08/03/2020   Lab Results  Component Value Date   TSH 0.540 08/03/2020   Lab Results  Component Value Date   CHOL 163 08/03/2020   HDL 54 08/03/2020   LDLCALC 90 08/03/2020   TRIG 103 08/03/2020   CHOLHDL 3.0 08/03/2020   Lab Results  Component Value Date   WBC 4.6 08/03/2020   HGB 13.3 08/03/2020   HCT 41.1 08/03/2020   MCV 83 08/03/2020   PLT 275 08/03/2020   Attestation Statements:   Reviewed by clinician on day of visit: allergies, medications, problem list, medical history, surgical history, family history, social history, and previous encounter notes.  Time spent on visit including pre-visit chart review and post-visit care and charting was >45 minutes.   I,  Water quality scientist, [place], am acting as Location manager for [employer], DO.  I have reviewed the above documentation for accuracy and completeness, and I agree with the above. Marjory Sneddon, D.O.  The [place] was signed into law in 2016 which includes the topic of electronic health records.  This provides immediate access to information in MyChart.  This includes consultation notes, operative notes, office notes, lab results and pathology reports.  If you have any questions about what you read please let us know at your next visit so we can discuss your concerns and take corrective action if need be.  We are right here with you.   Meds ordered this encounter  Medications  . Vitamin D, Ergocalciferol, (DRISDOL) 1.25 MG (50000 UNIT) CAPS capsule    Sig: Take 1 capsule (50,000 Units total) by mouth every 7 (seven) days.    Dispense:  4 capsule    Refill:  0

## 2020-08-25 ENCOUNTER — Ambulatory Visit: Payer: BC Managed Care – PPO | Admitting: Podiatry

## 2020-08-25 ENCOUNTER — Other Ambulatory Visit: Payer: Self-pay

## 2020-08-25 ENCOUNTER — Encounter: Payer: Self-pay | Admitting: Podiatry

## 2020-08-25 DIAGNOSIS — L600 Ingrowing nail: Secondary | ICD-10-CM

## 2020-08-25 NOTE — Patient Instructions (Signed)

## 2020-08-28 NOTE — Progress Notes (Signed)
Subjective:   Patient ID: Madison Delgado, female   DOB: 60 y.o.   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  Explained procedure risk and patient wants surgery understanding risk and I infiltrated the right hallux 60 mg like Marcaine mixture sterile prep done and using sterile instrumentation remove medial border exposed matrix applied phenol 3 applications 30 seconds followed by alcohol lavage and sterile dressing.  Gave instructions on soaks reappoint encouraged to call questions concerns and leave dressing on 24 hours take off earlier if any throbbing were to occur

## 2020-08-31 DIAGNOSIS — H65193 Other acute nonsuppurative otitis media, bilateral: Secondary | ICD-10-CM | POA: Diagnosis not present

## 2020-08-31 DIAGNOSIS — F419 Anxiety disorder, unspecified: Secondary | ICD-10-CM | POA: Diagnosis not present

## 2020-09-12 ENCOUNTER — Ambulatory Visit (INDEPENDENT_AMBULATORY_CARE_PROVIDER_SITE_OTHER): Payer: BC Managed Care – PPO | Admitting: Family Medicine

## 2020-09-12 ENCOUNTER — Other Ambulatory Visit: Payer: Self-pay

## 2020-09-12 ENCOUNTER — Encounter (INDEPENDENT_AMBULATORY_CARE_PROVIDER_SITE_OTHER): Payer: Self-pay | Admitting: Family Medicine

## 2020-09-12 VITALS — BP 132/85 | HR 82 | Temp 98.0°F | Ht 63.0 in | Wt 196.0 lb

## 2020-09-12 DIAGNOSIS — E559 Vitamin D deficiency, unspecified: Secondary | ICD-10-CM

## 2020-09-12 DIAGNOSIS — Z9189 Other specified personal risk factors, not elsewhere classified: Secondary | ICD-10-CM

## 2020-09-12 DIAGNOSIS — Z6835 Body mass index (BMI) 35.0-35.9, adult: Secondary | ICD-10-CM

## 2020-09-12 DIAGNOSIS — Z20822 Contact with and (suspected) exposure to covid-19: Secondary | ICD-10-CM | POA: Diagnosis not present

## 2020-09-12 MED ORDER — VITAMIN D (ERGOCALCIFEROL) 1.25 MG (50000 UNIT) PO CAPS: 50000.0000 [IU] | ORAL_CAPSULE | ORAL | 0 refills | Status: DC

## 2020-09-19 NOTE — Progress Notes (Signed)
Chief Complaint:   OBESITY Madison Delgado is here to discuss her progress with her obesity treatment plan along with follow-up of her obesity related diagnoses.   Today's visit was #: 3 Starting weight: 199 lbs Starting date: 08/03/2020 Today's weight: 198 lbs Today's date: 09/12/2020 Total lbs lost to date: 1 lb Body mass index is 34.72 kg/m.  Total weight loss percentage to date: -0.50%  Interim History:  Madison Delgado was away at a conference and ate out the entire time.  No issues with the meal plan and overall likes it.  Hunger and cravings controlled when on plan.  Current Meal Plan: the Category 2 Plan for 30-40% of the time.  Current Exercise Plan: Walking for 25 minutes 4 times per week.  Assessment/Plan:   1. Vitamin D deficiency Not at goal. Current vitamin D is 34.3, tested on 08/03/2020. Optimal goal > 50 ng/dL.  She is taking vitamin D 50,000 IU weekly.  Plan: - Reiterated importance of vitamin D (as well as calcium) to their health and wellbeing.  - Reminded Madison Delgado that weight loss will likely improve availability of vitamin D, thus encouraged her to continue with meal plan and their weight loss efforts to further improve this condition. - I recommend patient continue to take weekly prescription vit D 50,000 IU - Informed patient this may be a lifelong thing, and she was encouraged to continue to take the medicine until told otherwise.   - we will need to monitor levels regularly (every 3-4 mo on average) to keep levels within normal limits.  - weight loss will likely improve availability of vitamin D, thus encouraged Madison Delgado to continue with meal plan and their weight loss efforts to further improve this condition - pt's questions and concerns regarding this condition addressed.  - Refill Vitamin D, Ergocalciferol, (DRISDOL) 1.25 MG (50000 UNIT) CAPS capsule; Take 1 capsule (50,000 Units total) by mouth every 7 (seven) days.  Dispense: 4 capsule; Refill: 0  2. At  risk for osteoporosis Madison Delgado was given approximately 9 minutes of osteoporosis prevention counseling today.  Madison Delgado is at risk for osteopenia and osteoporosis due to Vitamin D deficiency, as well as other risk factors.  We discussed the importance of prudent screenings through her PCP's office for prevention.  Madison Delgado was encouraged to take her Vitamin D and follow her calcium rich diet.  We will continue to monitor vitamin D levels to ensure treatment is appropriate.  It is recommended that she eventually engage in weight bearing exercises and muscle strengthening exercises to help improve bone density and decrease her risk of osteopenia and osteoporosis.  3. Obesity, current BMI 34.9  Course: Madison Delgado is currently in the action stage of change. As such, her goal is to continue with weight loss efforts.   Nutrition goals: She has agreed to the Category 2 Plan.   Exercise goals: For substantial health benefits, adults should do at least 150 minutes (2 hours and 30 minutes) a week of moderate-intensity, or 75 minutes (1 hour and 15 minutes) a week of vigorous-intensity aerobic physical activity, or an equivalent combination of moderate- and vigorous-intensity aerobic activity. Aerobic activity should be performed in episodes of at least 10 minutes, and preferably, it should be spread throughout the week.  Behavioral modification strategies: meal planning and cooking strategies, keeping healthy foods in the home, travel eating strategies and planning for success.  Madison Delgado has agreed to follow-up with our clinic in 2-3 weeks. She was informed of the importance of  frequent follow-up visits to maximize her success with intensive lifestyle modifications for her multiple health conditions.   Objective:   Blood pressure 132/85, pulse 82, temperature 98 F (36.7 C), height 5\' 3"  (1.6 m), weight 196 lb (88.9 kg), SpO2 96 %. Body mass index is 34.72 kg/m.  General: Cooperative, alert, well developed, in  no acute distress. HEENT: Conjunctivae and lids unremarkable. Cardiovascular: Regular rhythm.  Lungs: Normal work of breathing. Neurologic: No focal deficits.   Lab Results  Component Value Date   CREATININE 0.79 08/03/2020   BUN 18 08/03/2020   NA 142 08/03/2020   K 4.5 08/03/2020   CL 103 08/03/2020   CO2 21 08/03/2020   Lab Results  Component Value Date   ALT 27 08/03/2020   AST 22 08/03/2020   ALKPHOS 52 08/03/2020   BILITOT 0.2 08/03/2020   Lab Results  Component Value Date   HGBA1C 6.1 (H) 08/03/2020   Lab Results  Component Value Date   INSULIN 9.2 08/03/2020   Lab Results  Component Value Date   TSH 0.540 08/03/2020   Lab Results  Component Value Date   CHOL 163 08/03/2020   HDL 54 08/03/2020   LDLCALC 90 08/03/2020   TRIG 103 08/03/2020   CHOLHDL 3.0 08/03/2020   Lab Results  Component Value Date   WBC 4.6 08/03/2020   HGB 13.3 08/03/2020   HCT 41.1 08/03/2020   MCV 83 08/03/2020   PLT 275 08/03/2020   Attestation Statements:   Reviewed by clinician on day of visit: allergies, medications, problem list, medical history, surgical history, family history, social history, and previous encounter notes.  I, Water quality scientist, CMA, am acting as Location manager for [employer], DO.  I have reviewed the above documentation for accuracy and completeness, and I agree with the above. Marjory Sneddon, D.O.  The [place] was signed into law in 2016 which includes the topic of electronic health records.  This provides immediate access to information in MyChart.  This includes consultation notes, operative notes, office notes, lab results and pathology reports.  If you have any questions about what you read please let us know at your next visit so we can discuss your concerns and take corrective action if need be.  We are right here with you.

## 2020-09-27 ENCOUNTER — Encounter (INDEPENDENT_AMBULATORY_CARE_PROVIDER_SITE_OTHER): Payer: Self-pay | Admitting: Family Medicine

## 2020-09-27 ENCOUNTER — Other Ambulatory Visit: Payer: Self-pay

## 2020-09-27 ENCOUNTER — Ambulatory Visit (INDEPENDENT_AMBULATORY_CARE_PROVIDER_SITE_OTHER): Payer: BC Managed Care – PPO | Admitting: Family Medicine

## 2020-09-27 VITALS — BP 121/75 | HR 69 | Temp 98.7°F | Ht 63.0 in | Wt 190.0 lb

## 2020-09-27 DIAGNOSIS — Z6835 Body mass index (BMI) 35.0-35.9, adult: Secondary | ICD-10-CM

## 2020-09-27 DIAGNOSIS — E559 Vitamin D deficiency, unspecified: Secondary | ICD-10-CM | POA: Diagnosis not present

## 2020-09-27 DIAGNOSIS — Z9189 Other specified personal risk factors, not elsewhere classified: Secondary | ICD-10-CM | POA: Diagnosis not present

## 2020-09-27 DIAGNOSIS — E66812 Obesity, class 2: Secondary | ICD-10-CM

## 2020-09-27 MED ORDER — VITAMIN D (ERGOCALCIFEROL) 1.25 MG (50000 UNIT) PO CAPS: 50000.0000 [IU] | ORAL_CAPSULE | ORAL | 0 refills | Status: DC

## 2020-09-29 NOTE — Progress Notes (Signed)
Chief Complaint:   OBESITY Madison Delgado is here to discuss her progress with her obesity treatment plan along with follow-up of her obesity related diagnoses.   Today's visit was #: 4 Starting weight: 199 lbs Starting date: 08/03/2020 Today's weight: 190 lbs Today's date: 09/27/2020 Weight change since last visit: 6 lbs Total lbs lost to date: 9 lbs Body mass index is 33.66 kg/m.  Total weight loss percentage to date: -4.52%  Interim History:  Madison Delgado did a lot of yard work with spreading mulch, etc., and she attributes this to her weight loss.  Plan:  Focus on protein snack in the afternoon.  Increase water intake to 100+ ounces per day, especially when working outside.  Current Meal Plan: the Category 2 Plan for 70% of the time.  Current Exercise Plan: Yard work for 120 minutes 3 times per week.  Assessment/Plan:   Medications Discontinued During This Encounter  Medication Reason  . Vitamin D, Ergocalciferol, (DRISDOL) 1.25 MG (50000 UNIT) CAPS capsule Reorder     Meds ordered this encounter  Medications  . Vitamin D, Ergocalciferol, (DRISDOL) 1.25 MG (50000 UNIT) CAPS capsule    Sig: Take 1 capsule (50,000 Units total) by mouth every 7 (seven) days.    Dispense:  4 capsule    Refill:  0    1. Vitamin D deficiency Not at goal. Current vitamin D is 34.3, tested on 08/03/2020. Optimal goal > 50 ng/dL.  She is taking vitamin D 50,000 IU weekly.   Plan: Continue to take prescription Vitamin D @50 ,000 IU every week as prescribed.  Follow-up for routine testing of Vitamin D, at least 2-3 times per year to avoid over-replacement.  - Refill Vitamin D, Ergocalciferol, (DRISDOL) 1.25 MG (50000 UNIT) CAPS capsule; Take 1 capsule (50,000 Units total) by mouth every 7 (seven) days.  Dispense: 4 capsule; Refill: 0  2. At risk for dehydration Madison Delgado is at higher than average risk of dehydration.  Madison Delgado was given more than 9 minutes of proper hydration counseling today.  We  discussed the signs and symptoms of dehydration, some of which may include muscle cramping, constipation or even orthostatic symptoms.  Counseling on the prevention of dehydration was also provided today.  Madison Delgado is at risk for dehydration due to weight loss, lifestyle and behavorial habits and possibly due to taking certain medication(s).  She was encouraged to adequately hydrate and monitor fluid status to avoid dehydration as well as weight loss plateaus.  Unless pre-existing renal or cardiopulmonary conditions exist, in which patient was told to limit their fluid intake, I recommended roughly one half of their weight in pounds to be the approximate ounces of non-caloric, non-caffeinated beverages they should drink per day; including more if they are engaging in exercise.  3. Obesity, current BMI 33.7  Course: Madison Delgado is currently in the action stage of change. As such, her goal is to continue with weight loss efforts.   Nutrition goals: She has agreed to the Category 2 Plan.   Exercise goals: As is.  150 minutes per week.  Behavioral modification strategies: increasing lean protein intake and increasing water intake.  Madison Delgado has agreed to follow-up with our clinic in 2-3 weeks. She was informed of the importance of frequent follow-up visits to maximize her success with intensive lifestyle modifications for her multiple health conditions.   Objective:   Blood pressure 121/75, pulse 69, temperature 98.7 F (37.1 C), height 5\' 3"  (1.6 m), weight 190 lb (86.2 kg), SpO2 99 %. Body  mass index is 33.66 kg/m.  General: Cooperative, alert, well developed, in no acute distress. HEENT: Conjunctivae and lids unremarkable. Cardiovascular: Regular rhythm.  Lungs: Normal work of breathing. Neurologic: No focal deficits.   Lab Results  Component Value Date   CREATININE 0.79 08/03/2020   BUN 18 08/03/2020   NA 142 08/03/2020   K 4.5 08/03/2020   CL 103 08/03/2020   CO2 21 08/03/2020   Lab  Results  Component Value Date   ALT 27 08/03/2020   AST 22 08/03/2020   ALKPHOS 52 08/03/2020   BILITOT 0.2 08/03/2020   Lab Results  Component Value Date   HGBA1C 6.1 (H) 08/03/2020   Lab Results  Component Value Date   INSULIN 9.2 08/03/2020   Lab Results  Component Value Date   TSH 0.540 08/03/2020   Lab Results  Component Value Date   CHOL 163 08/03/2020   HDL 54 08/03/2020   LDLCALC 90 08/03/2020   TRIG 103 08/03/2020   CHOLHDL 3.0 08/03/2020   Lab Results  Component Value Date   WBC 4.6 08/03/2020   HGB 13.3 08/03/2020   HCT 41.1 08/03/2020   MCV 83 08/03/2020   PLT 275 08/03/2020   Attestation Statements:   Reviewed by clinician on day of visit: allergies, medications, problem list, medical history, surgical history, family history, social history, and previous encounter notes.  I, Water quality scientist, CMA, am acting as Location manager for [employer], DO.  I have reviewed the above documentation for accuracy and completeness, and I agree with the above. Marjory Sneddon, D.O.  The [place] was signed into law in 2016 which includes the topic of electronic health records.  This provides immediate access to information in MyChart.  This includes consultation notes, operative notes, office notes, lab results and pathology reports.  If you have any questions about what you read please let us know at your next visit so we can discuss your concerns and take corrective action if need be.  We are right here with you.

## 2020-10-19 ENCOUNTER — Other Ambulatory Visit: Payer: Self-pay

## 2020-10-19 ENCOUNTER — Encounter (INDEPENDENT_AMBULATORY_CARE_PROVIDER_SITE_OTHER): Payer: Self-pay | Admitting: Family Medicine

## 2020-10-19 ENCOUNTER — Ambulatory Visit (INDEPENDENT_AMBULATORY_CARE_PROVIDER_SITE_OTHER): Payer: BC Managed Care – PPO | Admitting: Family Medicine

## 2020-10-19 VITALS — BP 100/63 | HR 72 | Temp 99.0°F | Ht 63.0 in | Wt 189.0 lb

## 2020-10-19 DIAGNOSIS — I10 Essential (primary) hypertension: Secondary | ICD-10-CM | POA: Diagnosis not present

## 2020-10-19 DIAGNOSIS — E559 Vitamin D deficiency, unspecified: Secondary | ICD-10-CM | POA: Diagnosis not present

## 2020-10-19 DIAGNOSIS — Z6835 Body mass index (BMI) 35.0-35.9, adult: Secondary | ICD-10-CM

## 2020-10-19 DIAGNOSIS — Z9189 Other specified personal risk factors, not elsewhere classified: Secondary | ICD-10-CM

## 2020-10-19 MED ORDER — VITAMIN D (ERGOCALCIFEROL) 1.25 MG (50000 UNIT) PO CAPS: 50000.0000 [IU] | ORAL_CAPSULE | ORAL | 0 refills | Status: DC

## 2020-10-27 NOTE — Progress Notes (Signed)
Chief Complaint:   OBESITY Madison Delgado is here to discuss her progress with her obesity treatment plan along with follow-up of her obesity related diagnoses.   Today's visit was #: 5 Starting weight: 199 lbs Starting date: 08/03/2020 Today's weight: 189 lbs Today's date: 10/19/2020 Weight change since last visit: 1 lb Total lbs lost to date: 10 lbs Body mass index is 33.48 kg/m.  Total weight loss percentage to date: -5.03%  Interim History: Madison Delgado goes out a lot at work for lunch and dinner.  She eats out twice weekly usually.  Snacks on Muscle Milk, Skinny Pop, fruits.  Drinking more water prior.  Not up to 100 ounces when working outside.  Goal is to eat off plan once per week.  Current Meal Plan: the Category 2 Plan for 40% of the time.  Current Exercise Plan: Walking/yard work for 30 minutes 2-3 times per week.  Assessment/Plan:   Medications Discontinued During This Encounter  Medication Reason   Vitamin D, Ergocalciferol, (DRISDOL) 1.25 MG (50000 UNIT) CAPS capsule Reorder   Meds ordered this encounter  Medications   Vitamin D, Ergocalciferol, (DRISDOL) 1.25 MG (50000 UNIT) CAPS capsule    Sig: Take 1 capsule (50,000 Units total) by mouth every 7 (seven) days.    Dispense:  4 capsule    Refill:  0   1. Essential hypertension At goal. Medications: atenolol 50 mg daily, Benicar 40 mg daily.    Plan: Avoid buying foods that are: processed, frozen, or prepackaged to avoid excess salt. We will watch for signs of hypotension as she continues lifestyle modifications. We will continue to monitor closely alongside her PCP and/or Specialist.  Regular follow up with PCP and specialists was also encouraged.  Low normal blood pressure but asymptomatic/no concerns.  Check at home and if remains low, consider cutting Benicar in half.  BP Readings from Last 3 Encounters:  10/19/20 100/63  09/27/20 121/75  09/12/20 132/85   Lab Results  Component Value Date   CREATININE 0.79  08/03/2020   2. Vitamin D deficiency Not at goal. Current vitamin D is 34.3, tested on 08/03/2020. Optimal goal > 50 ng/dL.  She is taking vitamin D 50,000 IU weekly.  Plan: Continue to take prescription Vitamin D @50 ,000 IU every week as prescribed.  Follow-up for routine testing of Vitamin D, at least 2-3 times per year to avoid over-replacement.  - Refill Vitamin D, Ergocalciferol, (DRISDOL) 1.25 MG (50000 UNIT) CAPS capsule; Take 1 capsule (50,000 Units total) by mouth every 7 (seven) days.  Dispense: 4 capsule; Refill: 0  3. At risk for complication associated with hypotension Madison Delgado was given approximately 9 minutes of education and counseling today regarding the condition of hypotension, the pathophysiology of the condition and concerns regarding prevention and treatment of this condition.  We discussed risks of medications used to treat hypertension, which in the setting of weight loss, can inadvertently cause hypotension.  Signs of hypotension such as feeling lightheaded or unsteady, esp when getting up first thing in the AM or off the toilet, were reviewed in detail and all questions were answered.  The use of motivational interviewing was employed as a technique as well today to aid in the treatment of patient's multiple conditions.  Pt understands importance of proper hydration and also of more prudent home BP monitoring while actively losing weight.  she will call us, or their PCP,  or other specialists who treat their BP with medications, with any questions or concerns that may  develop.    4. Obesity, current BMI 33.5  Course: Madison Delgado is currently in the action stage of change. As such, her goal is to continue with weight loss efforts.   Nutrition goals: She has agreed to the Category 2 Plan.   Exercise goals: For substantial health benefits, adults should do at least 150 minutes (2 hours and 30 minutes) a week of moderate-intensity, or 75 minutes (1 hour and 15 minutes) a week of  vigorous-intensity aerobic physical activity, or an equivalent combination of moderate- and vigorous-intensity aerobic activity. Aerobic activity should be performed in episodes of at least 10 minutes, and preferably, it should be spread throughout the week.  Behavioral modification strategies: increasing lean protein intake, decreasing simple carbohydrates, increasing water intake, dealing with family or coworker sabotage, and planning for success.  Madison Delgado has agreed to follow-up with our clinic in 2-3 weeks. She was informed of the importance of frequent follow-up visits to maximize her success with intensive lifestyle modifications for her multiple health conditions.   Objective:   Blood pressure 100/63, pulse 72, temperature 99 F (37.2 C), height 5\' 3"  (1.6 m), weight 189 lb (85.7 kg), SpO2 99 %. Body mass index is 33.48 kg/m.  General: Cooperative, alert, well developed, in no acute distress. HEENT: Conjunctivae and lids unremarkable. Cardiovascular: Regular rhythm.  Lungs: Normal work of breathing. Neurologic: No focal deficits.   Lab Results  Component Value Date   CREATININE 0.79 08/03/2020   BUN 18 08/03/2020   NA 142 08/03/2020   K 4.5 08/03/2020   CL 103 08/03/2020   CO2 21 08/03/2020   Lab Results  Component Value Date   ALT 27 08/03/2020   AST 22 08/03/2020   ALKPHOS 52 08/03/2020   BILITOT 0.2 08/03/2020   Lab Results  Component Value Date   HGBA1C 6.1 (H) 08/03/2020   Lab Results  Component Value Date   INSULIN 9.2 08/03/2020   Lab Results  Component Value Date   TSH 0.540 08/03/2020   Lab Results  Component Value Date   CHOL 163 08/03/2020   HDL 54 08/03/2020   LDLCALC 90 08/03/2020   TRIG 103 08/03/2020   CHOLHDL 3.0 08/03/2020   Lab Results  Component Value Date   WBC 4.6 08/03/2020   HGB 13.3 08/03/2020   HCT 41.1 08/03/2020   MCV 83 08/03/2020   PLT 275 08/03/2020   Attestation Statements:   Reviewed by clinician on day of visit:  allergies, medications, problem list, medical history, surgical history, family history, social history, and previous encounter notes.  I, Water quality scientist, CMA, am acting as Location manager for [employer], DO.  I have reviewed the above documentation for accuracy and completeness, and I agree with the above. Marjory Sneddon, D.O.  The [place] was signed into law in 2016 which includes the topic of electronic health records.  This provides immediate access to information in MyChart.  This includes consultation notes, operative notes, office notes, lab results and pathology reports.  If you have any questions about what you read please let us know at your next visit so we can discuss your concerns and take corrective action if need be.  We are right here with you.

## 2020-10-28 DIAGNOSIS — H6983 Other specified disorders of Eustachian tube, bilateral: Secondary | ICD-10-CM | POA: Diagnosis not present

## 2020-10-28 DIAGNOSIS — H9203 Otalgia, bilateral: Secondary | ICD-10-CM | POA: Diagnosis not present

## 2020-11-10 ENCOUNTER — Ambulatory Visit (INDEPENDENT_AMBULATORY_CARE_PROVIDER_SITE_OTHER): Payer: BC Managed Care – PPO | Admitting: Family Medicine

## 2020-11-10 ENCOUNTER — Encounter (INDEPENDENT_AMBULATORY_CARE_PROVIDER_SITE_OTHER): Payer: Self-pay | Admitting: Family Medicine

## 2020-11-10 ENCOUNTER — Other Ambulatory Visit: Payer: Self-pay

## 2020-11-10 VITALS — BP 108/71 | HR 82 | Temp 98.6°F | Ht 63.0 in | Wt 191.0 lb

## 2020-11-10 DIAGNOSIS — I10 Essential (primary) hypertension: Secondary | ICD-10-CM | POA: Diagnosis not present

## 2020-11-10 DIAGNOSIS — R7303 Prediabetes: Secondary | ICD-10-CM

## 2020-11-10 DIAGNOSIS — E559 Vitamin D deficiency, unspecified: Secondary | ICD-10-CM | POA: Diagnosis not present

## 2020-11-10 DIAGNOSIS — Z9189 Other specified personal risk factors, not elsewhere classified: Secondary | ICD-10-CM

## 2020-11-10 DIAGNOSIS — Z6835 Body mass index (BMI) 35.0-35.9, adult: Secondary | ICD-10-CM

## 2020-11-10 MED ORDER — OLMESARTAN MEDOXOMIL 40 MG PO TABS: 20.0000 mg | ORAL_TABLET | Freq: Every day | ORAL | 3 refills | Status: DC

## 2020-11-10 MED ORDER — VITAMIN D (ERGOCALCIFEROL) 1.25 MG (50000 UNIT) PO CAPS: 50000.0000 [IU] | ORAL_CAPSULE | ORAL | 0 refills | Status: DC

## 2020-11-21 NOTE — Progress Notes (Signed)
Chief Complaint:   OBESITY Madison Delgado is here to discuss her progress with her obesity treatment plan along with follow-up of her obesity related diagnoses.   Today's visit was #: 6 Starting weight: 199 lbs Starting date: 08/03/2020 Today's weight: 191 lbs Today's date: 11/10/2020 Weight change since last visit: +2 lbs Total lbs lost to date: 8 lbs Body mass index is 33.83 kg/m.  Total weight loss percentage to date: -4.02%  Interim History:  Madison Delgado is going out with colleagues and doing more celebration eating with ETOH drinks and "bar foods".  She has had increased carb cravings.  No issues with meal plan.  [birth year] is her birthday.  She will be going to [place] with friends over the weekend.  Current Meal Plan: the Category 2 Plan for 50% of the time.  Current Exercise Plan: None.  Assessment/Plan:   Medications Discontinued During This Encounter  Medication Reason   Vitamin D, Ergocalciferol, (DRISDOL) 1.25 MG (50000 UNIT) CAPS capsule Reorder   olmesartan (BENICAR) 40 MG tablet    Meds ordered this encounter  Medications   Vitamin D, Ergocalciferol, (DRISDOL) 1.25 MG (50000 UNIT) CAPS capsule    Sig: Take 1 capsule (50,000 Units total) by mouth every 7 (seven) days.    Dispense:  4 capsule    Refill:  0    Ov for RF   olmesartan (BENICAR) 40 MG tablet    Sig: Take 0.5 tablets (20 mg total) by mouth daily.    Dispense:  90 tablet    Refill:  3   1. Prediabetes Not at goal. Goal is HgbA1c < 5.7.  Medication: None.  With carb cravings.  Eating a lot of carbs and not getting in her proteins when she eats out several times per week.  Plan:  She will continue to focus on protein-rich, low simple carbohydrate foods. We reviewed the importance of hydration, regular exercise for stress reduction, and restorative sleep.  Declines metformin, even after discussion today.  Handouts given and she will focus on prudent nutritional plan at that time.  Lab Results  Component Value  Date   HGBA1C 6.1 (H) 08/03/2020   Lab Results  Component Value Date   INSULIN 9.2 08/03/2020   2. Vitamin D deficiency Not at goal.   She is taking vitamin D 50,000 IU weekly.    Plan: Continue to take prescription Vitamin D @50 ,000 IU every week as prescribed.  Follow-up for routine testing of Vitamin D, at least 2-3 times per year to avoid over-replacement.  Lab Results  Component Value Date   VD25OH 34.3 08/03/2020   - Refill Vitamin D, Ergocalciferol, (DRISDOL) 1.25 MG (50000 UNIT) CAPS capsule; Take 1 capsule (50,000 Units total) by mouth every 7 (seven) days.  Dispense: 4 capsule; Refill: 0  3. Essential hypertension At goal. Medications: Benicar 20 mg daily, atenolol 50 mg daily.  A few dizzy/lightheaded episodes.  Does not last long, but occurs with orthostatic position changes.  Plan: Avoid buying foods that are: processed, frozen, or prepackaged to avoid excess salt. We will continue to monitor closely alongside her PCP and/or Specialist.  Regular follow up with PCP and specialists was also encouraged.  Will decrease Benicar from 40 mg daily to 20 mg daily.  BP Readings from Last 3 Encounters:  11/10/20 108/71  10/19/20 100/63  09/27/20 121/75   Lab Results  Component Value Date   CREATININE 0.79 08/03/2020   - Decrease and refill olmesartan (BENICAR) 40 MG tablet; Take 0.5  tablets (20 mg total) by mouth daily.  Dispense: 90 tablet; Refill: 3  4. At risk for diabetes mellitus - [place] was given diabetes prevention education and counseling today of more than 10 minutes.  - Counseled patient on pathophysiology of disease and meaning/ implication of lab results.  - Reviewed how certain foods can either stimulate or inhibit insulin release, and subsequently affect hunger pathways  - Importance of following a healthy meal plan with limiting amounts of simple carbohydrates discussed with patient - Effects of regular aerobic exercise on blood sugar regulation reviewed and  encouraged an eventual goal of 30 min 5d/week or more as a minimum.  - Briefly discussed treatment options, which always include dietary and lifestyle modification as first line.   - Handouts provided at patient's desire and/or told to go online to the American Diabetes Association website for further information.  5. Obesity with current BMI 33.9  Course: Madison Delgado is currently in the action stage of change. As such, her goal is to continue with weight loss efforts.   Nutrition goals: She has agreed to the Category 2 Plan.   Exercise goals: All adults should avoid inactivity. Some physical activity is better than none, and adults who participate in any amount of physical activity gain some health benefits.  Behavioral modification strategies: travel eating strategies, celebration eating strategies, and planning for success.  Tea has agreed to follow-up with our clinic in 3 weeks. She was informed of the importance of frequent follow-up visits to maximize her success with intensive lifestyle modifications for her multiple health conditions.   Objective:   Blood pressure 108/71, pulse 82, temperature 98.6 F (37 C), height 5\' 3"  (1.6 m), weight 191 lb (86.6 kg), SpO2 99 %. Body mass index is 33.83 kg/m.  General: Cooperative, alert, well developed, in no acute distress. HEENT: Conjunctivae and lids unremarkable. Cardiovascular: Regular rhythm.  Lungs: Normal work of breathing. Neurologic: No focal deficits.   Lab Results  Component Value Date   CREATININE 0.79 08/03/2020   BUN 18 08/03/2020   NA 142 08/03/2020   K 4.5 08/03/2020   CL 103 08/03/2020   CO2 21 08/03/2020   Lab Results  Component Value Date   ALT 27 08/03/2020   AST 22 08/03/2020   ALKPHOS 52 08/03/2020   BILITOT 0.2 08/03/2020   Lab Results  Component Value Date   HGBA1C 6.1 (H) 08/03/2020   Lab Results  Component Value Date   INSULIN 9.2 08/03/2020   Lab Results  Component Value Date   TSH 0.540  08/03/2020   Lab Results  Component Value Date   CHOL 163 08/03/2020   HDL 54 08/03/2020   LDLCALC 90 08/03/2020   TRIG 103 08/03/2020   CHOLHDL 3.0 08/03/2020   Lab Results  Component Value Date   VD25OH 34.3 08/03/2020   Lab Results  Component Value Date   WBC 4.6 08/03/2020   HGB 13.3 08/03/2020   HCT 41.1 08/03/2020   MCV 83 08/03/2020   PLT 275 08/03/2020   Attestation Statements:   Reviewed by clinician on day of visit: allergies, medications, problem list, medical history, surgical history, family history, social history, and previous encounter notes.  I, Water quality scientist, CMA, am acting as Location manager for [employer], DO.  I have reviewed the above documentation for accuracy and completeness, and I agree with the above. Marjory Sneddon, D.O.  The [place] was signed into law in 2016 which includes the topic of electronic  health records.  This provides immediate access to information in MyChart.  This includes consultation notes, operative notes, office notes, lab results and pathology reports.  If you have any questions about what you read please let us know at your next visit so we can discuss your concerns and take corrective action if need be.  We are right here with you.

## 2020-11-28 DIAGNOSIS — Z23 Encounter for immunization: Secondary | ICD-10-CM | POA: Diagnosis not present

## 2020-11-28 DIAGNOSIS — I10 Essential (primary) hypertension: Secondary | ICD-10-CM | POA: Diagnosis not present

## 2020-11-28 DIAGNOSIS — H6980 Other specified disorders of Eustachian tube, unspecified ear: Secondary | ICD-10-CM | POA: Diagnosis not present

## 2020-11-28 DIAGNOSIS — F419 Anxiety disorder, unspecified: Secondary | ICD-10-CM | POA: Diagnosis not present

## 2020-11-28 DIAGNOSIS — R7303 Prediabetes: Secondary | ICD-10-CM | POA: Diagnosis not present

## 2020-12-05 ENCOUNTER — Ambulatory Visit (INDEPENDENT_AMBULATORY_CARE_PROVIDER_SITE_OTHER): Payer: BC Managed Care – PPO | Admitting: Family Medicine

## 2020-12-05 ENCOUNTER — Other Ambulatory Visit: Payer: Self-pay

## 2020-12-05 ENCOUNTER — Encounter (INDEPENDENT_AMBULATORY_CARE_PROVIDER_SITE_OTHER): Payer: Self-pay | Admitting: Family Medicine

## 2020-12-05 VITALS — BP 114/77 | HR 67 | Temp 98.2°F | Ht 63.0 in | Wt 190.0 lb

## 2020-12-05 DIAGNOSIS — E538 Deficiency of other specified B group vitamins: Secondary | ICD-10-CM

## 2020-12-05 DIAGNOSIS — Z9189 Other specified personal risk factors, not elsewhere classified: Secondary | ICD-10-CM

## 2020-12-05 DIAGNOSIS — I10 Essential (primary) hypertension: Secondary | ICD-10-CM

## 2020-12-05 DIAGNOSIS — R7303 Prediabetes: Secondary | ICD-10-CM | POA: Diagnosis not present

## 2020-12-05 DIAGNOSIS — F411 Generalized anxiety disorder: Secondary | ICD-10-CM

## 2020-12-05 DIAGNOSIS — E559 Vitamin D deficiency, unspecified: Secondary | ICD-10-CM | POA: Diagnosis not present

## 2020-12-05 DIAGNOSIS — Z6835 Body mass index (BMI) 35.0-35.9, adult: Secondary | ICD-10-CM

## 2020-12-05 MED ORDER — VITAMIN D (ERGOCALCIFEROL) 1.25 MG (50000 UNIT) PO CAPS: 50000.0000 [IU] | ORAL_CAPSULE | ORAL | 0 refills | Status: DC

## 2020-12-06 LAB — VITAMIN B12: Vitamin B-12: 1406 pg/mL — ABNORMAL HIGH (ref 232–1245)

## 2020-12-06 LAB — VITAMIN D 25 HYDROXY (VIT D DEFICIENCY, FRACTURES): Vit D, 25-Hydroxy: 65.3 ng/mL (ref 30.0–100.0)

## 2020-12-06 LAB — HEMOGLOBIN A1C
Est. average glucose Bld gHb Est-mCnc: 117 mg/dL
Hgb A1c MFr Bld: 5.7 % — ABNORMAL HIGH (ref 4.8–5.6)

## 2020-12-11 NOTE — Progress Notes (Signed)
Chief Complaint:   OBESITY Madison Delgado is here to discuss her progress with her obesity treatment plan along with follow-up of her obesity related diagnoses. Madison Delgado is on the Category 2 Plan and states she is following her eating plan approximately 40% of the time. Madison Delgado states she is walking 25 minutes 4 times per week.  Today's visit was #: 7 Starting weight: 199 lbs Starting date: 08/03/2020 Today's weight: 190 lbs Today's date: 12/05/2020 Total lbs lost to date: 9 Total lbs lost since last in-office visit: 1  Interim History: Madison Delgado lost a pound despite celebrating her birthday. She has been increasing exercise, as she realizes she needs consistency and discipline. She was recently seen bu her PCP and was kept on olmesartan and atenolol.  Subjective:   1. Essential hypertension Pt has some orthostatic hypotensive symptoms lasting only seconds. She is taking olmesartan and atenolol.  BP Readings from Last 3 Encounters:  12/05/20 114/77  11/10/20 108/71  10/19/20 100/63   Lab Results  Component Value Date   CREATININE 0.79 08/03/2020   CREATININE 0.96 03/28/2020   CREATININE 0.91 03/15/2018   2. Vitamin D deficiency She is currently taking prescription vitamin D 50,000 IU each week. She denies nausea, vomiting or muscle weakness.  Lab Results  Component Value Date   VD25OH 65.3 12/05/2020   VD25OH 34.3 08/03/2020   3. GAD (generalized anxiety disorder) Started back on November 2021. Madison Delgado's symptoms are well controlled with Desvenlafaxine.  4. B12 deficiency Madison Delgado cut OTC B12 supplement in half after last set of labs approximately 3-4 months ago. She denies concerns.   Lab Results  Component Value Date   VITAMINB12 1,406 (H) 12/05/2020   5. Pre-diabetes Madison Delgado has a diagnosis of prediabetes based on her elevated HgA1c and was informed this puts her at greater risk of developing diabetes. She continues to work on diet and exercise to decrease her risk of  diabetes. She denies nausea or hypoglycemia. She is not on medication.  Lab Results  Component Value Date   HGBA1C 5.7 (H) 12/05/2020   Lab Results  Component Value Date   INSULIN 9.2 08/03/2020   6. At risk for diabetes mellitus Madison Delgado is at higher than average risk for developing diabetes due to obesity.   Assessment/Plan:   Orders Placed This Encounter  Procedures   VITAMIN D 25 Hydroxy (Vit-D Deficiency, Fractures)   Vitamin B12   Hemoglobin A1c    Medications Discontinued During This Encounter  Medication Reason   Vitamin D, Ergocalciferol, (DRISDOL) 1.25 MG (50000 UNIT) CAPS capsule Reorder   Cyanocobalamin (VITAMIN B12) 500 MCG TABS Error     Meds ordered this encounter  Medications   Vitamin D, Ergocalciferol, (DRISDOL) 1.25 MG (50000 UNIT) CAPS capsule    Sig: Take 1 capsule (50,000 Units total) by mouth every 7 (seven) days.    Dispense:  4 capsule    Refill:  0    Ov for RF     1. Essential hypertension Pt's BP is stable. Her PCP refilled her medication at 20 mg tab and advised pt if she experiences dizziness, she may cut tablet in half. Madison Delgado is working on healthy weight loss and exercise to improve blood pressure control. We will watch for signs of hypotension as she continues her lifestyle modifications.  2. Vitamin D deficiency Low Vitamin D level contributes to fatigue and are associated with obesity, breast, and colon cancer. She agrees to continue to take prescription Vitamin D '@50'$ ,000 IU every week  and will follow-up for routine testing of Vitamin D, at least 2-3 times per year to avoid over-replacement. Check labs today.  Refill- Vitamin D, Ergocalciferol, (DRISDOL) 1.25 MG (50000 UNIT) CAPS capsule; Take 1 capsule (50,000 Units total) by mouth every 7 (seven) days.  Dispense: 4 capsule; Refill: 0  - VITAMIN D 25 Hydroxy (Vit-D Deficiency, Fractures)  3. GAD (generalized anxiety disorder) Behavior modification techniques were discussed today to  help Madison Delgado deal with her anxiety.  Orders and follow up as documented in patient record.   4. B12 deficiency Continue OTC B12 supplement at 1/2 dose. The diagnosis was reviewed with the patient. Counseling provided today, see below. We will continue to monitor. Orders and follow up as documented in patient record.  Counseling The body needs vitamin B12: to make red blood cells; to make DNA; and to help the nerves work properly so they can carry messages from the brain to the body.  The main causes of vitamin B12 deficiency include dietary deficiency, digestive diseases, pernicious anemia, and having a surgery in which part of the stomach or small intestine is removed.  Certain medicines can make it harder for the body to absorb vitamin B12. These medicines include: heartburn medications; some antibiotics; some medications used to treat diabetes, gout, and high cholesterol.  In some cases, there are no symptoms of this condition. If the condition leads to anemia or nerve damage, various symptoms can occur, such as weakness or fatigue, shortness of breath, and numbness or tingling in your hands and feet.   Treatment:  May include taking vitamin B12 supplements.  Avoid alcohol.  Eat lots of healthy foods that contain vitamin B12: Beef, pork, chicken, [place], and organ meats, such as liver.  Seafood: This includes clams, rainbow trout, salmon, tuna, and haddock. Eggs.  Cereal and dairy products that are fortified: This means that vitamin B12 has been added to the food.  Check labs today.  - Vitamin B12  5. Pre-diabetes Madison Delgado has no desire for medication now. She wishes to continue to work on weight loss, exercise, and decreasing simple carbohydrates to help decrease the risk of diabetes.  Check labs today.  - Hemoglobin A1c  6. At risk for diabetes mellitus Madison Delgado was given approximately 12 minutes of diabetes education and counseling today. We discussed intensive lifestyle modifications  today with an emphasis on weight loss as well as increasing exercise and decreasing simple carbohydrates in her diet. We also reviewed medication options with an emphasis on risk versus benefit of those discussed.   Repetitive spaced learning was employed today to elicit superior memory formation and behavioral change.  7. Obesity with current BMI of 33.8  Madison Delgado is currently in the action stage of change. As such, her goal is to continue with weight loss efforts. She has agreed to the Category 2 Plan.   Exercise goals:  As is  Behavioral modification strategies: increasing lean protein intake, decreasing simple carbohydrates, and increasing water intake.  Madison Delgado has agreed to follow-up with our clinic in 3 weeks. She was informed of the importance of frequent follow-up visits to maximize her success with intensive lifestyle modifications for her multiple health conditions.   Madison Delgado was informed we would discuss her lab results at her next visit unless there is a critical issue that needs to be addressed sooner. Madison Delgado agreed to keep her next visit at the agreed upon time to discuss these results.  Objective:   Blood pressure 114/77, pulse 67, temperature 98.2 F (36.8 C),  height '5\' 3"'$  (1.6 m), weight 190 lb (86.2 kg), SpO2 97 %. Body mass index is 33.66 kg/m.  General: Cooperative, alert, well developed, in no acute distress. HEENT: Conjunctivae and lids unremarkable. Cardiovascular: Regular rhythm.  Lungs: Normal work of breathing. Neurologic: No focal deficits.   Lab Results  Component Value Date   CREATININE 0.79 08/03/2020   BUN 18 08/03/2020   NA 142 08/03/2020   K 4.5 08/03/2020   CL 103 08/03/2020   CO2 21 08/03/2020   Lab Results  Component Value Date   ALT 27 08/03/2020   AST 22 08/03/2020   ALKPHOS 52 08/03/2020   BILITOT 0.2 08/03/2020   Lab Results  Component Value Date   HGBA1C 5.7 (H) 12/05/2020   HGBA1C 6.1 (H) 08/03/2020   Lab Results  Component  Value Date   INSULIN 9.2 08/03/2020   Lab Results  Component Value Date   TSH 0.540 08/03/2020   Lab Results  Component Value Date   CHOL 163 08/03/2020   HDL 54 08/03/2020   LDLCALC 90 08/03/2020   TRIG 103 08/03/2020   CHOLHDL 3.0 08/03/2020   Lab Results  Component Value Date   VD25OH 65.3 12/05/2020   VD25OH 34.3 08/03/2020   Lab Results  Component Value Date   WBC 4.6 08/03/2020   HGB 13.3 08/03/2020   HCT 41.1 08/03/2020   MCV 83 08/03/2020   PLT 275 08/03/2020    Attestation Statements:   Reviewed by clinician on day of visit: allergies, medications, problem list, medical history, surgical history, family history, social history, and previous encounter notes.  Coral Ceo, CMA, am acting as transcriptionist for [employer], DO.  I have reviewed the above documentation for accuracy and completeness, and I agree with the above. Marjory Sneddon, D.O.  The [place] was signed into law in 2016 which includes the topic of electronic health records.  This provides immediate access to information in MyChart.  This includes consultation notes, operative notes, office notes, lab results and pathology reports.  If you have any questions about what you read please let us know at your next visit so we can discuss your concerns and take corrective action if need be.  We are right here with you.

## 2020-12-26 ENCOUNTER — Ambulatory Visit (INDEPENDENT_AMBULATORY_CARE_PROVIDER_SITE_OTHER): Payer: BC Managed Care – PPO | Admitting: Family Medicine

## 2020-12-26 ENCOUNTER — Other Ambulatory Visit: Payer: Self-pay

## 2020-12-26 ENCOUNTER — Encounter (INDEPENDENT_AMBULATORY_CARE_PROVIDER_SITE_OTHER): Payer: Self-pay | Admitting: Family Medicine

## 2020-12-26 VITALS — BP 108/69 | HR 67 | Temp 98.6°F | Ht 63.0 in | Wt 193.0 lb

## 2020-12-26 DIAGNOSIS — Z9189 Other specified personal risk factors, not elsewhere classified: Secondary | ICD-10-CM | POA: Diagnosis not present

## 2020-12-26 DIAGNOSIS — Z6835 Body mass index (BMI) 35.0-35.9, adult: Secondary | ICD-10-CM

## 2020-12-26 DIAGNOSIS — R7303 Prediabetes: Secondary | ICD-10-CM | POA: Diagnosis not present

## 2020-12-26 DIAGNOSIS — E66812 Obesity, class 2: Secondary | ICD-10-CM

## 2020-12-26 DIAGNOSIS — E559 Vitamin D deficiency, unspecified: Secondary | ICD-10-CM | POA: Diagnosis not present

## 2020-12-26 DIAGNOSIS — I10 Essential (primary) hypertension: Secondary | ICD-10-CM

## 2020-12-26 DIAGNOSIS — E538 Deficiency of other specified B group vitamins: Secondary | ICD-10-CM

## 2020-12-26 MED ORDER — OLMESARTAN MEDOXOMIL 20 MG PO TABS: 10.0000 mg | ORAL_TABLET | Freq: Every day | ORAL | Status: DC

## 2020-12-26 MED ORDER — VITAMIN D (ERGOCALCIFEROL) 1.25 MG (50000 UNIT) PO CAPS: 50000.0000 [IU] | ORAL_CAPSULE | ORAL | 0 refills | Status: DC

## 2020-12-27 NOTE — Progress Notes (Signed)
Chief Complaint:   OBESITY Madison Delgado is here to discuss her progress with her obesity treatment plan along with follow-up of her obesity related diagnoses. Madison Delgado is on the Category 2 Plan and states she is following her eating plan approximately 20% of the time. Madison Delgado states she is walking for 25 minutes 3-4 times per week.  Today's visit was #: 8 Starting weight: 199 lbs Starting date: 08/03/2020 Today's weight: 193 lbs Today's date: 12/26/2020 Total lbs lost to date: 6 Total lbs lost since last in-office visit: 0  Interim History: Emmi went on 2-3 vacations and she ate and drank what she wanted. She notes she feels bad, but she is ready to get back at it.  Subjective:   1. Pre-diabetes Madison Delgado's A1c has improved from 6.1 to 5.7. She denies significant carbohydrate cravings and she declines medications to help. I discussed labs with the patient today.  2. Vitamin D deficiency Madison Delgado's Vit D level has improved and is now at goal. She denies signs of change in energy levels or mood. I discussed labs with the patient today.  3. B12 deficiency Madison Delgado's B12 level improved since and helps with her vitiligo. She decreased to 1/2 dose after her last lab results. I discussed labs with the patient today.  4. Essential hypertension Madison Delgado is taking omelsartan, and she notes decreasing dizziness.  5. At risk for malnutrition Madison Delgado is at increased risk for malnutrition due to inadequate food intake. Madison Delgado was given extensive malnutrition prevention education and counseling today of more than 9 minutes.  Counseled her that malnutrition refers to inappropriate nutrients or not the right balance of nutrients for optimal health.  Discussed with Madison Delgado that it is absolutely possible to be malnourished but yet obese.  Risk factors, including but not limited to, inappropriate dietary choices, difficulty with obtaining food due to physical or financial limitations, and various  physical and mental health conditions were reviewed with Madison Delgado.    Assessment/Plan:  No orders of the defined types were placed in this encounter.   Medications Discontinued During This Encounter  Medication Reason   olmesartan (BENICAR) 40 MG tablet    Vitamin D, Ergocalciferol, (DRISDOL) 1.25 MG (50000 UNIT) CAPS capsule Reorder     Meds ordered this encounter  Medications   Vitamin D, Ergocalciferol, (DRISDOL) 1.25 MG (50000 UNIT) CAPS capsule    Sig: Take 1 capsule (50,000 Units total) by mouth every 7 (seven) days.    Dispense:  4 capsule    Refill:  0    Ov for RF   olmesartan (BENICAR) 20 MG tablet    Sig: Take 0.5 tablets (10 mg total) by mouth daily.     1. Pre-diabetes Madison Delgado will continue her prudent nutritional plan, weight loss, and decreasing simple carbohydrates to help decrease the risk of diabetes.   2. Vitamin D deficiency Low Vitamin D level contributes to fatigue and are associated with obesity, breast, and colon cancer. We will refill prescription Vitamin D for 1 month. Madison Delgado will follow-up for routine testing of Vitamin D, at least 2-3 times per year to avoid over-replacement.  - Vitamin D, Ergocalciferol, (DRISDOL) 1.25 MG (50000 UNIT) CAPS capsule; Take 1 capsule (50,000 Units total) by mouth every 7 (seven) days.  Dispense: 4 capsule; Refill: 0  3. B12 deficiency The diagnosis was reviewed with the patient. Madison Delgado will continue her current dose. Counseling provided today, see below. We will continue to monitor. Orders and follow up as documented in  patient record.  Counseling The body needs vitamin B12: to make red blood cells; to make DNA; and to help the nerves work properly so they can carry messages from the brain to the body.  The main causes of vitamin B12 deficiency include dietary deficiency, digestive diseases, pernicious anemia, and having a surgery in which part of the stomach or small intestine is removed.  Certain medicines  can make it harder for the body to absorb vitamin B12. These medicines include: heartburn medications; some antibiotics; some medications used to treat diabetes, gout, and high cholesterol.  In some cases, there are no symptoms of this condition. If the condition leads to anemia or nerve damage, various symptoms can occur, such as weakness or fatigue, shortness of breath, and numbness or tingling in your hands and feet.   Treatment:  May include taking vitamin B12 supplements.  Avoid alcohol.  Eat lots of healthy foods that contain vitamin B12: Beef, pork, chicken, [place], and organ meats, such as liver.  Seafood: This includes clams, rainbow trout, salmon, tuna, and haddock. Eggs.  Cereal and dairy products that are fortified: This means that vitamin B12 has been added to the food.   4. Essential hypertension Madison Delgado agreed to decrease olmesartan from 20 mg to 10 mg daily with no refills. working on healthy weight loss and exercise to improve blood pressure control. She is to continue home blood pressure monitoring.  - olmesartan (BENICAR) 20 MG tablet; Take 0.5 tablets (10 mg total) by mouth daily.  5. At risk for malnutrition Madison Delgado was given approximately 15 minutes of counseling today regarding prevention of malnutrition and ways to meet macronutrient goals..    6. Obesity with current BMI of 34.3 Madison Delgado is currently in the action stage of change. As such, her goal is to continue with weight loss efforts. She has agreed to the Category 2 Plan.   Exercise goals: As is, and will increase her goal to walking 30 minutes 5 days per week.  Behavioral modification strategies: increasing lean protein intake, decreasing simple carbohydrates, keeping healthy foods in the home, and planning for success.  Madison Delgado has agreed to follow-up with our clinic in 3 weeks. She was informed of the importance of frequent follow-up visits to maximize her success with intensive lifestyle modifications for her  multiple health conditions.   Objective:   Blood pressure 108/69, pulse 67, temperature 98.6 F (37 C), height '5\' 3"'$  (1.6 m), weight 193 lb (87.5 kg), SpO2 100 %. Body mass index is 34.19 kg/m.  General: Cooperative, alert, well developed, in no acute distress. HEENT: Conjunctivae and lids unremarkable. Cardiovascular: Regular rhythm.  Lungs: Normal work of breathing. Neurologic: No focal deficits.   Lab Results  Component Value Date   CREATININE 0.79 08/03/2020   BUN 18 08/03/2020   NA 142 08/03/2020   K 4.5 08/03/2020   CL 103 08/03/2020   CO2 21 08/03/2020   Lab Results  Component Value Date   ALT 27 08/03/2020   AST 22 08/03/2020   ALKPHOS 52 08/03/2020   BILITOT 0.2 08/03/2020   Lab Results  Component Value Date   HGBA1C 5.7 (H) 12/05/2020   HGBA1C 6.1 (H) 08/03/2020   Lab Results  Component Value Date   INSULIN 9.2 08/03/2020   Lab Results  Component Value Date   TSH 0.540 08/03/2020   Lab Results  Component Value Date   CHOL 163 08/03/2020   HDL 54 08/03/2020   LDLCALC 90 08/03/2020   TRIG 103 08/03/2020  CHOLHDL 3.0 08/03/2020   Lab Results  Component Value Date   VD25OH 65.3 12/05/2020   VD25OH 34.3 08/03/2020   Lab Results  Component Value Date   WBC 4.6 08/03/2020   HGB 13.3 08/03/2020   HCT 41.1 08/03/2020   MCV 83 08/03/2020   PLT 275 08/03/2020   No results found for: IRON, TIBC, FERRITIN  Attestation Statements:   Reviewed by clinician on day of visit: allergies, medications, problem list, medical history, surgical history, family history, social history, and previous encounter notes.   Wilhemena Durie, am acting as transcriptionist for [employer], DO.  I have reviewed the above documentation for accuracy and completeness, and I agree with the above. Marjory Sneddon, D.O.  The [place] was signed into law in 2016 which includes the topic of electronic health records.  This provides immediate access to  information in MyChart.  This includes consultation notes, operative notes, office notes, lab results and pathology reports.  If you have any questions about what you read please let us know at your next visit so we can discuss your concerns and take corrective action if need be.  We are right here with you.

## 2021-01-26 ENCOUNTER — Ambulatory Visit (INDEPENDENT_AMBULATORY_CARE_PROVIDER_SITE_OTHER): Payer: BC Managed Care – PPO | Admitting: Family Medicine

## 2021-02-07 ENCOUNTER — Encounter (INDEPENDENT_AMBULATORY_CARE_PROVIDER_SITE_OTHER): Payer: Self-pay

## 2021-02-09 ENCOUNTER — Encounter (INDEPENDENT_AMBULATORY_CARE_PROVIDER_SITE_OTHER): Payer: Self-pay | Admitting: Family Medicine

## 2021-02-09 ENCOUNTER — Other Ambulatory Visit: Payer: Self-pay

## 2021-02-09 ENCOUNTER — Ambulatory Visit (INDEPENDENT_AMBULATORY_CARE_PROVIDER_SITE_OTHER): Payer: BC Managed Care – PPO | Admitting: Family Medicine

## 2021-02-09 VITALS — BP 121/79 | HR 72 | Temp 98.4°F | Ht 63.0 in | Wt 194.0 lb

## 2021-02-09 DIAGNOSIS — F5089 Other specified eating disorder: Secondary | ICD-10-CM | POA: Diagnosis not present

## 2021-02-09 DIAGNOSIS — I10 Essential (primary) hypertension: Secondary | ICD-10-CM

## 2021-02-09 DIAGNOSIS — Z6835 Body mass index (BMI) 35.0-35.9, adult: Secondary | ICD-10-CM

## 2021-02-09 DIAGNOSIS — Z9189 Other specified personal risk factors, not elsewhere classified: Secondary | ICD-10-CM

## 2021-02-09 DIAGNOSIS — E559 Vitamin D deficiency, unspecified: Secondary | ICD-10-CM

## 2021-02-09 MED ORDER — OLMESARTAN MEDOXOMIL 5 MG PO TABS: 10.0000 mg | ORAL_TABLET | Freq: Every day | ORAL | 0 refills | Status: DC

## 2021-02-09 MED ORDER — VITAMIN D (ERGOCALCIFEROL) 1.25 MG (50000 UNIT) PO CAPS: 50000.0000 [IU] | ORAL_CAPSULE | ORAL | 0 refills | Status: DC

## 2021-02-10 NOTE — Progress Notes (Addendum)
Chief Complaint:   OBESITY Madison Delgado is here to discuss her progress with her obesity treatment plan along with follow-up of her obesity related diagnoses. Madison Delgado is on the Category 2 Plan and states she is following her eating plan approximately 20% of the time. Madison Delgado states she is doing cardio video exercises 15-30 minutes 7 times per week.  Today's visit was #: 9 Starting weight: 199 lbs Starting date: 08/03/2020 Today's weight: 194 lbs Today's date: 02/09/2021 Total lbs lost to date: 5 Total lbs lost since last in-office visit: +1  Interim History: Madison Delgado's last OV was 12/26/2020. She has been exercising every morning for 2 weeks and she is proud of herself, however she is not eating on plan much.   She is here for a follow up office visit.  We reviewed her meal plan and questions were answered.  Patient's food recall appears to be accurate and consistent with what is on plan when she is following it.   When eating on plan, her hunger and cravings are well controlled.      Subjective:   1. Essential hypertension Madison Delgado is no longer dizzy after we cut her Benicar in half last OV. She is tolerating it well. Her BP at home is 110-120/70's. Her PCP agreed with our management.  2. Other disorder of eating, with emotional eating Madison Delgado is struggling with emotional eating and using food for comfort to the extent that it is negatively impacting her health. She has been working on behavior modification techniques to help reduce her emotional eating. She shows no sign of suicidal or homicidal ideations.  3. Vitamin D deficiency She is currently taking prescription vitamin D 50,000 IU each week. She denies nausea, vomiting or muscle weakness.  Lab Results  Component Value Date   VD25OH 65.3 12/05/2020   VD25OH 34.3 08/03/2020   4. At risk for complication associated with hypotension The patient is at a higher than average risk of hypotension due to weight loss and history of  hypertension.  Assessment/Plan:   Medications Discontinued During This Encounter  Medication Reason   olmesartan (BENICAR) 20 MG tablet Not available   Vitamin D, Ergocalciferol, (DRISDOL) 1.25 MG (50000 UNIT) CAPS capsule Reorder     Meds ordered this encounter  Medications   Vitamin D, Ergocalciferol, (DRISDOL) 1.25 MG (50000 UNIT) CAPS capsule    Sig: Take 1 capsule (50,000 Units total) by mouth every 7 (seven) days.    Dispense:  4 capsule    Refill:  0    Ov for RF   olmesartan (BENICAR) 5 MG tablet    Sig: Take 2 tablets (10 mg total) by mouth daily.    Dispense:  60 tablet    Refill:  0     1. Essential hypertension Madison Delgado is working on healthy weight loss and exercise to improve blood pressure control. We will watch for signs of hypotension as she continues her lifestyle modifications. Continue Benicar as directed.  Refill- olmesartan (BENICAR) 5 MG tablet; Take 2 tablets (10 mg total) by mouth daily.  Dispense: 60 tablet; Refill: 0  2. Other disorder of eating, with emotional eating Behavior modification techniques were discussed today to help Madison Delgado deal with her emotional/non-hunger eating behaviors.  Orders and follow up as documented in patient record. Refer to Dr. Mallie Mussel.  3. Vitamin D deficiency Low Vitamin D level contributes to fatigue and are associated with obesity, breast, and colon cancer. She agrees to continue to take prescription Vitamin D 50,000 IU  every week and will follow-up for routine testing of Vitamin D, at least 2-3 times per year to avoid over-replacement.  Levels at goal.   Refill- Vitamin D, Ergocalciferol, (DRISDOL) 1.25 MG (50000 UNIT) CAPS capsule; Take 1 capsule (50,000 Units total) by mouth every 7 (seven) days.  Dispense: 4 capsule; Refill: 0  4. At risk for complication associated with hypotension Madison Delgado was given approximately 9 minutes of education and counseling today regarding the condition of hypotension, the pathophysiology of  the condition and concerns regarding prevention and treatment of this condition.  We discussed risks of medications used to treat hypertension, which in the setting of weight loss, can inadvertently cause hypotension.  Signs of hypotension such as feeling lightheaded or unsteady, esp when getting up first thing in the AM or off the toilet, were reviewed in detail and all questions were answered.  The use of motivational interviewing was employed as a technique as well today to aid in the treatment of patient's multiple conditions.  Pt understands importance of proper hydration and also of more prudent home BP monitoring while actively losing weight.  she will call us, or their PCP,  or other specialists who treat their BP with medications, with any questions or concerns that may develop.    5. Obesity with current BMI of 34.4  Madison Delgado is currently in the action stage of change. As such, her goal is to continue with weight loss efforts. She has agreed to the Category 2 Plan.   Exercise goals:  as is and increase as tolerated.  Behavioral modification strategies: increasing lean protein intake, no skipping meals, and planning for success.  Madison Delgado has agreed to follow-up with our clinic in 3 weeks. She was informed of the importance of frequent follow-up visits to maximize her success with intensive lifestyle modifications for her multiple health conditions.   Objective:   Blood pressure 121/79, pulse 72, temperature 98.4 F (36.9 C), height 5\' 3"  (1.6 m), weight 194 lb (88 kg), SpO2 100 %. Body mass index is 34.37 kg/m.  General: Cooperative, alert, well developed, in no acute distress. HEENT: Conjunctivae and lids unremarkable. Cardiovascular: Regular rhythm.  Lungs: Normal work of breathing. Neurologic: No focal deficits.   Lab Results  Component Value Date   CREATININE 0.79 08/03/2020   BUN 18 08/03/2020   NA 142 08/03/2020   K 4.5 08/03/2020   CL 103 08/03/2020   CO2 21 08/03/2020    Lab Results  Component Value Date   ALT 27 08/03/2020   AST 22 08/03/2020   ALKPHOS 52 08/03/2020   BILITOT 0.2 08/03/2020   Lab Results  Component Value Date   HGBA1C 5.7 (H) 12/05/2020   HGBA1C 6.1 (H) 08/03/2020   Lab Results  Component Value Date   INSULIN 9.2 08/03/2020   Lab Results  Component Value Date   TSH 0.540 08/03/2020   Lab Results  Component Value Date   CHOL 163 08/03/2020   HDL 54 08/03/2020   LDLCALC 90 08/03/2020   TRIG 103 08/03/2020   CHOLHDL 3.0 08/03/2020   Lab Results  Component Value Date   VD25OH 65.3 12/05/2020   VD25OH 34.3 08/03/2020   Lab Results  Component Value Date   WBC 4.6 08/03/2020   HGB 13.3 08/03/2020   HCT 41.1 08/03/2020   MCV 83 08/03/2020   PLT 275 08/03/2020   Attestation Statements:   Reviewed by clinician on day of visit: allergies, medications, problem list, medical history, surgical history, family history, social history,  and previous encounter notes.  Coral Ceo, CMA, am acting as transcriptionist for [employer], DO.  I have reviewed the above documentation for accuracy and completeness, and I agree with the above. Marjory Sneddon, D.O.  The [place] was signed into law in 2016 which includes the topic of electronic health records.  This provides immediate access to information in MyChart.  This includes consultation notes, operative notes, office notes, lab results and pathology reports.  If you have any questions about what you read please let us know at your next visit so we can discuss your concerns and take corrective action if need be.  We are right here with you.

## 2021-02-14 ENCOUNTER — Other Ambulatory Visit: Payer: Self-pay | Admitting: Internal Medicine

## 2021-02-14 DIAGNOSIS — Z1231 Encounter for screening mammogram for malignant neoplasm of breast: Secondary | ICD-10-CM

## 2021-02-21 NOTE — Progress Notes (Unsigned)
Office: [phone]  /  Fax: [phone]    Date: March 07, 2021   Appointment Start Time: *** Duration: *** minutes Provider: Glennie Isle, Psy.D. Type of Session: Intake for Individual Therapy  Location of Patient: {gbptloc:23249} Location of Provider: Provider's home (private office) Type of Contact: Telepsychological Visit via MyChart Video Visit  Informed Consent: Prior to proceeding with today's appointment, two pieces of identifying information were obtained. In addition, Madison Delgado's physical location at the time of this appointment was obtained as well a phone number she could be reached at in the event of technical difficulties. [name] and this provider participated in today's telepsychological service.   The provider's role was explained to Madison Delgado. The provider reviewed and discussed issues of confidentiality, privacy, and limits therein (e.g., reporting obligations). In addition to verbal informed consent, written informed consent for psychological services was obtained prior to the initial appointment. Since the clinic is not a 24/7 crisis center, mental health emergency resources were shared and this  provider explained MyChart, e-mail, voicemail, and/or other messaging systems should be utilized only for non-emergency reasons. This provider also explained that information obtained during appointments will be placed in Madison Delgado's medical record and relevant information will be shared with other providers at [facility] for coordination of care. [name] agreed information may be shared with other [facility] providers as needed for coordination of care and by signing the service agreement document, she provided written consent for coordination of care. Prior to initiating telepsychological services, [name] completed an informed consent document, which included the development of a safety plan (i.e., an emergency contact and emergency resources)  in the event of an emergency/crisis. [name] verbally acknowledged understanding she is ultimately responsible for understanding her insurance benefits for telepsychological and in-person services. This provider also reviewed confidentiality, as it relates to telepsychological services, as well as the rationale for telepsychological services (i.e., to reduce exposure risk to COVID-19). [name]  acknowledged understanding that appointments cannot be recorded without both party consent and she is aware she is responsible for securing confidentiality on her end of the session. [name] verbally consented to proceed.  Chief Complaint/HPI: Madison Delgado was referred by [name] due to  other disorder of eating, emotional eating . Per the note for the visit with [name] on February 09, 2021, "Madison Delgado is struggling with emotional eating and using food for comfort to the extent that it is negatively impacting her health. She has been working on behavior modification techniques to help reduce her emotional eating. She shows no sign of suicidal or homicidal ideations." The note for the initial appointment with [name] on August 03, 2020 indicated the following: "she thinks her family will eat healthier with her, her desired weight loss is 35 pounds, she started gaining weight 8 years ago, her heaviest weight ever was 205 pounds, she craves fried foods (chicken), she frequently makes poor food choices, she frequently eats larger portions than normal and she struggles with emotional eating." Madison Delgado's Food and Mood (modified PHQ-9) score on August 03, 2020 was 6.  During today's appointment, Madison Delgado was verbally administered a questionnaire assessing various behaviors related to emotional eating behaviors. Madison Delgado endorsed the following: {gbmoodandfood:21755}. She shared she craves ***. Madison Delgado believes the onset of emotional eating behaviors was *** and described the current frequency of  emotional eating behaviors as ***. In addition, Madison Delgado {gblegal:22371} a history of binge eating behaviors. *** Currently, Madison Delgado indicated *** triggers emotional eating behaviors, whereas ***  makes emotional eating behaviors better. Furthermore, Madison Delgado {gblegal:22371} other problems of concern. ***   Mental Status Examination:  Appearance: {Appearance:22431} Behavior: {Behavior:22445} Mood: {gbmood:21757} Affect: {Affect:22436} Speech: {Speech:22432} Eye Contact: {Eye Contact:22433} Psychomotor Activity: unable to assess Gait: unable to assess  Thought Process: {thought process:22448}  Thought Content/Perception: {disturbances:22451} Orientation: {Orientation:22437} Memory/Concentration: {gbcognition:22449} Insight/Judgment: {Insight:22446}  Family & Psychosocial History: Madison Delgado reported she is *** and ***. She indicated she is currently ***. Additionally, Madison Delgado shared her highest level of education obtained is ***. Currently, [name]'s social support system consists of her ***. Moreover, Madison Delgado stated she resides with her ***.   Medical History: ***  Mental Health History: Toyoko reported ***. She {gblegal:22371} a history of psychotropic medications. Madison Delgado {Endorse or deny of item:23407} hospitalizations for psychiatric concerns. Madison Delgado {gblegal:22371} a family history of mental health related concerns. *** Madison Delgado {Endorse or deny of item:23407} trauma including {gbtrauma:22071} abuse, as well as neglect. ***  Madison Delgado described her typical mood lately as ***. Aside from concerns noted above and endorsed on the PHQ-9 and GAD-7, Madison Delgado reported ***. Madison Delgado {gblegal:22371} current alcohol use. *** She {gblegal:22371} tobacco use. *** She {LHTDSKA:76811} illicit/recreational substance use. Regarding caffeine intake, Madison Delgado reported ***. Furthermore, Madison Delgado indicated she is not experiencing the following: {gbsxs:21965}. She also denied history of and current suicidal ideation,  plan, and intent; history of and current homicidal ideation, plan, and intent; and history of and current engagement in self-harm.  The following strengths were reported by Madison Delgado: ***. The following strengths were observed by this provider: ability to express thoughts and feelings during the therapeutic session, ability to establish and benefit from a therapeutic relationship, willingness to work toward established goal(s) with the clinic and ability to engage in reciprocal conversation. ***  Legal History: Kebrina {Endorse or deny of item:23407} legal involvement.   Structured Assessments Results: The Patient Health Questionnaire-9 (PHQ-9) is a self-report measure that assesses symptoms and severity of depression over the course of the last two weeks. Signe obtained a score of *** suggesting {GBPHQ9SEVERITY:21752}. Mathew finds the endorsed symptoms to be {gbphq9difficulty:21754}. [0= Not at all; 1= Several days; 2= More than half the days; 3= Nearly every day] Little interest or pleasure in doing things ***  Feeling down, depressed, or hopeless ***  Trouble falling or staying asleep, or sleeping too much ***  Feeling tired or having little energy ***  Poor appetite or overeating ***  Feeling bad about yourself --- or that you are a failure or have let yourself or your family down ***  Trouble concentrating on things, such as reading the newspaper or watching television ***  Moving or speaking so slowly that other people could have noticed? Or the opposite --- being so fidgety or restless that you have been moving around a lot more than usual ***  Thoughts that you would be better off dead or hurting yourself in some way ***  PHQ-9 Score ***    The Generalized Anxiety Disorder-7 (GAD-7) is a brief self-report measure that assesses symptoms of anxiety over the course of the last two weeks. Delinda obtained a score of *** suggesting {gbgad7severity:21753}. Taila finds the endorsed symptoms  to be {gbphq9difficulty:21754}. [0= Not at all; 1= Several days; 2= Over half the days; 3= Nearly every day] Feeling nervous, anxious, on edge ***  Not being able to stop or control worrying ***  Worrying too much about different things ***  Trouble relaxing ***  Being so restless that it's hard to sit still ***  Becoming easily annoyed  or irritable ***  Feeling afraid as if something awful might happen ***  GAD-7 Score ***   Interventions:  {Interventions List for Intake:23406}  Provisional DSM-5 Diagnosis(es): {Diagnoses:22752}  Plan: Aishani appears able and willing to participate as evidenced by collaboration on a treatment goal, engagement in reciprocal conversation, and asking questions as needed for clarification. The next appointment will be scheduled in {gbweeks:21758}, which will be {gbtxmodality:23402}. The following treatment goal was established: {gbtxgoals:21759}. This provider will regularly review the treatment plan and medical chart to keep informed of status changes. Ahmiyah expressed understanding and agreement with the initial treatment plan of care. *** Jazman will be sent a handout via e-mail to utilize between now and the next appointment to increase awareness of hunger patterns and subsequent eating. Rudolph provided verbal consent during today's appointment for this provider to send the handout via e-mail. ***

## 2021-02-28 IMAGING — DX DG CHEST 2V
2 series · 2 of 2 positions shown · non-contrast
Comparison: 03/15/2018

CLINICAL DATA: Palpitations.

EXAM:
CHEST - 2 VIEW

[w chest pa]
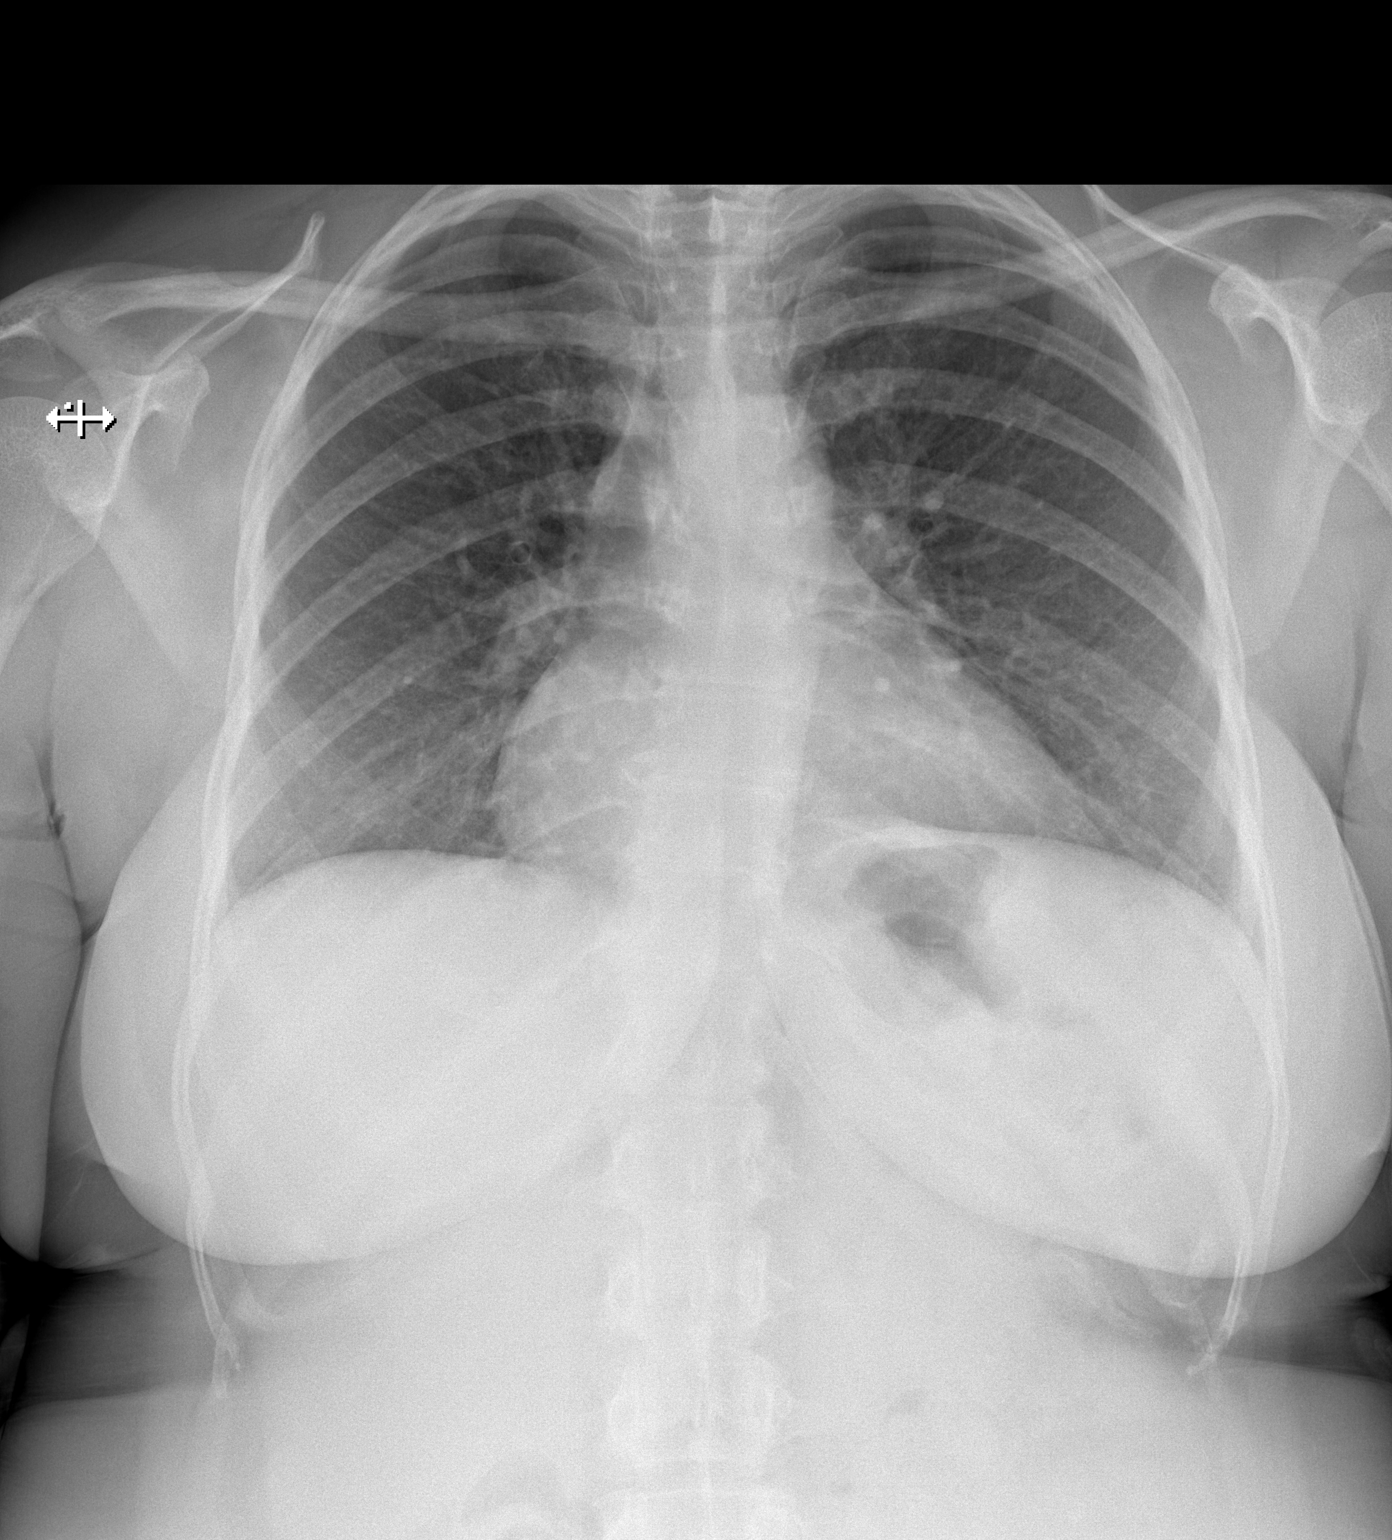

[w chest lat]
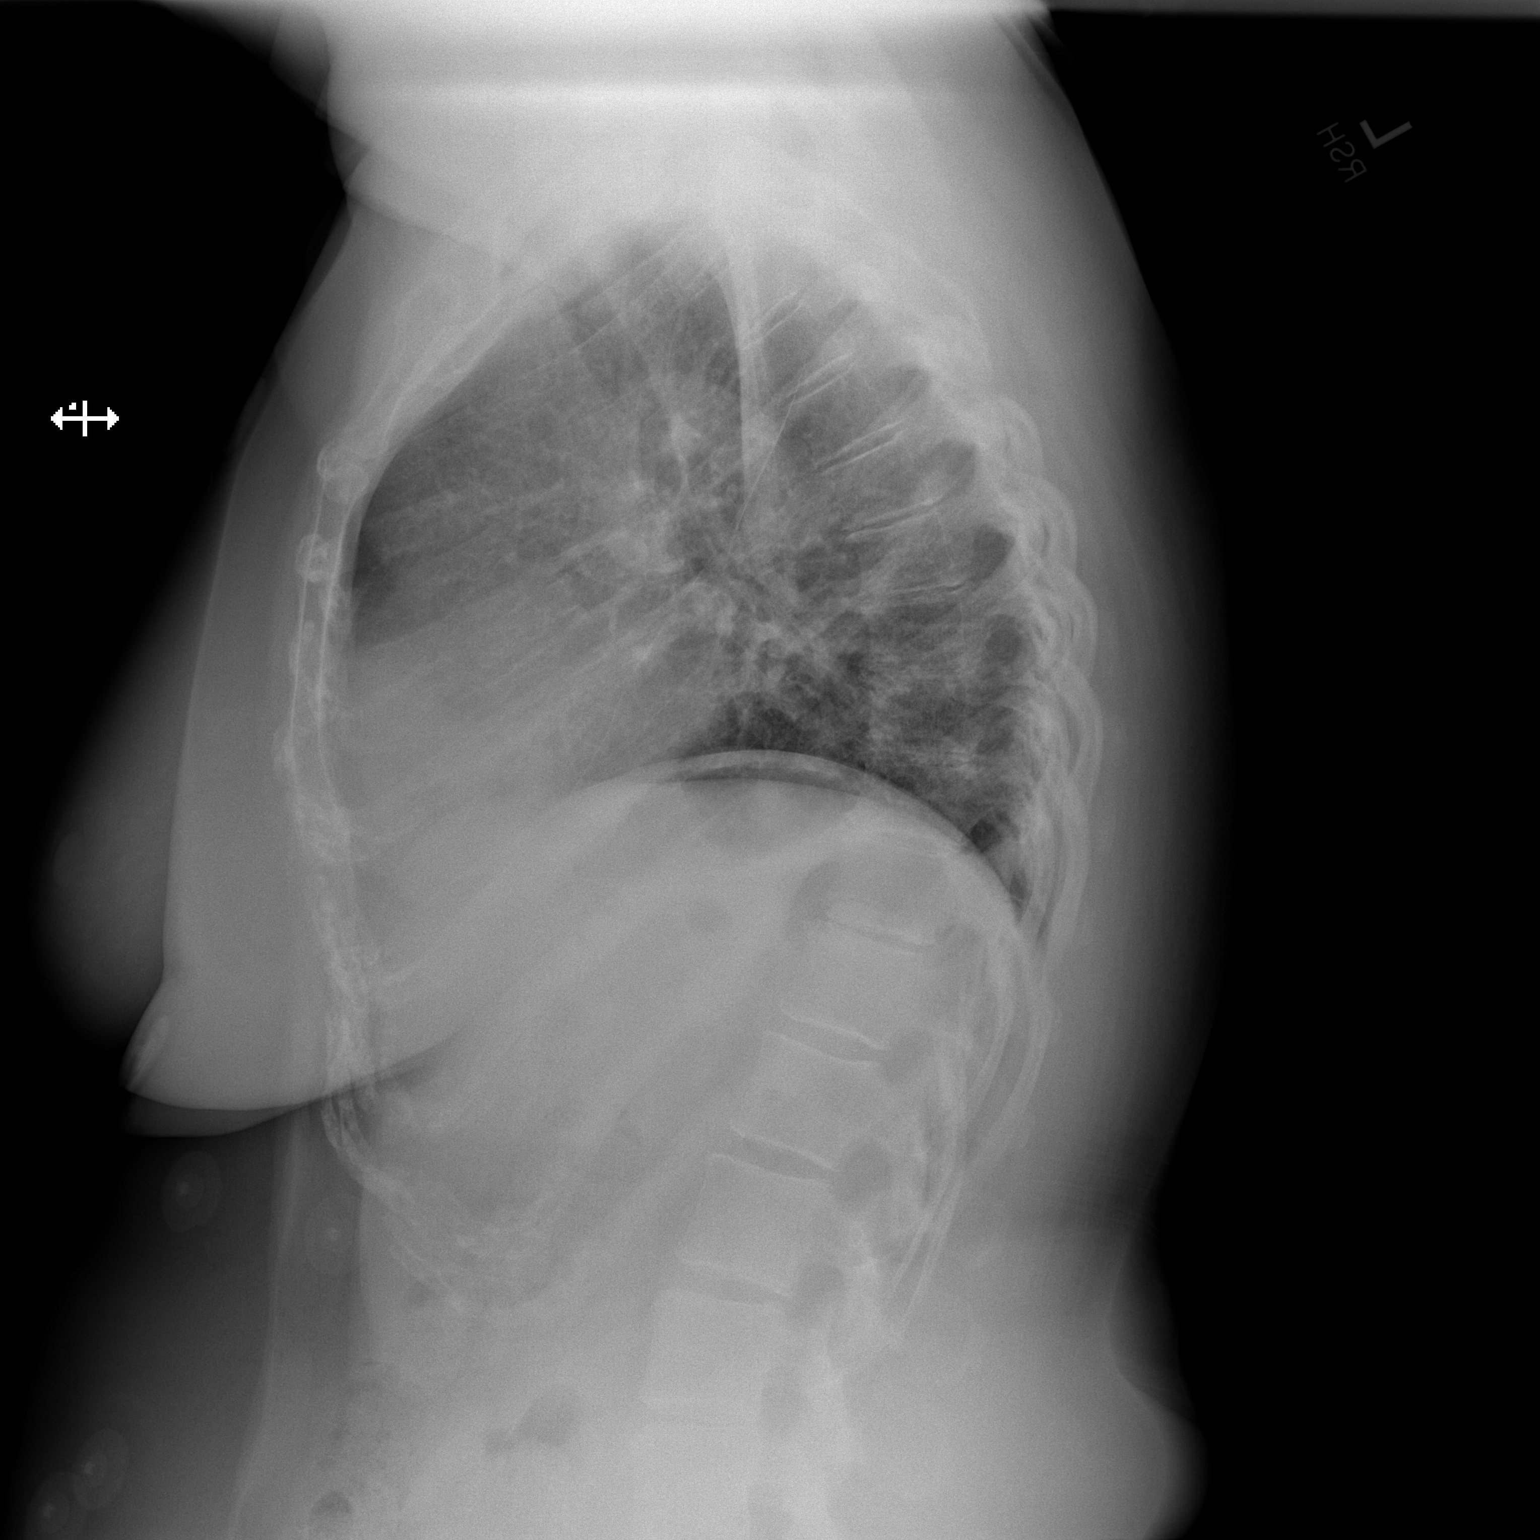

[2 of 2 positions shown; findings below may reference images not displayed]

FINDINGS: The lungs are clear without focal pneumonia, edema, pneumothorax or
pleural effusion. The cardiopericardial silhouette is within normal
limits for size. The visualized bony structures of the thorax show
no acute abnormality.
IMPRESSION: No active cardiopulmonary disease.

## 2021-03-07 ENCOUNTER — Encounter (INDEPENDENT_AMBULATORY_CARE_PROVIDER_SITE_OTHER): Payer: Self-pay

## 2021-03-07 ENCOUNTER — Telehealth (INDEPENDENT_AMBULATORY_CARE_PROVIDER_SITE_OTHER): Payer: BC Managed Care – PPO | Admitting: Psychology

## 2021-03-09 ENCOUNTER — Other Ambulatory Visit: Payer: Self-pay

## 2021-03-09 ENCOUNTER — Ambulatory Visit: Payer: BC Managed Care – PPO | Admitting: Podiatry

## 2021-03-09 ENCOUNTER — Encounter: Payer: Self-pay | Admitting: Podiatry

## 2021-03-09 DIAGNOSIS — M778 Other enthesopathies, not elsewhere classified: Secondary | ICD-10-CM

## 2021-03-09 MED ORDER — TRIAMCINOLONE ACETONIDE 10 MG/ML IJ SUSP
10.0000 mg | Freq: Once | INTRAMUSCULAR | Status: AC
  Administered 2021-03-09: 10 mg

## 2021-03-10 NOTE — Progress Notes (Signed)
Subjective:   Patient ID: [name], female   DOB: 60 y.o.   MRN: [id number]   HPI Patient states she has had a elevation in her discomfort of the lesser MPJ left over right and states that it has gotten worse over this past year.  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  At this point continue conservative care and I did do a forefoot block of the left foot I then did sterile prep and aspirated the third MPJ getting out a small amount of clear fluid injected quarter cc dexamethasone Kenalog and applied padding to take pressure off along with rigid bottom shoes.  Patient will be seen back to recheck in 6 weeks and may have to decide on surgical procedure

## 2021-03-12 ENCOUNTER — Other Ambulatory Visit (INDEPENDENT_AMBULATORY_CARE_PROVIDER_SITE_OTHER): Payer: Self-pay | Admitting: Family Medicine

## 2021-03-12 DIAGNOSIS — I10 Essential (primary) hypertension: Secondary | ICD-10-CM

## 2021-03-13 ENCOUNTER — Encounter (INDEPENDENT_AMBULATORY_CARE_PROVIDER_SITE_OTHER): Payer: Self-pay | Admitting: Family Medicine

## 2021-03-13 ENCOUNTER — Ambulatory Visit (INDEPENDENT_AMBULATORY_CARE_PROVIDER_SITE_OTHER): Payer: BC Managed Care – PPO | Admitting: Family Medicine

## 2021-03-13 ENCOUNTER — Other Ambulatory Visit: Payer: Self-pay

## 2021-03-13 VITALS — BP 120/77 | HR 71 | Temp 97.9°F | Ht 63.0 in | Wt 195.0 lb

## 2021-03-13 DIAGNOSIS — I10 Essential (primary) hypertension: Secondary | ICD-10-CM | POA: Diagnosis not present

## 2021-03-13 DIAGNOSIS — Z6835 Body mass index (BMI) 35.0-35.9, adult: Secondary | ICD-10-CM

## 2021-03-13 DIAGNOSIS — R7303 Prediabetes: Secondary | ICD-10-CM

## 2021-03-13 DIAGNOSIS — E559 Vitamin D deficiency, unspecified: Secondary | ICD-10-CM | POA: Diagnosis not present

## 2021-03-13 DIAGNOSIS — Z9189 Other specified personal risk factors, not elsewhere classified: Secondary | ICD-10-CM | POA: Diagnosis not present

## 2021-03-13 MED ORDER — VITAMIN D (ERGOCALCIFEROL) 1.25 MG (50000 UNIT) PO CAPS: 50000.0000 [IU] | ORAL_CAPSULE | ORAL | 0 refills | Status: DC

## 2021-03-13 MED ORDER — OLMESARTAN MEDOXOMIL 5 MG PO TABS: 10.0000 mg | ORAL_TABLET | Freq: Every day | ORAL | 0 refills | Status: DC

## 2021-03-13 NOTE — Progress Notes (Signed)
Chief Complaint:   OBESITY Madison Delgado is here to discuss her progress with her obesity treatment plan along with follow-up of her obesity related diagnoses. Madison Delgado is on the Category 2 Plan and states she is following her eating plan approximately 30% of the time. Madison Delgado states she is doing video exercise and walking for 30 minutes 5 times per week.  Today's visit was #: 10 Starting weight: 199 lbs Starting date: 08/03/2020 Today's weight: 195 lbs Today's date: 03/13/2021 Total lbs lost to date: 4 Total lbs lost since last in-office visit: 0  Interim History: Madison Delgado had [employer] homecoming recently and she is not eating the best. She is exercising for 15-20 minutes q daily.  Subjective:   1. Essential hypertension Madison Delgado's blood pressure is at goal. She notes less dizziness and feeling better. Her blood pressure at home ranges between 110-120/70-80's.   2. Pre-diabetes Madison Delgado has a diagnosis of pre-diabetes based on her elevated HgA1c and was informed this puts her at greater risk of developing diabetes. She is not on medications, and she continues to work on diet and exercise to decrease her risk of diabetes. She denies nausea or hypoglycemia.  3. Vitamin D deficiency Madison Delgado is currently taking prescription vitamin D 50,000 IU each week. She denies nausea, vomiting or muscle weakness.  4. At risk for deficient intake of food Madison Delgado is at a higher than average risk of deficient intake of food due to occasionally skipping meals.  Assessment/Plan:   Orders Placed This Encounter  Procedures   Comprehensive metabolic panel   Hemoglobin A1c    Medications Discontinued During This Encounter  Medication Reason   Vitamin D, Ergocalciferol, (DRISDOL) 1.25 MG (50000 UNIT) CAPS capsule Reorder   olmesartan (BENICAR) 5 MG tablet Reorder     Meds ordered this encounter  Medications   olmesartan (BENICAR) 5 MG tablet    Sig: Take 2 tablets (10 mg total) by mouth daily.     Dispense:  60 tablet    Refill:  0    30 d supply;  ** OV for RF **   Do not send RF request   Vitamin D, Ergocalciferol, (DRISDOL) 1.25 MG (50000 UNIT) CAPS capsule    Sig: Take 1 capsule (50,000 Units total) by mouth every 7 (seven) days.    Dispense:  4 capsule    Refill:  0    Ov for RF     1. Essential hypertension Madison Delgado will decrease salt and will continue her medications to improve blood pressure control. We will refill Benicar for 1 month, no change in dose and we will recheck CMP at her next office visit. We will watch for signs of hypotension as she continues her lifestyle modifications.  - olmesartan (BENICAR) 5 MG tablet; Take 2 tablets (10 mg total) by mouth daily.  Dispense: 60 tablet; Refill: 0 - Comprehensive metabolic panel  2. Pre-diabetes Madison Delgado will continue to work on weight loss, exercise, and decreasing simple carbohydrates to help decrease the risk of diabetes. We will recheck her A1c at her next office visit. We will consider medications in the future, as she doesn't like to take them.  - Hemoglobin A1c  3. Vitamin D deficiency Low Vitamin D level contributes to fatigue and are associated with obesity, breast, and colon cancer. We will refill prescription Vitamin D for 1 month. We will recheck her Vit D after 2-3 months of Winter, and Madison Delgado will follow-up for routine testing of Vitamin D, at least 2-3  times per year to avoid over-replacement.  - Vitamin D, Ergocalciferol, (DRISDOL) 1.25 MG (50000 UNIT) CAPS capsule; Take 1 capsule (50,000 Units total) by mouth every 7 (seven) days.  Dispense: 4 capsule; Refill: 0  4. At risk for deficient intake of food Madison Delgado was given approximately 9 minutes of deficit intake of food prevention counseling today. Madison Delgado is at risk for eating too few calories based on current food recall. She was encouraged to focus on meeting caloric and protein goals according to her recommended meal plan.   5. Class 2 severe obesity  with serious comorbidity and body mass index (BMI) of 35.0 to 35.9 in adult, unspecified obesity type (HCC) Madison Delgado is currently in the action stage of change. As such, her goal is to continue with weight loss efforts. She has agreed to the Category 2 Plan.   Madison Delgado is to ween off regular ginger ale, mix with diet. She is to limit it per day.  Exercise goals: As is.  Behavioral modification strategies: avoiding temptations and planning for success.  Madison Delgado has agreed to follow-up with our clinic in 2 weeks. She was informed of the importance of frequent follow-up visits to maximize her success with intensive lifestyle modifications for her multiple health conditions.   Objective:   Blood pressure 120/77, pulse 71, temperature 97.9 F (36.6 C), height 5\' 3"  (1.6 m), weight 195 lb (88.5 kg), SpO2 99 %. Body mass index is 34.54 kg/m.  General: Cooperative, alert, well developed, in no acute distress. HEENT: Conjunctivae and lids unremarkable. Cardiovascular: Regular rhythm.  Lungs: Normal work of breathing. Neurologic: No focal deficits.   Lab Results  Component Value Date   CREATININE 0.79 08/03/2020   BUN 18 08/03/2020   NA 142 08/03/2020   K 4.5 08/03/2020   CL 103 08/03/2020   CO2 21 08/03/2020   Lab Results  Component Value Date   ALT 27 08/03/2020   AST 22 08/03/2020   ALKPHOS 52 08/03/2020   BILITOT 0.2 08/03/2020   Lab Results  Component Value Date   HGBA1C 5.7 (H) 12/05/2020   HGBA1C 6.1 (H) 08/03/2020   Lab Results  Component Value Date   INSULIN 9.2 08/03/2020   Lab Results  Component Value Date   TSH 0.540 08/03/2020   Lab Results  Component Value Date   CHOL 163 08/03/2020   HDL 54 08/03/2020   LDLCALC 90 08/03/2020   TRIG 103 08/03/2020   CHOLHDL 3.0 08/03/2020   Lab Results  Component Value Date   VD25OH 65.3 12/05/2020   VD25OH 34.3 08/03/2020   Lab Results  Component Value Date   WBC 4.6 08/03/2020   HGB 13.3 08/03/2020   HCT 41.1  08/03/2020   MCV 83 08/03/2020   PLT 275 08/03/2020   No results found for: IRON, TIBC, FERRITIN  Attestation Statements:   Reviewed by clinician on day of visit: allergies, medications, problem list, medical history, surgical history, family history, social history, and previous encounter notes.   Wilhemena Durie, am acting as transcriptionist for [employer], DO.  I have reviewed the above documentation for accuracy and completeness, and I agree with the above. Marjory Sneddon, D.O.  [place] was signed into law in 2016 which includes the topic of electronic health records.  This provides immediate access to information in MyChart.  This includes consultation notes, operative notes, office notes, lab results and pathology reports.  If you have any questions about what you read please  let us know at your next visit so we can discuss your concerns and take corrective action if need be.  We are right here with you.

## 2021-03-16 ENCOUNTER — Ambulatory Visit
Admission: RE | Admit: 2021-03-16 | Discharge: 2021-03-16 | Disposition: A | Discharge status: Home / Self Care | Payer: BC Managed Care – PPO | Source: Ambulatory Visit | Attending: Internal Medicine | Admitting: Internal Medicine

## 2021-03-16 ENCOUNTER — Other Ambulatory Visit: Payer: Self-pay

## 2021-03-16 DIAGNOSIS — Z1231 Encounter for screening mammogram for malignant neoplasm of breast: Secondary | ICD-10-CM | POA: Diagnosis not present

## 2021-04-03 ENCOUNTER — Encounter (INDEPENDENT_AMBULATORY_CARE_PROVIDER_SITE_OTHER): Payer: Self-pay | Admitting: Family Medicine

## 2021-04-03 ENCOUNTER — Ambulatory Visit (INDEPENDENT_AMBULATORY_CARE_PROVIDER_SITE_OTHER): Payer: BC Managed Care – PPO | Admitting: Family Medicine

## 2021-04-03 ENCOUNTER — Other Ambulatory Visit: Payer: Self-pay

## 2021-04-03 VITALS — BP 127/79 | HR 74 | Temp 98.2°F | Ht 63.0 in | Wt 195.0 lb

## 2021-04-03 DIAGNOSIS — Z9189 Other specified personal risk factors, not elsewhere classified: Secondary | ICD-10-CM | POA: Diagnosis not present

## 2021-04-03 DIAGNOSIS — E66812 Obesity, class 2: Secondary | ICD-10-CM

## 2021-04-03 DIAGNOSIS — I10 Essential (primary) hypertension: Secondary | ICD-10-CM

## 2021-04-03 DIAGNOSIS — Z6835 Body mass index (BMI) 35.0-35.9, adult: Secondary | ICD-10-CM

## 2021-04-03 DIAGNOSIS — E559 Vitamin D deficiency, unspecified: Secondary | ICD-10-CM

## 2021-04-03 DIAGNOSIS — R7303 Prediabetes: Secondary | ICD-10-CM | POA: Diagnosis not present

## 2021-04-03 MED ORDER — VITAMIN D (ERGOCALCIFEROL) 1.25 MG (50000 UNIT) PO CAPS: 50000.0000 [IU] | ORAL_CAPSULE | ORAL | 0 refills | Status: DC

## 2021-04-03 MED ORDER — OLMESARTAN MEDOXOMIL 5 MG PO TABS: 10.0000 mg | ORAL_TABLET | Freq: Every day | ORAL | 0 refills | Status: DC

## 2021-04-03 NOTE — Progress Notes (Signed)
Chief Complaint:   OBESITY Madison Delgado is here to discuss her progress with her obesity treatment plan along with follow-up of her obesity related diagnoses. Madison Delgado is on the Category 2 Plan and states she is following her eating plan approximately 20% of the time. Madison Delgado states she is doing step aerobics videos for 15-30 minutes 5 times per week.  Today's visit was #: 11 Starting weight: 199 lbs Starting date: 08/03/2020 Today's weight: 195 lbs Today's date: 04/03/2021 Total lbs lost to date: 4 Total lbs lost since last in-office visit: 0  Interim History: Yaresly has no issues with the plan, but she is eating out too much and too many sweets. She has been faithful with exercising in the morning with Walk at home by [facility].  Subjective:   1. Pre-diabetes Madison Delgado notes sweet cravings still, and she notes she loves her candy and she leaves it at work. She did go one week without any and she knows that she can.  2. Essential hypertension Madison Delgado's blood pressure is at goal. She notes decrease in dizziness, and not lasting any more than a second.  BP Readings from Last 3 Encounters:  04/03/21 127/79  03/13/21 120/77  02/09/21 121/79   3. Vitamin D deficiency Madison Delgado is currently taking prescription vitamin D 50,000 IU each week. She denies nausea, vomiting or muscle weakness.  4. At risk for dehydration Madison Delgado is at risk for dehydration due to inadequate water intake.  Assessment/Plan:   Orders Placed This Encounter  Procedures   VITAMIN D 25 Hydroxy (Vit-D Deficiency, Fractures)    Medications Discontinued During This Encounter  Medication Reason   olmesartan (BENICAR) 5 MG tablet Reorder   Vitamin D, Ergocalciferol, (DRISDOL) 1.25 MG (50000 UNIT) CAPS capsule Reorder     Meds ordered this encounter  Medications   olmesartan (BENICAR) 5 MG tablet    Sig: Take 2 tablets (10 mg total) by mouth daily.    Dispense:  60 tablet    Refill:  0    30 d supply;  ** OV  for RF **   Do not send RF request   Vitamin D, Ergocalciferol, (DRISDOL) 1.25 MG (50000 UNIT) CAPS capsule    Sig: Take 1 capsule (50,000 Units total) by mouth every 7 (seven) days.    Dispense:  4 capsule    Refill:  0    Ov for RF     1. Pre-diabetes Madison Delgado declines medications at this time, and metformin handout was given. She will continue to work on weight loss, exercise, and decreasing simple carbohydrates to help decrease the risk of diabetes.   2. Essential hypertension We will refill Benicar for 1 month. Madison Delgado will follow up with her primary care physician in the near future and ask about decreasing her atenolol. We will watch for signs of hypotension as she continues her lifestyle modifications.  - olmesartan (BENICAR) 5 MG tablet; Take 2 tablets (10 mg total) by mouth daily.  Dispense: 60 tablet; Refill: 0  3. Vitamin D deficiency Low Vitamin D level contributes to fatigue and are associated with obesity, breast, and colon cancer. We will check labs. Madison Delgado prescription Vitamin D 50,000 IU every week, and we will refill for 1 month. She will follow-up for routine testing of Vitamin D, at least 2-3 times per year to avoid over-replacement.  - Vitamin D, Ergocalciferol, (DRISDOL) 1.25 MG (50000 UNIT) CAPS capsule; Take 1 capsule (50,000 Units total) by mouth every 7 (seven) days.  Dispense: 4 capsule;  Refill: 0 - VITAMIN D 25 Hydroxy (Vit-D Deficiency, Fractures)  4. At risk for dehydration Madison Delgado was given approximately 9 minutes dehydration prevention counseling today. Madison Delgado is at risk for dehydration due to weight loss and current medication(s). She was encouraged to hydrate and monitor fluid status to avoid dehydration as well as weight loss plateaus.   5. Obesity with current BMI of 34.7 Madison Delgado is currently in the action stage of change. As such, her goal is to continue with weight loss efforts. She has agreed to the Category 2 Plan.   Madison Delgado is to focus on  decreasing simple carbohydrates, candy, and starches. Thanksgiving day strategies were discussed with the patient and handout was given.  Exercise goals: As is.  Behavioral modification strategies: increasing lean protein intake, decreasing simple carbohydrates, meal planning and cooking strategies, and holiday eating strategies .  Madison Delgado has agreed to follow-up with our clinic in 2 to 3 weeks. She was informed of the importance of frequent follow-up visits to maximize her success with intensive lifestyle modifications for her multiple health conditions.   Objective:   Blood pressure 127/79, pulse 74, temperature 98.2 F (36.8 C), height 5\' 3"  (1.6 m), weight 195 lb (88.5 kg), SpO2 100 %. Body mass index is 34.54 kg/m.  General: Cooperative, alert, well developed, in no acute distress. HEENT: Conjunctivae and lids unremarkable. Cardiovascular: Regular rhythm.  Lungs: Normal work of breathing. Neurologic: No focal deficits.   Lab Results  Component Value Date   CREATININE 0.79 08/03/2020   BUN 18 08/03/2020   NA 142 08/03/2020   K 4.5 08/03/2020   CL 103 08/03/2020   CO2 21 08/03/2020   Lab Results  Component Value Date   ALT 27 08/03/2020   AST 22 08/03/2020   ALKPHOS 52 08/03/2020   BILITOT 0.2 08/03/2020   Lab Results  Component Value Date   HGBA1C 5.7 (H) 12/05/2020   HGBA1C 6.1 (H) 08/03/2020   Lab Results  Component Value Date   INSULIN 9.2 08/03/2020   Lab Results  Component Value Date   TSH 0.540 08/03/2020   Lab Results  Component Value Date   CHOL 163 08/03/2020   HDL 54 08/03/2020   LDLCALC 90 08/03/2020   TRIG 103 08/03/2020   CHOLHDL 3.0 08/03/2020   Lab Results  Component Value Date   VD25OH 65.3 12/05/2020   VD25OH 34.3 08/03/2020   Lab Results  Component Value Date   WBC 4.6 08/03/2020   HGB 13.3 08/03/2020   HCT 41.1 08/03/2020   MCV 83 08/03/2020   PLT 275 08/03/2020   No results found for: IRON, TIBC, FERRITIN  Attestation  Statements:   Reviewed by clinician on day of visit: allergies, medications, problem list, medical history, surgical history, family history, social history, and previous encounter notes.   Wilhemena Durie, am acting as transcriptionist for [employer], DO.  I have reviewed the above documentation for accuracy and completeness, and I agree with the above. Marjory Sneddon, D.O.  The [place] was signed into law in 2016 which includes the topic of electronic health records.  This provides immediate access to information in MyChart.  This includes consultation notes, operative notes, office notes, lab results and pathology reports.  If you have any questions about what you read please let us know at your next visit so we can discuss your concerns and take corrective action if need be.  We are right here with you.

## 2021-04-04 LAB — COMPREHENSIVE METABOLIC PANEL
ALT: 25 IU/L (ref 0–32)
AST: 19 IU/L (ref 0–40)
Albumin/Globulin Ratio: 2 (ref 1.2–2.2)
Albumin: 4.2 g/dL (ref 3.8–4.9)
Alkaline Phosphatase: 51 IU/L (ref 44–121)
BUN/Creatinine Ratio: 24 — ABNORMAL HIGH (ref 9–23)
BUN: 18 mg/dL (ref 6–24)
Bilirubin Total: <0.2 mg/dL (ref 0.0–1.2)
CO2: 25 mmol/L (ref 20–29)
Calcium: 9.2 mg/dL (ref 8.7–10.2)
Chloride: 103 mmol/L (ref 96–106)
Creatinine, Ser: 0.76 mg/dL (ref 0.57–1.00)
Globulin, Total: 2.1 g/dL (ref 1.5–4.5)
Glucose: 86 mg/dL (ref 70–99)
Potassium: 4.3 mmol/L (ref 3.5–5.2)
Sodium: 143 mmol/L (ref 134–144)
Total Protein: 6.3 g/dL (ref 6.0–8.5)
eGFR: 90 mL/min/{1.73_m2} (ref >59)

## 2021-04-04 LAB — HEMOGLOBIN A1C
Est. average glucose Bld gHb Est-mCnc: 117 mg/dL
Hgb A1c MFr Bld: 5.7 % — ABNORMAL HIGH (ref 4.8–5.6)

## 2021-04-04 LAB — VITAMIN D 25 HYDROXY (VIT D DEFICIENCY, FRACTURES): Vit D, 25-Hydroxy: 61.5 ng/mL (ref 30.0–100.0)

## 2021-04-19 DIAGNOSIS — H2512 Age-related nuclear cataract, left eye: Secondary | ICD-10-CM | POA: Diagnosis not present

## 2021-04-19 DIAGNOSIS — R7309 Other abnormal glucose: Secondary | ICD-10-CM | POA: Diagnosis not present

## 2021-04-19 DIAGNOSIS — H10413 Chronic giant papillary conjunctivitis, bilateral: Secondary | ICD-10-CM | POA: Diagnosis not present

## 2021-04-19 DIAGNOSIS — H43813 Vitreous degeneration, bilateral: Secondary | ICD-10-CM | POA: Diagnosis not present

## 2021-04-20 ENCOUNTER — Ambulatory Visit (INDEPENDENT_AMBULATORY_CARE_PROVIDER_SITE_OTHER): Payer: BC Managed Care – PPO | Admitting: Family Medicine

## 2021-05-04 ENCOUNTER — Ambulatory Visit (INDEPENDENT_AMBULATORY_CARE_PROVIDER_SITE_OTHER): Payer: BC Managed Care – PPO | Admitting: Family Medicine

## 2021-05-04 ENCOUNTER — Encounter (INDEPENDENT_AMBULATORY_CARE_PROVIDER_SITE_OTHER): Payer: Self-pay | Admitting: Family Medicine

## 2021-05-04 ENCOUNTER — Other Ambulatory Visit: Payer: Self-pay

## 2021-05-04 VITALS — BP 132/81 | HR 87 | Temp 98.6°F | Ht 63.0 in | Wt 196.0 lb

## 2021-05-04 DIAGNOSIS — Z6835 Body mass index (BMI) 35.0-35.9, adult: Secondary | ICD-10-CM

## 2021-05-04 DIAGNOSIS — E559 Vitamin D deficiency, unspecified: Secondary | ICD-10-CM | POA: Diagnosis not present

## 2021-05-04 DIAGNOSIS — E669 Obesity, unspecified: Secondary | ICD-10-CM

## 2021-05-04 DIAGNOSIS — I10 Essential (primary) hypertension: Secondary | ICD-10-CM

## 2021-05-04 DIAGNOSIS — E860 Dehydration: Secondary | ICD-10-CM | POA: Diagnosis not present

## 2021-05-04 DIAGNOSIS — R7303 Prediabetes: Secondary | ICD-10-CM

## 2021-05-04 DIAGNOSIS — E66812 Obesity, class 2: Secondary | ICD-10-CM

## 2021-05-04 DIAGNOSIS — Z6834 Body mass index (BMI) 34.0-34.9, adult: Secondary | ICD-10-CM

## 2021-05-04 DIAGNOSIS — Z9189 Other specified personal risk factors, not elsewhere classified: Secondary | ICD-10-CM

## 2021-05-04 MED ORDER — OLMESARTAN MEDOXOMIL 5 MG PO TABS: 10.0000 mg | ORAL_TABLET | Freq: Every day | ORAL | 0 refills | Status: DC

## 2021-05-04 MED ORDER — VITAMIN D (ERGOCALCIFEROL) 1.25 MG (50000 UNIT) PO CAPS: 50000.0000 [IU] | ORAL_CAPSULE | ORAL | 0 refills | Status: DC

## 2021-05-04 NOTE — Progress Notes (Signed)
Chief Complaint:   OBESITY Madison Delgado is here to discuss her progress with her obesity treatment plan along with follow-up of her obesity related diagnoses. Madison Delgado is on the Category 2 Plan and states she is following her eating plan approximately 20% of the time. Madison Delgado states she is on the treadmill for 30 minutes 5 times per week.  Today's visit was #: 12 Starting weight: 199 lbs Starting date: 08/03/2020 Today's weight: 196 lbs Today's date: 05/04/2021 Total lbs lost to date: 3 Total lbs lost since last in-office visit: 0  Interim History: Madison Delgado states that she is proud of myself due to festivities and celebration eating lately. She brought a treadmill and she uses it 5 days per week for 30 minutes. She increased her water intake to 64 oz per day again. She goes to [place] on January 11th and she wants to feel good when she goes. She is here to review labs today.  Subjective:   1. Essential hypertension Juliona's blood pressure is at goal. She is not having palpitations any longer that are bothersome. She is taking Tenormin, olmesartan, and Pristiq which helps with anxiety.   2. Pre-diabetes Madison Delgado's A1c is still 5.7, no change from prior but overall improved from start of the program. I discussed labs with the patient today.   3. Mild dehydration Madison Delgado has mild dehydration with elevated BUN and creatinine on CMP. She denies constipation. I discussed labs with the patient today.  4. Vitamin D deficiency Madison Delgado's last Vit D level was at goal. I discussed labs with the patient today.  5. At risk for constipation Madison Delgado is at increased risk for constipation due to inadequate water intake, changes in diet, and/or use of medications such as GLP1 agonists. Madison Delgado denies hard, infrequent stools currently.   Assessment/Plan:  No orders of the defined types were placed in this encounter.   Medications Discontinued During This Encounter  Medication Reason    olmesartan (BENICAR) 5 MG tablet Reorder   Vitamin D, Ergocalciferol, (DRISDOL) 1.25 MG (50000 UNIT) CAPS capsule Reorder     Meds ordered this encounter  Medications   olmesartan (BENICAR) 5 MG tablet    Sig: Take 2 tablets (10 mg total) by mouth daily.    Dispense:  60 tablet    Refill:  0    30 d supply;  ** OV for RF **   Do not send RF request   Vitamin D, Ergocalciferol, (DRISDOL) 1.25 MG (50000 UNIT) CAPS capsule    Sig: Take 1 capsule (50,000 Units total) by mouth every 7 (seven) days.    Dispense:  4 capsule    Refill:  0    Ov for RF     1. Essential hypertension We will refill olmesartan for 1 month. Madison Delgado will continue working on healthy weight loss and exercise to improve blood pressure control. We will watch for signs of hypotension as she continues her lifestyle modifications.  - olmesartan (BENICAR) 5 MG tablet; Take 2 tablets (10 mg total) by mouth daily.  Dispense: 60 tablet; Refill: 0  2. Pre-diabetes Madison Delgado will continue to work on weight loss, exercise, and decreasing simple carbohydrates to help decrease the risk of diabetes.   3. Mild dehydration Madison Delgado is to increase his water intake, and we will follow up at her next office visit.  4. Vitamin D deficiency We will refill prescription Vitamin D for 1 month. Madison Delgado will follow-up for routine testing of Vitamin D, at least 2-3 times per  year to avoid over-replacement.  - Vitamin D, Ergocalciferol, (DRISDOL) 1.25 MG (50000 UNIT) CAPS capsule; Take 1 capsule (50,000 Units total) by mouth every 7 (seven) days.  Dispense: 4 capsule; Refill: 0  5. At risk for constipation Madison Delgado was given approximately 9 minutes of counseling today regarding prevention of constipation. She was encouraged to increase water and fiber intake.   6. Obesity with current BMI of 34.7 Madison Delgado is currently in the action stage of change. As such, her goal is to continue with weight loss efforts. She has agreed to the Category 2 Plan.    Madison Delgado was given a handout on holiday eating strategies.   Exercise goals: For substantial health benefits, adults should do at least 150 minutes (2 hours and 30 minutes) a week of moderate-intensity, or 75 minutes (1 hour and 15 minutes) a week of vigorous-intensity aerobic physical activity, or an equivalent combination of moderate- and vigorous-intensity aerobic activity. Aerobic activity should be performed in episodes of at least 10 minutes, and preferably, it should be spread throughout the week.  Behavioral modification strategies: increasing lean protein intake, decreasing simple carbohydrates, holiday eating strategies , and celebration eating strategies.  Madison Delgado has agreed to follow-up with our clinic in 9 weeks. She was informed of the importance of frequent follow-up visits to maximize her success with intensive lifestyle modifications for her multiple health conditions.   Objective:   Blood pressure 132/81, pulse 87, temperature 98.6 F (37 C), height 5\' 3"  (1.6 m), weight 196 lb (88.9 kg), SpO2 97 %. Body mass index is 34.72 kg/m.  General: Cooperative, alert, well developed, in no acute distress. HEENT: Conjunctivae and lids unremarkable. Cardiovascular: Regular rhythm.  Lungs: Normal work of breathing. Neurologic: No focal deficits.   Lab Results  Component Value Date   CREATININE 0.76 04/03/2021   BUN 18 04/03/2021   NA 143 04/03/2021   K 4.3 04/03/2021   CL 103 04/03/2021   CO2 25 04/03/2021   Lab Results  Component Value Date   ALT 25 04/03/2021   AST 19 04/03/2021   ALKPHOS 51 04/03/2021   BILITOT <0.2 04/03/2021   Lab Results  Component Value Date   HGBA1C 5.7 (H) 04/03/2021   HGBA1C 5.7 (H) 12/05/2020   HGBA1C 6.1 (H) 08/03/2020   Lab Results  Component Value Date   INSULIN 9.2 08/03/2020   Lab Results  Component Value Date   TSH 0.540 08/03/2020   Lab Results  Component Value Date   CHOL 163 08/03/2020   HDL 54 08/03/2020   LDLCALC  90 08/03/2020   TRIG 103 08/03/2020   CHOLHDL 3.0 08/03/2020   Lab Results  Component Value Date   VD25OH 61.5 04/03/2021   VD25OH 65.3 12/05/2020   VD25OH 34.3 08/03/2020   Lab Results  Component Value Date   WBC 4.6 08/03/2020   HGB 13.3 08/03/2020   HCT 41.1 08/03/2020   MCV 83 08/03/2020   PLT 275 08/03/2020   No results found for: IRON, TIBC, FERRITIN  Attestation Statements:   Reviewed by clinician on day of visit: allergies, medications, problem list, medical history, surgical history, family history, social history, and previous encounter notes.   Wilhemena Durie, am acting as transcriptionist for [employer], DO.  I have reviewed the above documentation for accuracy and completeness, and I agree with the above. Marjory Sneddon, D.O.  The [place] was signed into law in 2016 which includes the topic of electronic health records.  This  provides immediate access to information in MyChart.  This includes consultation notes, operative notes, office notes, lab results and pathology reports.  If you have any questions about what you read please let us know at your next visit so we can discuss your concerns and take corrective action if need be.  We are right here with you.

## 2021-05-29 ENCOUNTER — Encounter (INDEPENDENT_AMBULATORY_CARE_PROVIDER_SITE_OTHER): Payer: Self-pay | Admitting: Family Medicine

## 2021-05-29 ENCOUNTER — Ambulatory Visit (INDEPENDENT_AMBULATORY_CARE_PROVIDER_SITE_OTHER): Payer: BC Managed Care – PPO | Admitting: Family Medicine

## 2021-05-29 ENCOUNTER — Other Ambulatory Visit: Payer: Self-pay

## 2021-05-29 VITALS — BP 121/79 | HR 73 | Temp 97.9°F | Ht 63.0 in | Wt 197.0 lb

## 2021-05-29 DIAGNOSIS — I10 Essential (primary) hypertension: Secondary | ICD-10-CM | POA: Diagnosis not present

## 2021-05-29 DIAGNOSIS — R7303 Prediabetes: Secondary | ICD-10-CM

## 2021-05-29 DIAGNOSIS — E669 Obesity, unspecified: Secondary | ICD-10-CM

## 2021-05-29 DIAGNOSIS — E559 Vitamin D deficiency, unspecified: Secondary | ICD-10-CM | POA: Diagnosis not present

## 2021-05-29 DIAGNOSIS — Z6835 Body mass index (BMI) 35.0-35.9, adult: Secondary | ICD-10-CM

## 2021-05-29 DIAGNOSIS — Z9189 Other specified personal risk factors, not elsewhere classified: Secondary | ICD-10-CM

## 2021-05-29 MED ORDER — OLMESARTAN MEDOXOMIL 5 MG PO TABS: 10.0000 mg | ORAL_TABLET | Freq: Every day | ORAL | 0 refills | Status: DC

## 2021-05-29 MED ORDER — VITAMIN D (ERGOCALCIFEROL) 1.25 MG (50000 UNIT) PO CAPS: 50000.0000 [IU] | ORAL_CAPSULE | ORAL | 0 refills | Status: DC

## 2021-05-30 NOTE — Progress Notes (Signed)
Chief Complaint:   OBESITY Madison Delgado is here to discuss her progress with her obesity treatment plan along with follow-up of her obesity related diagnoses. Madison Delgado is on the Category 2 Plan and states she is following her eating plan approximately 20% of the time. Madison Delgado states she is doing cardio and on the treadmill for 30-45 minutes 5 times per week.  Today's visit was #: 13 Starting weight: 199 lbs Starting date: 08/03/2020 Today's weight: 197 lbs Today's date: 05/29/2021 Total lbs lost to date: 2 Total lbs lost since last in-office visit: 0  Interim History: Madison Delgado was in [place] for 4-5 days over the holidays, and with the holidays she only gained 1 lb. Today she has already gone to the grocery store to get her food, and she started exercising on the treadmill which she didn't do over the holidays.  Subjective:   1. Pre-diabetes Madison Delgado has sweet cravings, but she can control them when she sets her mind to it. She declines medications.  2. Vitamin D deficiency Madison Delgado is currently taking prescription vitamin D 50,000 IU each week. She denies nausea, vomiting or muscle weakness.  3. Essential hypertension Madison Delgado is tolerating medication(s) well without side effects.  Medication compliance is good and patient appears to be taking it as prescribed.  Denies additional symptoms or concerns regarding this condition.   4. At risk for constipation Madison Delgado is at increased risk for constipation due to inadequate water intake.  Assessment/Plan:  No orders of the defined types were placed in this encounter.   Medications Discontinued During This Encounter  Medication Reason   olmesartan (BENICAR) 5 MG tablet Reorder   Vitamin D, Ergocalciferol, (DRISDOL) 1.25 MG (50000 UNIT) CAPS capsule Reorder     Meds ordered this encounter  Medications   olmesartan (BENICAR) 5 MG tablet    Sig: Take 2 tablets (10 mg total) by mouth daily.    Dispense:  60 tablet     Refill:  0    30 d supply;  ** OV for RF **   Do not send RF request   Vitamin D, Ergocalciferol, (DRISDOL) 1.25 MG (50000 UNIT) CAPS capsule    Sig: Take 1 capsule (50,000 Units total) by mouth every 7 (seven) days.    Dispense:  4 capsule    Refill:  0    Ov for RF     1. Pre-diabetes Madison Delgado will continue to work on weight loss, exercise, and decreasing simple carbohydrates to help decrease the risk of diabetes.   2. Vitamin D deficiency We will refill prescription Vitamin D for 1 month. Madison Delgado will follow-up for routine testing of Vitamin D, at least 2-3 times per year to avoid over-replacement.  - Vitamin D, Ergocalciferol, (DRISDOL) 1.25 MG (50000 UNIT) CAPS capsule; Take 1 capsule (50,000 Units total) by mouth every 7 (seven) days.  Dispense: 4 capsule; Refill: 0  3. Essential hypertension Madison Delgado's blood pressure is at goal today. We will refill Benicar for 1 month.  - Counseled Madison Delgado Form on pathophysiology of disease and discussed treatment plan, which always includes dietary and lifestyle modification as first line.  - Lifestyle changes such as following our low salt, heart healthy meal plan and engaging in a regular exercise program discussed  - Avoid buying foods that are: processed, frozen, or prepackaged to avoid excess salt. - Ambulatory blood pressure monitoring encouraged.  Reminded patient that if they ever feel poorly in any way, to check their blood pressure and  pulse as well. - We will continue to monitor closely alongside PCP/ specialists.  Pt reminded to also f/up with those individuals as instructed by them.  - We will continue to monitor symptoms as they relate to the her weight loss journey.  - olmesartan (BENICAR) 5 MG tablet; Take 2 tablets (10 mg total) by mouth daily.  Dispense: 60 tablet; Refill: 0  4. At risk for constipation Madison Delgado was given approximately 15 minutes of counseling today regarding prevention of constipation. She was encouraged to  increase water and fiber intake.   5. Obesity with current BMI of 35.0 Madison Delgado is currently in the action stage of change. As such, her goal is to continue with weight loss efforts. She has agreed to the Category 2 Plan.   Exercise goals: As is.  Behavioral modification strategies: increasing water intake, better snacking choices, and avoiding temptations.  Madison Delgado has agreed to follow-up with our clinic in 3 weeks. She was informed of the importance of frequent follow-up visits to maximize her success with intensive lifestyle modifications for her multiple health conditions.   Objective:   Blood pressure 121/79, pulse 73, temperature 97.9 F (36.6 C), height 5\' 3"  (1.6 m), weight 197 lb (89.4 kg), SpO2 99 %. Body mass index is 34.9 kg/m.  General: Cooperative, alert, well developed, in no acute distress. HEENT: Conjunctivae and lids unremarkable. Cardiovascular: Regular rhythm.  Lungs: Normal work of breathing. Neurologic: No focal deficits.   Lab Results  Component Value Date   CREATININE 0.76 04/03/2021   BUN 18 04/03/2021   NA 143 04/03/2021   K 4.3 04/03/2021   CL 103 04/03/2021   CO2 25 04/03/2021   Lab Results  Component Value Date   ALT 25 04/03/2021   AST 19 04/03/2021   ALKPHOS 51 04/03/2021   BILITOT <0.2 04/03/2021   Lab Results  Component Value Date   HGBA1C 5.7 (H) 04/03/2021   HGBA1C 5.7 (H) 12/05/2020   HGBA1C 6.1 (H) 08/03/2020   Lab Results  Component Value Date   INSULIN 9.2 08/03/2020   Lab Results  Component Value Date   TSH 0.540 08/03/2020   Lab Results  Component Value Date   CHOL 163 08/03/2020   HDL 54 08/03/2020   LDLCALC 90 08/03/2020   TRIG 103 08/03/2020   CHOLHDL 3.0 08/03/2020   Lab Results  Component Value Date   VD25OH 61.5 04/03/2021   VD25OH 65.3 12/05/2020   VD25OH 34.3 08/03/2020   Lab Results  Component Value Date   WBC 4.6 08/03/2020   HGB 13.3 08/03/2020   HCT 41.1 08/03/2020   MCV 83 08/03/2020   PLT  275 08/03/2020   No results found for: IRON, TIBC, FERRITIN  Attestation Statements:   Reviewed by clinician on day of visit: allergies, medications, problem list, medical history, surgical history, family history, social history, and previous encounter notes.   Wilhemena Durie, am acting as Location manager for [employer], DO.  I have reviewed the above documentation for accuracy and completeness, and I agree with the above. Marjory Sneddon, D.O.  The [place] was signed into law in 2016 which includes the topic of electronic health records.  This provides immediate access to information in MyChart.  This includes consultation notes, operative notes, office notes, lab results and pathology reports.  If you have any questions about what you read please let us know at your next visit so we can discuss your concerns and take corrective action if  need be.  We are right here with you.

## 2021-06-05 DIAGNOSIS — I10 Essential (primary) hypertension: Secondary | ICD-10-CM | POA: Diagnosis not present

## 2021-06-05 DIAGNOSIS — Z0000 Encounter for general adult medical examination without abnormal findings: Secondary | ICD-10-CM | POA: Diagnosis not present

## 2021-06-05 DIAGNOSIS — Z23 Encounter for immunization: Secondary | ICD-10-CM | POA: Diagnosis not present

## 2021-06-05 DIAGNOSIS — R7303 Prediabetes: Secondary | ICD-10-CM | POA: Diagnosis not present

## 2021-06-19 ENCOUNTER — Other Ambulatory Visit: Payer: Self-pay

## 2021-06-19 ENCOUNTER — Ambulatory Visit (INDEPENDENT_AMBULATORY_CARE_PROVIDER_SITE_OTHER): Payer: BC Managed Care – PPO | Admitting: Family Medicine

## 2021-06-19 ENCOUNTER — Encounter (INDEPENDENT_AMBULATORY_CARE_PROVIDER_SITE_OTHER): Payer: Self-pay | Admitting: Family Medicine

## 2021-06-19 VITALS — BP 120/78 | HR 71 | Temp 98.1°F | Ht 63.0 in | Wt 196.0 lb

## 2021-06-19 DIAGNOSIS — E669 Obesity, unspecified: Secondary | ICD-10-CM

## 2021-06-19 DIAGNOSIS — E559 Vitamin D deficiency, unspecified: Secondary | ICD-10-CM | POA: Diagnosis not present

## 2021-06-19 DIAGNOSIS — R7303 Prediabetes: Secondary | ICD-10-CM

## 2021-06-19 DIAGNOSIS — I10 Essential (primary) hypertension: Secondary | ICD-10-CM

## 2021-06-19 DIAGNOSIS — Z6834 Body mass index (BMI) 34.0-34.9, adult: Secondary | ICD-10-CM

## 2021-06-19 DIAGNOSIS — Z9189 Other specified personal risk factors, not elsewhere classified: Secondary | ICD-10-CM

## 2021-06-19 MED ORDER — OLMESARTAN MEDOXOMIL 5 MG PO TABS: 10.0000 mg | ORAL_TABLET | Freq: Every day | ORAL | 0 refills | Status: DC

## 2021-06-19 MED ORDER — VITAMIN D (ERGOCALCIFEROL) 1.25 MG (50000 UNIT) PO CAPS: 50000.0000 [IU] | ORAL_CAPSULE | ORAL | 0 refills | Status: DC

## 2021-06-20 NOTE — Progress Notes (Signed)
Chief Complaint:   OBESITY Madison Delgado is here to discuss her progress with her obesity treatment plan along with follow-up of her obesity related diagnoses. Madison Delgado is on the Category 2 Plan and states she is following her eating plan approximately 30% of the time. Madison Delgado states she is using treadmill 45 minutes 5 times per week.  Today's visit was #: 14 Starting weight: 199 lbs Starting date: 08/03/2020 Today's weight: 196 lbs Today's date: 06/19/2021 Total lbs lost to date: 3 Total lbs lost since last in-office visit: 1  Interim History: Pt is "getting a grasp" on the meal plan. She is starting to work on it with her daughter. She has an accountability partner  and is using her new treadmill and doing an excellent job.  Subjective:   1. Prediabetes PCP recently obtained A1c during CPE and it has increased to 6.0 (2.5 months ago, it was 5.7).  2. Vitamin D deficiency Pt's Vit D level was 51.1 on 06/05/2021.  3. Essential hypertension Review: taking medications as instructed, no medication side effects noted, no chest pain on exertion, no dyspnea on exertion, no swelling of ankles.   4. At risk for dehydration Madison Delgado is at risk for dehydration due to high sodium levels on recent labs.  Assessment/Plan:   1. Prediabetes A1c is up from prior.  Plan: - I reiterated and again counseled patient on pathophysiology of the disease process of I.R. - Stressed importance of dietary and lifestyle modifications to result in weight loss as first line txmnt - in addition we discussed the risks and benefits of various medication options which can help [name] in the management of this disease process as well as with weight loss.  Will consider starting one of these meds in future, or a dose adjustment in the future, as we will focus on prudent nutritional plan at this time.  - continue to decrease simple carbs; increase fiber and proteins -> follow meal plan  - handouts provided at pt's request  after education provided.  All concerns/questions addressed.   - anticipatory guidance given.   - We will recheck A1c and fasting insulin level in approximately 3 months from last check, or as deemed appropriate.   2. Vitamin D deficiency Low Vitamin D level contributes to fatigue and are associated with obesity, breast, and colon cancer. She agrees to continue to take prescription Vitamin D 50,000 IU every week and will follow-up for routine testing of Vitamin D, at least 2-3 times per year to avoid over-replacement.  Refill- Vitamin D, Ergocalciferol, (DRISDOL) 1.25 MG (50000 UNIT) CAPS capsule; Take 1 capsule (50,000 Units total) by mouth every 7 (seven) days.  Dispense: 4 capsule; Refill: 0  3. Essential hypertension BP at goal. Madison Delgado is working on healthy weight loss and exercise to improve blood pressure control. We will watch for signs of hypotension as she continues her lifestyle modifications.  Refill- olmesartan (BENICAR) 5 MG tablet; Take 2 tablets (10 mg total) by mouth daily.  Dispense: 60 tablet; Refill: 0  4. At risk for dehydration Madison Delgado is at higher than average risk of dehydration.  Madison Delgado was given more than 9 minutes of proper hydration counseling today.  We discussed the signs and symptoms of dehydration, some of which may include muscle cramping, constipation or even orthostatic symptoms.  Counseling on the prevention of dehydration was also provided today.  Madison Delgado is at risk for dehydration due to weight loss, lifestyle and behavorial habits and possibly due to taking certain medication(s).  She was encouraged to adequately hydrate and monitor fluid status to avoid dehydration as well as weight loss plateaus.  Unless pre-existing renal or cardiopulmonary conditions exist, in which patient was told to limit their fluid intake, I recommended roughly one half of their weight in pounds to be the approximate ounces of non-caloric, non-caffeinated beverages they should drink per  day; including more if they are engaging in exercise.  5. Obesity with current BMI of 34.8 Madison Delgado is currently in the action stage of change. As such, her goal is to continue with weight loss efforts. She has agreed to the Category 2 Plan.   Exercise goals:  As is  Behavioral modification strategies: increasing lean protein intake, no skipping meals, and avoiding temptations.  Madison Delgado has agreed to follow-up with our clinic in 3 weeks. She was informed of the importance of frequent follow-up visits to maximize her success with intensive lifestyle modifications for her multiple health conditions.   Objective:   Blood pressure 120/78, pulse 71, temperature 98.1 F (36.7 C), height 5\' 3"  (1.6 m), weight 196 lb (88.9 kg), SpO2 98 %. Body mass index is 34.72 kg/m.  General: Cooperative, alert, well developed, in no acute distress. HEENT: Conjunctivae and lids unremarkable. Cardiovascular: Regular rhythm.  Lungs: Normal work of breathing. Neurologic: No focal deficits.   Lab Results  Component Value Date   CREATININE 0.76 04/03/2021   BUN 18 04/03/2021   NA 143 04/03/2021   K 4.3 04/03/2021   CL 103 04/03/2021   CO2 25 04/03/2021   Lab Results  Component Value Date   ALT 25 04/03/2021   AST 19 04/03/2021   ALKPHOS 51 04/03/2021   BILITOT <0.2 04/03/2021   Lab Results  Component Value Date   HGBA1C 5.7 (H) 04/03/2021   HGBA1C 5.7 (H) 12/05/2020   HGBA1C 6.1 (H) 08/03/2020   Lab Results  Component Value Date   INSULIN 9.2 08/03/2020   Lab Results  Component Value Date   TSH 0.540 08/03/2020   Lab Results  Component Value Date   CHOL 163 08/03/2020   HDL 54 08/03/2020   LDLCALC 90 08/03/2020   TRIG 103 08/03/2020   CHOLHDL 3.0 08/03/2020   Lab Results  Component Value Date   VD25OH 61.5 04/03/2021   VD25OH 65.3 12/05/2020   VD25OH 34.3 08/03/2020   Lab Results  Component Value Date   WBC 4.6 08/03/2020   HGB 13.3 08/03/2020   HCT 41.1 08/03/2020   MCV  83 08/03/2020   PLT 275 08/03/2020   Attestation Statements:   Reviewed by clinician on day of visit: allergies, medications, problem list, medical history, surgical history, family history, social history, and previous encounter notes.  Coral Ceo, CMA, am acting as transcriptionist for [employer], DO.  I have reviewed the above documentation for accuracy and completeness, and I agree with the above. Marjory Sneddon, D.O.  The [place] was signed into law in 2016 which includes the topic of electronic health records.  This provides immediate access to information in MyChart.  This includes consultation notes, operative notes, office notes, lab results and pathology reports.  If you have any questions about what you read please let us know at your next visit so we can discuss your concerns and take corrective action if need be.  We are right here with you.

## 2021-06-21 ENCOUNTER — Other Ambulatory Visit (INDEPENDENT_AMBULATORY_CARE_PROVIDER_SITE_OTHER): Payer: Self-pay | Admitting: Family Medicine

## 2021-06-21 DIAGNOSIS — I10 Essential (primary) hypertension: Secondary | ICD-10-CM

## 2021-06-21 NOTE — Telephone Encounter (Signed)
Dr.Opalski ?

## 2021-06-27 ENCOUNTER — Other Ambulatory Visit (INDEPENDENT_AMBULATORY_CARE_PROVIDER_SITE_OTHER): Payer: Self-pay | Admitting: Family Medicine

## 2021-06-27 DIAGNOSIS — E559 Vitamin D deficiency, unspecified: Secondary | ICD-10-CM

## 2021-07-10 ENCOUNTER — Other Ambulatory Visit: Payer: Self-pay

## 2021-07-10 ENCOUNTER — Encounter (INDEPENDENT_AMBULATORY_CARE_PROVIDER_SITE_OTHER): Payer: Self-pay | Admitting: Family Medicine

## 2021-07-10 ENCOUNTER — Other Ambulatory Visit (INDEPENDENT_AMBULATORY_CARE_PROVIDER_SITE_OTHER): Payer: Self-pay | Admitting: Family Medicine

## 2021-07-10 ENCOUNTER — Ambulatory Visit (INDEPENDENT_AMBULATORY_CARE_PROVIDER_SITE_OTHER): Payer: BC Managed Care – PPO | Admitting: Family Medicine

## 2021-07-10 VITALS — BP 112/71 | HR 68 | Temp 98.4°F | Ht 63.0 in | Wt 196.0 lb

## 2021-07-10 DIAGNOSIS — R7303 Prediabetes: Secondary | ICD-10-CM

## 2021-07-10 DIAGNOSIS — E559 Vitamin D deficiency, unspecified: Secondary | ICD-10-CM | POA: Diagnosis not present

## 2021-07-10 DIAGNOSIS — I10 Essential (primary) hypertension: Secondary | ICD-10-CM | POA: Diagnosis not present

## 2021-07-10 DIAGNOSIS — E669 Obesity, unspecified: Secondary | ICD-10-CM | POA: Diagnosis not present

## 2021-07-10 DIAGNOSIS — Z6834 Body mass index (BMI) 34.0-34.9, adult: Secondary | ICD-10-CM

## 2021-07-10 DIAGNOSIS — Z9189 Other specified personal risk factors, not elsewhere classified: Secondary | ICD-10-CM

## 2021-07-10 MED ORDER — OLMESARTAN MEDOXOMIL 5 MG PO TABS: 10.0000 mg | ORAL_TABLET | Freq: Every day | ORAL | 0 refills | Status: DC

## 2021-07-10 MED ORDER — VITAMIN D (ERGOCALCIFEROL) 1.25 MG (50000 UNIT) PO CAPS: 50000.0000 [IU] | ORAL_CAPSULE | ORAL | 0 refills | Status: DC

## 2021-07-10 NOTE — Telephone Encounter (Signed)
Dr.Opalski ?

## 2021-07-11 NOTE — Progress Notes (Signed)
Chief Complaint:   OBESITY Madison Delgado is here to discuss her progress with her obesity treatment plan along with follow-up of her obesity related diagnoses. Madison Delgado is on the Category 2 Plan and states she is following her eating plan approximately 20% of the time. Madison Delgado states she is walking on the treadmill for 30 minutes 5 times per week.  Today's visit was #: 15 Starting weight: 199 lbs Starting date: 08/03/2020 Today's weight: 196 lbs Today's date: 07/10/2021 Total lbs lost to date: 3 Total lbs lost since last in-office visit: 0  Interim History: Madison Delgado was in [place] with her daughter, and she ate and drank what she wanted to and she is happy she didn't gain. She has no travel plans in the near future. Labs done on 06/05/2021 with PCP, reviewed with the patient CMP, TSH, FLP, A1c, Vit D, CBC, and they were done at [facility] and results in chart.  Subjective:   1. Vitamin D deficiency Madison Delgado's Vit D was 51.1 on 06/05/2021 with her PCP's office.   2. Essential hypertension Madison Delgado is tolerating medication(s) well without side effects.  Medication compliance is good and patient appears to be taking it as prescribed.  The patient denies additional concerns regarding this condition. CMP at PCP's office on 06/05/2021 with normal serum creatinine, Na 144, and at home blood pressure 110's/70-80's.   3. Pre-diabetes Madison Delgado's A1c was 6.0 on 06/05/2021 at her PCP's office. No carbohydrate cravings or hunger persay, when eating on the plan.  4. At risk for dehydration Madison Delgado is at risk for dehydration due to inadequate water intake.  Assessment/Plan:  No orders of the defined types were placed in this encounter.   Medications Discontinued During This Encounter  Medication Reason   olmesartan (BENICAR) 5 MG tablet Reorder   Vitamin D, Ergocalciferol, (DRISDOL) 1.25 MG (50000 UNIT) CAPS capsule Reorder     Meds ordered this encounter  Medications   olmesartan (BENICAR) 5 MG tablet     Sig: Take 2 tablets (10 mg total) by mouth daily.    Dispense:  60 tablet    Refill:  0    30 d supply;  ** OV for RF **   Do not send RF request   Vitamin D, Ergocalciferol, (DRISDOL) 1.25 MG (50000 UNIT) CAPS capsule    Sig: Take 1 capsule (50,000 Units total) by mouth every 7 (seven) days.    Dispense:  4 capsule    Refill:  0    Ov for RF     1. Vitamin D deficiency Vit D level at goal. Madison Delgado will continue her medication.  - I again reiterated the importance of vitamin D (as well as calcium) to their health and wellbeing.  - I reviewed possible symptoms of low Vitamin D:  low energy, depressed mood, muscle aches, joint aches, osteoporosis etc. - low Vitamin D levels may be linked to an increased risk of cardiovascular events and even increased risk of cancers- such as colon and breast.  - ideal vitamin D levels reviewed with patient  - I recommend pt take a 50,000 IU weekly prescription vit D - see script below   - Informed patient this may be a lifelong thing, and she was encouraged to continue to take the medicine until told otherwise.    - weight loss will likely improve availability of vitamin D, thus encouraged Madison Delgado to continue with meal plan and their weight loss efforts to further improve this condition.  Thus, we will need to  monitor levels regularly (every 3-4 mo on average) to keep levels within normal limits and prevent over supplementation. - pt's questions and concerns regarding this condition addressed.  - Vitamin D, Ergocalciferol, (DRISDOL) 1.25 MG (50000 UNIT) CAPS capsule; Take 1 capsule (50,000 Units total) by mouth every 7 (seven) days.  Dispense: 4 capsule; Refill: 0  2. Essential hypertension Madison Delgado will increase water and decrease salt. Blood pressure at goal today. Continue home blood pressure monitoring especially if she gets any bouts of dizziness like she had in the past. We will refill Benicar at current dose for 1 month.  - olmesartan (BENICAR) 5 MG  tablet; Take 2 tablets (10 mg total) by mouth daily.  Dispense: 60 tablet; Refill: 0  3. Pre-diabetes I reiterated and again counseled patient on pathophysiology of the disease process of  Pre-DM.   Stressed importance of dietary and lifestyle modifications to result in weight loss as first line txmnt In addition, we discussed the risks and benefits of various medication options which can help [name] in the management of this disease process as well as with weight loss.  Will consider starting one of these meds in future, or a dose adjustment in the future, as we will focus on prudent nutritional plan at this time.  Continue to decrease simple carbs; increase fiber and proteins -> follow meal plan  Handouts provided at pt's request after education provided.  All concerns/questions addressed.   Anticipatory guidance given.   - We will recheck A1c and fasting insulin level in approximately 3 months from last check, or as deemed appropriate.   4. At risk for dehydration Madison Delgado was given approximately 9 minutes of dehydration prevention counseling today. Madison Delgado is at risk for dehydration due to weight loss and current medication(s). She was encouraged to hydrate and monitor fluid status to avoid dehydration as weight loss plateaus.  5. Obesity with current BMI of 34.7 Madison Delgado is currently in the action stage of change. As such, her goal is to continue with weight loss efforts. She has agreed to the Category 2 Plan.   Exercise goals: For substantial health benefits, adults should do at least 150 minutes (2 hours and 30 minutes) a week of moderate-intensity, or 75 minutes (1 hour and 15 minutes) a week of vigorous-intensity aerobic physical activity, or an equivalent combination of moderate- and vigorous-intensity aerobic activity. Aerobic activity should be performed in episodes of at least 10 minutes, and preferably, it should be spread throughout the week.  Behavioral modification strategies:  increasing water intake, decreasing liquid calories, and avoiding temptations.  Madison Delgado has agreed to follow-up with our clinic in 3 weeks. She was informed of the importance of frequent follow-up visits to maximize her success with intensive lifestyle modifications for her multiple health conditions.   Objective:   Blood pressure 112/71, pulse 68, temperature 98.4 F (36.9 C), height 5\' 3"  (1.6 m), weight 196 lb (88.9 kg), SpO2 99 %. Body mass index is 34.72 kg/m.  General: Cooperative, alert, well developed, in no acute distress. HEENT: Conjunctivae and lids unremarkable. Cardiovascular: Regular rhythm.  Lungs: Normal work of breathing. Neurologic: No focal deficits.   Lab Results  Component Value Date   CREATININE 0.76 04/03/2021   BUN 18 04/03/2021   NA 143 04/03/2021   K 4.3 04/03/2021   CL 103 04/03/2021   CO2 25 04/03/2021   Lab Results  Component Value Date   ALT 25 04/03/2021   AST 19 04/03/2021   ALKPHOS 51 04/03/2021   BILITOT <  0.2 04/03/2021   Lab Results  Component Value Date   HGBA1C 5.7 (H) 04/03/2021   HGBA1C 5.7 (H) 12/05/2020   HGBA1C 6.1 (H) 08/03/2020   Lab Results  Component Value Date   INSULIN 9.2 08/03/2020   Lab Results  Component Value Date   TSH 0.540 08/03/2020   Lab Results  Component Value Date   CHOL 163 08/03/2020   HDL 54 08/03/2020   LDLCALC 90 08/03/2020   TRIG 103 08/03/2020   CHOLHDL 3.0 08/03/2020   Lab Results  Component Value Date   VD25OH 61.5 04/03/2021   VD25OH 65.3 12/05/2020   VD25OH 34.3 08/03/2020   Lab Results  Component Value Date   WBC 4.6 08/03/2020   HGB 13.3 08/03/2020   HCT 41.1 08/03/2020   MCV 83 08/03/2020   PLT 275 08/03/2020   No results found for: IRON, TIBC, FERRITIN  Attestation Statements:   Reviewed by clinician on day of visit: allergies, medications, problem list, medical history, surgical history, family history, social history, and previous encounter notes.   Wilhemena Durie, am acting as transcriptionist for [employer], DO.  I have reviewed the above documentation for accuracy and completeness, and I agree with the above. Marjory Sneddon, D.O.  The [place] was signed into law in 2016 which includes the topic of electronic health records.  This provides immediate access to information in MyChart.  This includes consultation notes, operative notes, office notes, lab results and pathology reports.  If you have any questions about what you read please let us know at your next visit so we can discuss your concerns and take corrective action if need be.  We are right here with you.

## 2021-07-12 ENCOUNTER — Other Ambulatory Visit (INDEPENDENT_AMBULATORY_CARE_PROVIDER_SITE_OTHER): Payer: Self-pay | Admitting: Family Medicine

## 2021-07-12 DIAGNOSIS — E559 Vitamin D deficiency, unspecified: Secondary | ICD-10-CM

## 2021-08-01 ENCOUNTER — Encounter (INDEPENDENT_AMBULATORY_CARE_PROVIDER_SITE_OTHER): Payer: Self-pay | Admitting: Family Medicine

## 2021-08-01 ENCOUNTER — Other Ambulatory Visit: Payer: Self-pay

## 2021-08-01 ENCOUNTER — Other Ambulatory Visit (INDEPENDENT_AMBULATORY_CARE_PROVIDER_SITE_OTHER): Payer: Self-pay | Admitting: Family Medicine

## 2021-08-01 ENCOUNTER — Ambulatory Visit (INDEPENDENT_AMBULATORY_CARE_PROVIDER_SITE_OTHER): Payer: BC Managed Care – PPO | Admitting: Family Medicine

## 2021-08-01 VITALS — BP 125/79 | HR 68 | Temp 98.0°F | Ht 63.0 in | Wt 196.0 lb

## 2021-08-01 DIAGNOSIS — Z9189 Other specified personal risk factors, not elsewhere classified: Secondary | ICD-10-CM | POA: Diagnosis not present

## 2021-08-01 DIAGNOSIS — R7303 Prediabetes: Secondary | ICD-10-CM

## 2021-08-01 DIAGNOSIS — E538 Deficiency of other specified B group vitamins: Secondary | ICD-10-CM | POA: Diagnosis not present

## 2021-08-01 DIAGNOSIS — E559 Vitamin D deficiency, unspecified: Secondary | ICD-10-CM

## 2021-08-01 DIAGNOSIS — Z6834 Body mass index (BMI) 34.0-34.9, adult: Secondary | ICD-10-CM

## 2021-08-01 DIAGNOSIS — E669 Obesity, unspecified: Secondary | ICD-10-CM

## 2021-08-01 DIAGNOSIS — I10 Essential (primary) hypertension: Secondary | ICD-10-CM

## 2021-08-01 MED ORDER — OLMESARTAN MEDOXOMIL 5 MG PO TABS: 10.0000 mg | ORAL_TABLET | Freq: Every day | ORAL | 0 refills | Status: DC

## 2021-08-01 MED ORDER — VITAMIN D (ERGOCALCIFEROL) 1.25 MG (50000 UNIT) PO CAPS: 50000.0000 [IU] | ORAL_CAPSULE | ORAL | 0 refills | Status: DC

## 2021-08-07 NOTE — Progress Notes (Signed)
? ? ? ?Chief Complaint:  ? ?OBESITY ?Madison Delgado is here to discuss her progress with her obesity treatment plan along with follow-up of her obesity related diagnoses. Madison Delgado is on the Category 2 Plan and states she is following her eating plan approximately 30% of the time. Madison Delgado states she is on the treadmill for 30 minutes 1 time per week. ? ?Today's visit was #: 16 ?Starting weight: 199 lbs ?Starting date: 08/03/2020 ?Today's weight: 196 lbs ?Today's date: 08/01/2021 ?Total lbs lost to date: 3 ?Total lbs lost since last in-office visit: 0 ? ?Interim History: Madison Delgado is meal prepping  and planning more. She is still drinking ETOH on weekends at times which she goes well over on her snack calories. ? ?Subjective:  ? ?1. Essential hypertension ?Madison Delgado is tolerating medication(s) well without side effects.  Medication compliance is good and patient appears to be taking it as prescribed.  The patient denies additional concerns regarding this condition.  ? ?2. Vitamin D deficiency ?Madison Delgado is currently taking prescription vitamin D 50,000 IU each week. Her compliance is good. Last Vit D level was 51 on 06/05/2021 with her PCP. She denies nausea, vomiting or muscle weakness. ? ?3. B12 deficiency ?Madison Delgado's energy level is "so, so". She is taking 250 mcg OTC. ? ?4. Pre-diabetes ?Madison Delgado's last A1c was 6.0 on 06/05/2021 with her PCP.  ? ?5. At risk for malnutrition ?Madison Delgado is at increased risk for malnutrition due to current nutritional intake. ? ?Assessment/Plan:  ?No orders of the defined types were placed in this encounter. ? ? ?Medications Discontinued During This Encounter  ?Medication Reason  ? olmesartan (BENICAR) 5 MG tablet Reorder  ? Vitamin D, Ergocalciferol, (DRISDOL) 1.25 MG (50000 UNIT) CAPS capsule Reorder  ?  ? ?Meds ordered this encounter  ?Medications  ? olmesartan (BENICAR) 5 MG tablet  ?  Sig: Take 2 tablets (10 mg total) by mouth daily.  ?  Dispense:  60 tablet  ?  Refill:  0  ?  30 d supply;  ** OV  for RF **   Do not send RF request  ? Vitamin D, Ergocalciferol, (DRISDOL) 1.25 MG (50000 UNIT) CAPS capsule  ?  Sig: Take 1 capsule (50,000 Units total) by mouth every 7 (seven) days.  ?  Dispense:  4 capsule  ?  Refill:  0  ?  Ov for RF  ?  ? ?1. Essential hypertension ?Madison Delgado's blood pressure is at goal today, stable. We will refill olmesartan for 1 month. ? ?- olmesartan (BENICAR) 5 MG tablet; Take 2 tablets (10 mg total) by mouth daily.  Dispense: 60 tablet; Refill: 0 ? ?2. Vitamin D deficiency ?We will refill prescription Vit D for 1 month. ? ?- Vitamin D, Ergocalciferol, (DRISDOL) 1.25 MG (50000 UNIT) CAPS capsule; Take 1 capsule (50,000 Units total) by mouth every 7 (seven) days.  Dispense: 4 capsule; Refill: 0 ? ?3. B12 deficiency ?The diagnosis was reviewed with the patient. Counseling provided today, see below. Madison Delgado will continues B12 supplement. Orders and follow up as documented in patient record. ? ?Counseling ?The body needs vitamin B12: to make red blood cells; to make DNA; and to help the nerves work properly so they can carry messages from the brain to the body.  ?The main causes of vitamin B12 deficiency include dietary deficiency, digestive diseases, pernicious anemia, and having a surgery in which part of the stomach or small intestine is removed.  ?Certain medicines can make it harder for the body to absorb vitamin B12.  These medicines include: heartburn medications; some antibiotics; some medications used to treat diabetes, gout, and high cholesterol.  ?In some cases, there are no symptoms of this condition. If the condition leads to anemia or nerve damage, various symptoms can occur, such as weakness or fatigue, shortness of breath, and numbness or tingling in your hands and feet.   ?Treatment:  ?May include taking vitamin B12 supplements.  ?Avoid alcohol.  ?Eat lots of healthy foods that contain vitamin B12: ?Beef, pork, chicken, [place], and organ meats, such as liver.  ?Seafood: This  includes clams, rainbow trout, salmon, tuna, and haddock. Eggs.  ?Cereal and dairy products that are fortified: This means that vitamin B12 has been added to the food.  ? ?4. Pre-diabetes ?Madison Delgado declines metformin today, and we will follow up at her next office visit. ? ?5. At risk for malnutrition ?Madison Delgado was given approximately 9 minutes of counseling today regarding prevention of malnutrition and ways to meet macronutrient goals..  ? ?6. Obesity with current BMI of 34.9 ?Madison Delgado is currently in the action stage of change. As such, her goal is to continue with weight loss efforts. She has agreed to the Category 2 Plan with lunch options.  ? ?Exercise goals: As is. ? ?Behavioral modification strategies: increasing lean protein intake, decreasing simple carbohydrates, and planning for success. ? ?Madison Delgado has agreed to follow-up with our clinic in 3 weeks. She was informed of the importance of frequent follow-up visits to maximize her success with intensive lifestyle modifications for her multiple health conditions.  ? ?Objective:  ? ?Blood pressure 125/79, pulse 68, temperature 98 ?F (36.7 ?C), height '5\' 3"'$  (1.6 m), weight 196 lb (88.9 kg), SpO2 100 %. ?Body mass index is 34.72 kg/m?. ? ?General: Cooperative, alert, well developed, in no acute distress. ?HEENT: Conjunctivae and lids unremarkable. ?Cardiovascular: Regular rhythm.  ?Lungs: Normal work of breathing. ?Neurologic: No focal deficits.  ? ?Lab Results  ?Component Value Date  ? CREATININE 0.76 04/03/2021  ? BUN 18 04/03/2021  ? NA 143 04/03/2021  ? K 4.3 04/03/2021  ? CL 103 04/03/2021  ? CO2 25 04/03/2021  ? ?Lab Results  ?Component Value Date  ? ALT 25 04/03/2021  ? AST 19 04/03/2021  ? ALKPHOS 51 04/03/2021  ? BILITOT <0.2 04/03/2021  ? ?Lab Results  ?Component Value Date  ? HGBA1C 5.7 (H) 04/03/2021  ? HGBA1C 5.7 (H) 12/05/2020  ? HGBA1C 6.1 (H) 08/03/2020  ? ?Lab Results  ?Component Value Date  ? INSULIN 9.2 08/03/2020  ? ?Lab Results  ?Component  Value Date  ? TSH 0.540 08/03/2020  ? ?Lab Results  ?Component Value Date  ? CHOL 163 08/03/2020  ? HDL 54 08/03/2020  ? [place] 90 08/03/2020  ? TRIG 103 08/03/2020  ? CHOLHDL 3.0 08/03/2020  ? ?Lab Results  ?Component Value Date  ? VD25OH 61.5 04/03/2021  ? VD25OH 65.3 12/05/2020  ? VD25OH 34.3 08/03/2020  ? ?Lab Results  ?Component Value Date  ? WBC 4.6 08/03/2020  ? HGB 13.3 08/03/2020  ? HCT 41.1 08/03/2020  ? MCV 83 08/03/2020  ? PLT 275 08/03/2020  ? ?No results found for: IRON, TIBC, FERRITIN ? ?Attestation Statements:  ? ?Reviewed by clinician on day of visit: allergies, medications, problem list, medical history, surgical history, family history, social history, and previous encounter notes. ? ? ?I, Trixie Dredge, am acting as transcriptionist for [employer], DO. ? ?I have reviewed the above documentation for accuracy and completeness, and I agree with the above. -  Marjory Sneddon, D.O. ? ?[place] was signed into law in 2016 which includes the topic of electronic health records.  This provides immediate access to information in MyChart.  This includes consultation notes, operative notes, office notes, lab results and pathology reports.  If you have any questions about what you read please let us know at your next visit so we can discuss your concerns and take corrective action if need be.  We are right here with you. ? ? ?

## 2021-08-08 NOTE — Progress Notes (Signed)
?  ?Cardiology Office Note ? ? ?Date:  08/09/2021  ? ?ID:  Madison Delgado, DOB [birth year], MRN [id number] ? ?PCP:  Wenda Low, MD  ? ? ?Chief Complaint  ?Patient presents with  ? Follow-up  ? ?palpitations ? ?Wt Readings from Last 3 Encounters:  ?08/09/21 198 lb (89.8 kg)  ?08/01/21 196 lb (88.9 kg)  ?07/10/21 196 lb (88.9 kg)  ?  ? ?  ?History of Present Illness: ?Madison Delgado is a 61 y.o. female  with history of inappropriate sinus tachycardia diagnosed at [facility] treated with beta-blocker, normal LV function on 2D echo 2013, hypertension, HLD, obesity. Normal GXT 2017. ?  ?Son is an Chief Financial Officer at [employer] in [place], [place].  ?  ?Palpitations have been worse with stress in the past.  She has tried magnesium without any relief. ?  ?LVEF normal.  Minimally elevated PA pressure in December 2019. ?  ?Anxiety increased when she had to go back to work.  She is trying to avoid Xanax use.   ?  ?She has had a sleep study recommended  In the past but she was too claustrophobic and did not have the test. ?  ?Graduated Masters program in Dec 2020.  ?  ?Seen in ER in 11/21 for what sounded like panic attack. Normal labs.  Negative cardiac w/u. She was given anxiolytic, Xanax.  ? ?Now worked at [employer].   ? ?Denies : Chest pain. Dizziness. Leg edema. Nitroglycerin use. Orthopnea. Paroxysmal nocturnal dyspnea. Shortness of breath. Syncope.   ? ?Minimal palpitations.  Anxiety improved after starting the Pristiq. Now off  Xanax. ? ?BP has improved. ARB decreased.  ? ?Walking 5 days a week, 45 minutes at a time.  ? ? ? ?Past Medical History:  ?Diagnosis Date  ? Anxiety   ? Fibroids 04/22/2013  ? History of stress test   ? a. ETT 5/17: normal  ? Hyperlipidemia   ? Hypertension   ? Joint pain   ? Obesity   ? Palpitations   ? Postmenopausal bleeding 04/22/2013  ? Prediabetes   ? S/P hysterectomy 04/22/2013  ? Seasonal and perennial allergic rhinitis 03/18/2017  ? SOBOE (shortness of breath on exertion)    ? ? ?Past Surgical History:  ?Procedure Laterality Date  ? ADENOIDECTOMY    ? BILATERAL SALPINGECTOMY Bilateral 04/22/2013  ? Procedure: BILATERAL SALPINGECTOMY;  Surgeon: Elveria Royals, MD;  Location: [facility];  Service: Gynecology;  Laterality: Bilateral;  ? CARPAL TUNNEL RELEASE    ? CATARACT EXTRACTION  04/2016  ? ROBOTIC ASSISTED TOTAL HYSTERECTOMY N/A 04/22/2013  ? Procedure: ROBOTIC ASSISTED TOTAL HYSTERECTOMY;  Surgeon: Elveria Royals, MD;  Location: [facility];  Service: Gynecology;  Laterality: N/A;  3 1/2 hrs.  ? TONSILLECTOMY    ? WISDOM TOOTH EXTRACTION    ? ? ? ?Current Outpatient Medications  ?Medication Sig Dispense Refill  ? ALPRAZolam (XANAX) 0.5 MG tablet Take 1 tablet (0.5 mg total) by mouth 2 (two) times daily as needed for anxiety. 6 tablet 0  ? atenolol (TENORMIN) 50 MG tablet Take 1 tablet by mouth daily, may take additioinal 1/2 tablet daily as needed for palpitations 110 tablet 2  ? Desvenlafaxine Succinate ER 25 MG TB24 Take 25 mg by mouth daily.    ? MAGNESIUM PO Take 1 tablet by mouth daily with breakfast. 250 mg    ? Multiple Vitamins-Calcium (ONE-A-DAY WOMENS FORMULA PO) Take 1 tablet by mouth daily with breakfast.    ? olmesartan (  BENICAR) 5 MG tablet Take 2 tablets (10 mg total) by mouth daily. 60 tablet 0  ? vitamin B-12 (CYANOCOBALAMIN) 250 MCG tablet Take 250 mcg by mouth daily.    ? Vitamin D, Ergocalciferol, (DRISDOL) 1.25 MG (50000 UNIT) CAPS capsule Take 1 capsule (50,000 Units total) by mouth every 7 (seven) days. 4 capsule 0  ? ?No current facility-administered medications for this visit.  ? ? ?Allergies:   Pork allergy, Duloxetine hcl, and Other  ? ? ?Social History:  The patient  reports that she quit smoking about 23 years ago. Her smoking use included cigarettes. She has never used smokeless tobacco. She reports that she does not drink alcohol and does not use drugs.  ? ?Family History:  The patient's family history includes Asthma in her sister; Coronary artery disease in  her father; Diabetes in her mother; Heart attack in her father; Hypertension in her mother and sister; Stroke in her mother.  ? ? ?ROS:  Please see the history of present illness.   Otherwise, review of systems are positive for intentional weight loss.   All other systems are reviewed and negative.  ? ? ?PHYSICAL EXAM: ?VS:  BP 114/70   Pulse 67   Ht '5\' 3"'$  (1.6 m)   Wt 198 lb (89.8 kg)   SpO2 99%   BMI 35.07 kg/m?  , BMI Body mass index is 35.07 kg/m?. ?GEN: Well nourished, well developed, in no acute distress ?HEENT: normal ?Neck: no JVD, carotid bruits, or masses ?Cardiac: RRR; no murmurs, rubs, or gallops,no edema  ?Respiratory:  clear to auscultation bilaterally, normal work of breathing ?GI: soft, nontender, nondistended, + BS ?MS: no deformity or atrophy ?Skin: warm and dry, no rash ?Neuro:  Strength and sensation are intact ?Psych: euthymic mood, full affect ? ? ?EKG:   ?The ekg ordered today demonstrates normal ECG ? ? ?Recent Labs: ?04/03/2021: ALT 25; BUN 18; Creatinine, Ser 0.76; Potassium 4.3; Sodium 143  ? ?Lipid Panel ?   ?Component Value Date/Time  ? CHOL 163 08/03/2020 0852  ? TRIG 103 08/03/2020 0852  ? HDL 54 08/03/2020 0852  ? CHOLHDL 3.0 08/03/2020 0852  ? [place] 90 08/03/2020 0852  ? ?  ?Other studies Reviewed: ?Additional studies/ records that were reviewed today with results demonstrating: Labs reviewed. ? ? ?ASSESSMENT AND PLAN: ? ?Palpitations: improved.  Continue atenolol.  Improved since her stress levels have decreased. ?Hypertension: The current medical regimen is effective;  continue present plan and medications.  Reducing medicines at this point. ?Leg swelling: Improved.  Better with exercise.  Elevating the legs will help.  Trying to eat healthy and avoid processed foods.  Avoiding excess salt. ?Hyperlipidemia: LDL 69 HDL 57 triglycerides 138 total cholesterol 149 in January 2023. ?Observed apnea: Noted in the past.  Never had sleep study but sx can improve just with weight  loss.  Snoring persists.  Some morning fatigue.  She will continue to monitor. ? ? ?Current medicines are reviewed at length with the patient today.  The patient concerns regarding her medicines were addressed. ? ?The following changes have been made:  No change ? ?Labs/ tests ordered today include:  ?No orders of the defined types were placed in this encounter. ? ? ?Recommend 150 minutes/week of aerobic exercise ?Low fat, low carb, high fiber diet recommended ? ?Disposition:   FU in 1 year ? ? ?Signed, ?Larae Grooms, MD  ?08/09/2021 8:37 AM    ?[facility] ?[place], [place]  [address] ?Phone: [phone]; Fax: [phone]  ? ?

## 2021-08-09 ENCOUNTER — Ambulatory Visit: Payer: BC Managed Care – PPO | Admitting: Interventional Cardiology

## 2021-08-09 ENCOUNTER — Other Ambulatory Visit: Payer: Self-pay

## 2021-08-09 ENCOUNTER — Encounter: Payer: Self-pay | Admitting: Interventional Cardiology

## 2021-08-09 VITALS — BP 114/70 | HR 67 | Ht 63.0 in | Wt 198.0 lb

## 2021-08-09 DIAGNOSIS — I10 Essential (primary) hypertension: Secondary | ICD-10-CM | POA: Diagnosis not present

## 2021-08-09 DIAGNOSIS — G4730 Sleep apnea, unspecified: Secondary | ICD-10-CM | POA: Diagnosis not present

## 2021-08-09 DIAGNOSIS — E782 Mixed hyperlipidemia: Secondary | ICD-10-CM

## 2021-08-09 DIAGNOSIS — R000 Tachycardia, unspecified: Secondary | ICD-10-CM | POA: Diagnosis not present

## 2021-08-09 DIAGNOSIS — R002 Palpitations: Secondary | ICD-10-CM | POA: Diagnosis not present

## 2021-08-09 NOTE — Patient Instructions (Signed)

## 2021-08-22 ENCOUNTER — Ambulatory Visit (INDEPENDENT_AMBULATORY_CARE_PROVIDER_SITE_OTHER): Payer: BC Managed Care – PPO | Admitting: Family Medicine

## 2021-09-18 ENCOUNTER — Ambulatory Visit (INDEPENDENT_AMBULATORY_CARE_PROVIDER_SITE_OTHER): Payer: BC Managed Care – PPO | Admitting: Family Medicine

## 2021-11-20 DIAGNOSIS — R0981 Nasal congestion: Secondary | ICD-10-CM | POA: Diagnosis not present

## 2021-11-20 DIAGNOSIS — R059 Cough, unspecified: Secondary | ICD-10-CM | POA: Diagnosis not present

## 2021-11-27 DIAGNOSIS — H10413 Chronic giant papillary conjunctivitis, bilateral: Secondary | ICD-10-CM | POA: Diagnosis not present

## 2021-11-27 DIAGNOSIS — H2512 Age-related nuclear cataract, left eye: Secondary | ICD-10-CM | POA: Diagnosis not present

## 2021-11-27 DIAGNOSIS — H43813 Vitreous degeneration, bilateral: Secondary | ICD-10-CM | POA: Diagnosis not present

## 2021-11-27 DIAGNOSIS — R7309 Other abnormal glucose: Secondary | ICD-10-CM | POA: Diagnosis not present

## 2021-12-06 DIAGNOSIS — E041 Nontoxic single thyroid nodule: Secondary | ICD-10-CM | POA: Diagnosis not present

## 2021-12-06 DIAGNOSIS — F419 Anxiety disorder, unspecified: Secondary | ICD-10-CM | POA: Diagnosis not present

## 2021-12-06 DIAGNOSIS — J309 Allergic rhinitis, unspecified: Secondary | ICD-10-CM | POA: Diagnosis not present

## 2021-12-06 DIAGNOSIS — I10 Essential (primary) hypertension: Secondary | ICD-10-CM | POA: Diagnosis not present

## 2021-12-20 ENCOUNTER — Encounter (INDEPENDENT_AMBULATORY_CARE_PROVIDER_SITE_OTHER): Payer: Self-pay

## 2022-02-16 IMAGING — MG MM DIGITAL SCREENING BILAT W/ TOMO AND CAD
6 of 10 series · 6 of 30 positions shown · non-contrast
Comparison: Previous exam(s).

CLINICAL DATA: Screening.

EXAM:
DIGITAL SCREENING BILATERAL MAMMOGRAM WITH TOMOSYNTHESIS AND CAD
TECHNIQUE: Bilateral screening digital craniocaudal and mediolateral oblique
mammograms were obtained. Bilateral screening digital breast
tomosynthesis was performed. The images were evaluated with
computer-aided detection.

[L CC synth-2D]
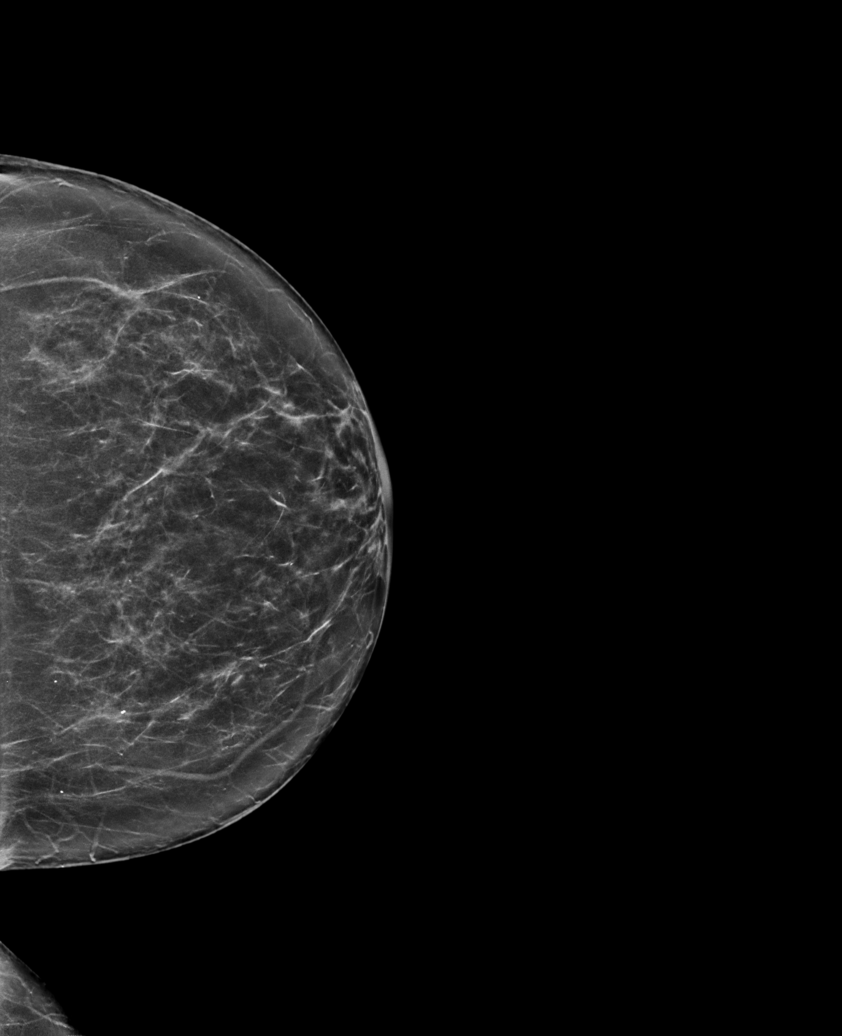

[L MLO synth-2D]
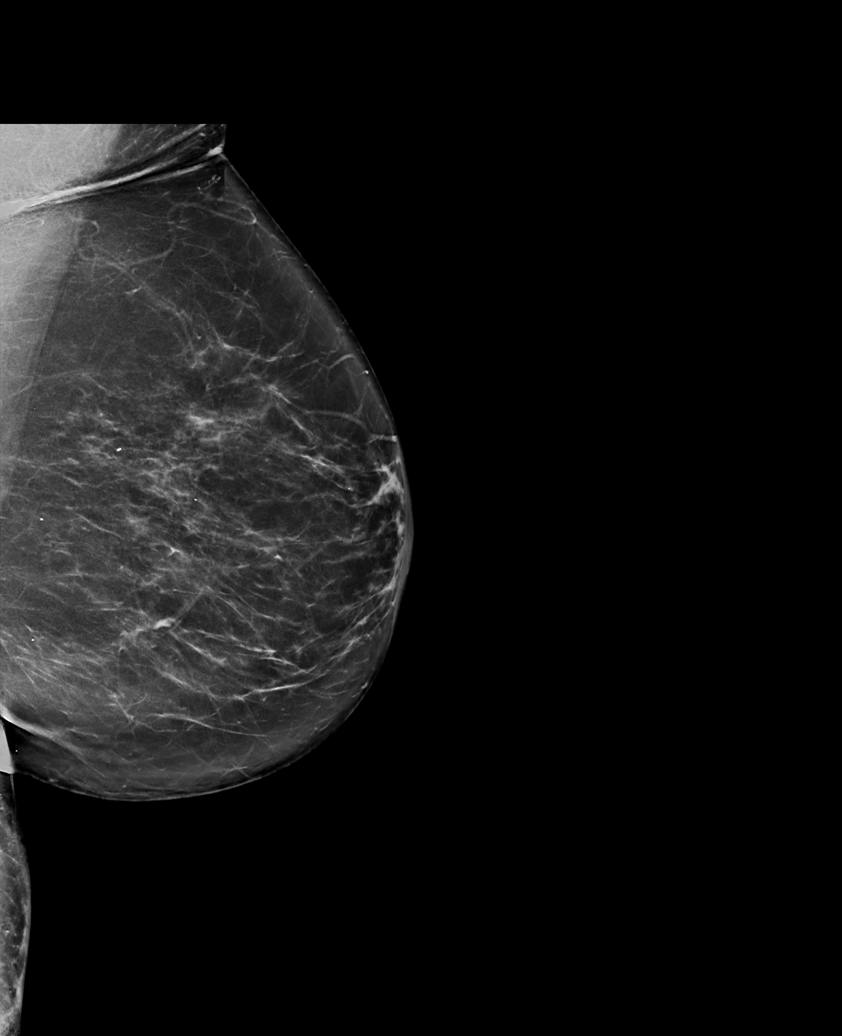

[R CC synth-2D]
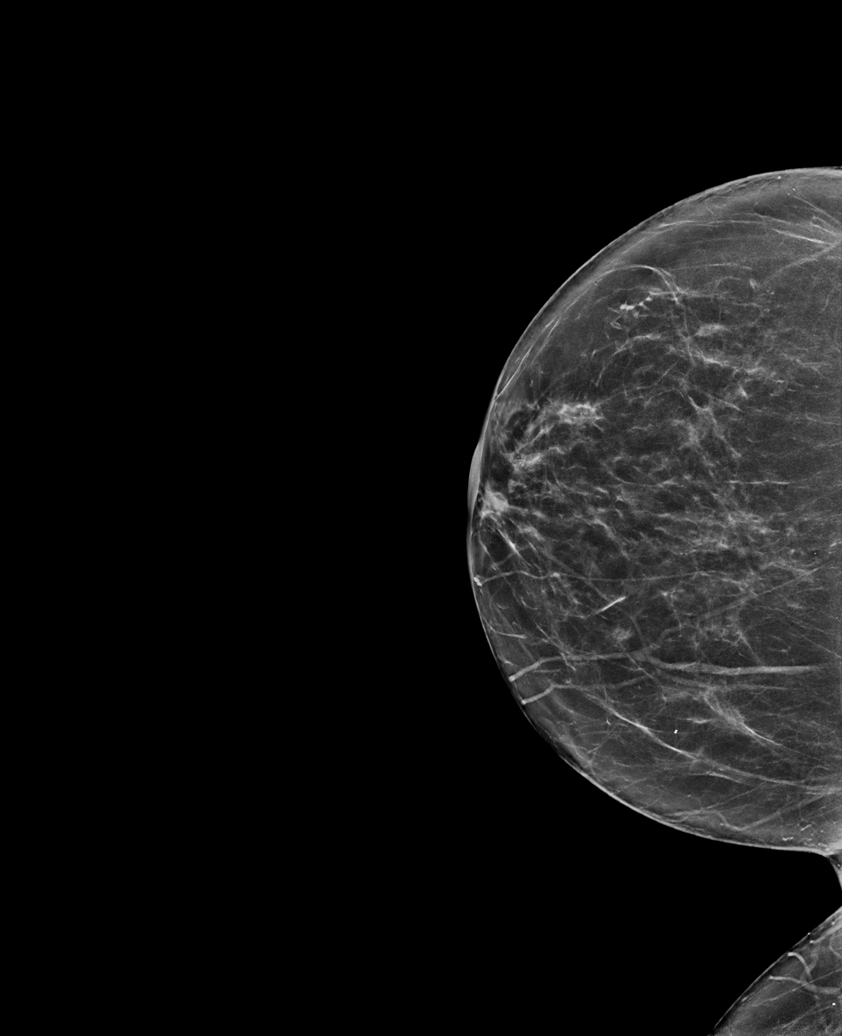

[R MLO synth-2D (1 of 2)]
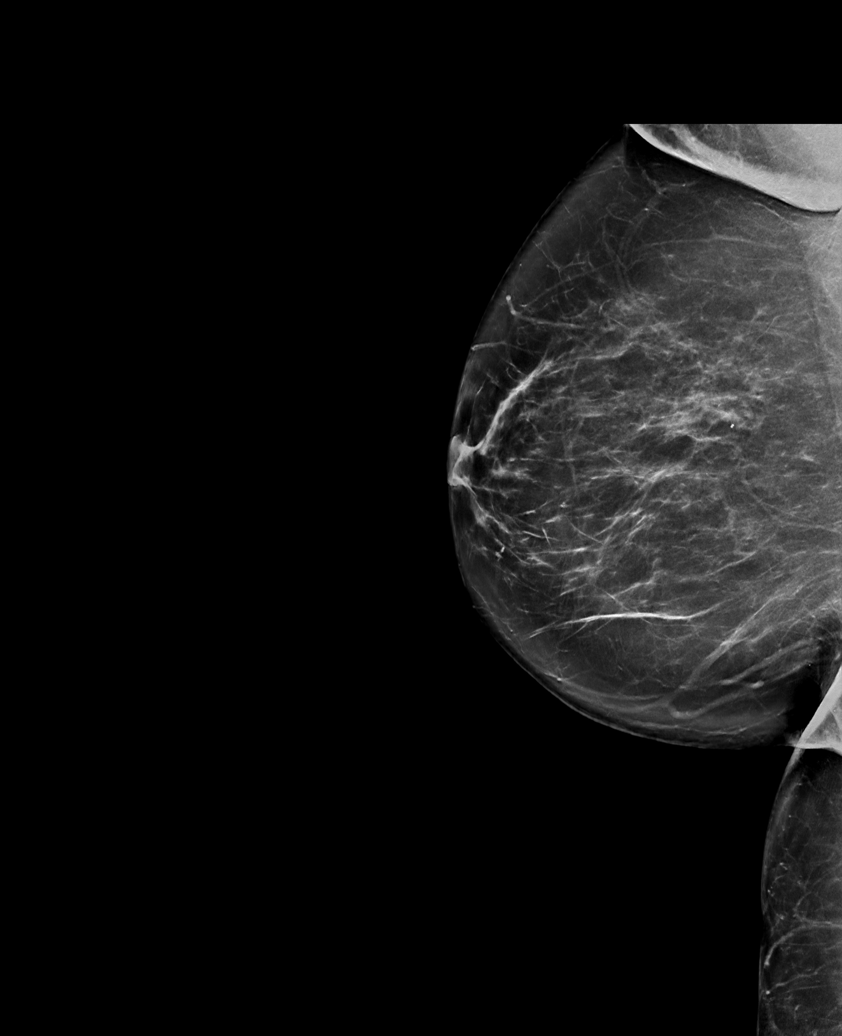

[R MLO synth-2D (2 of 2)]
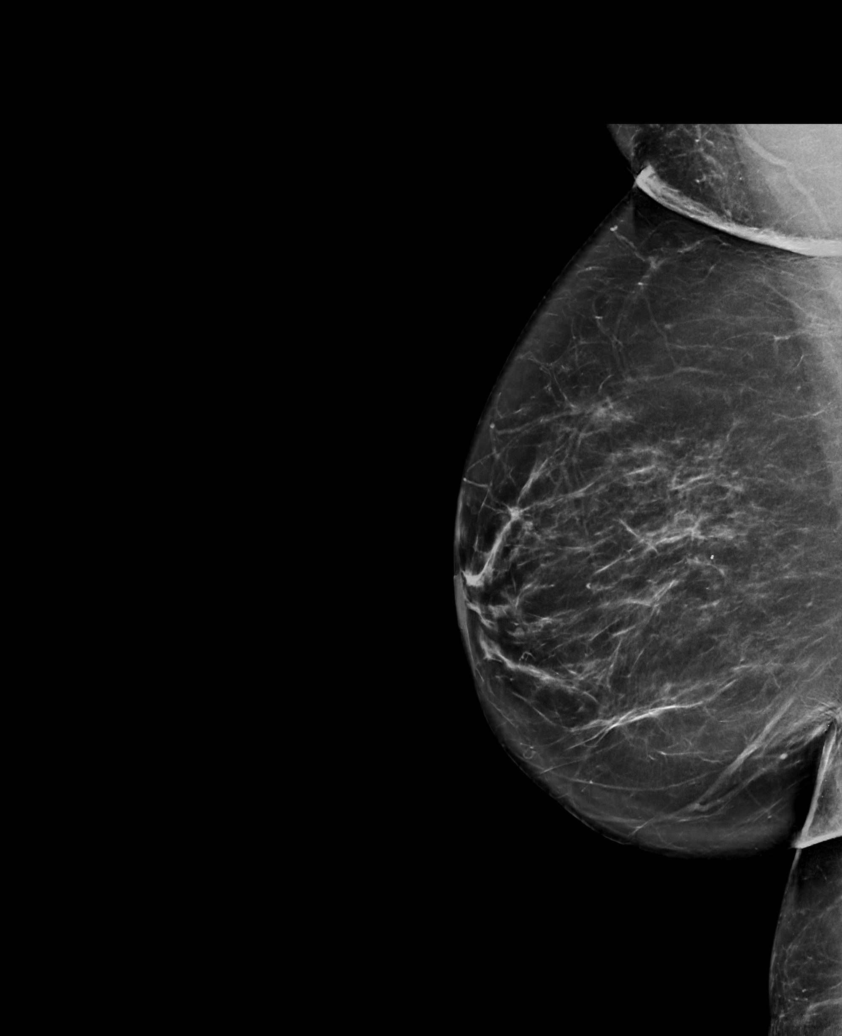

[R CC tomo · tomo slice 35/70.0]
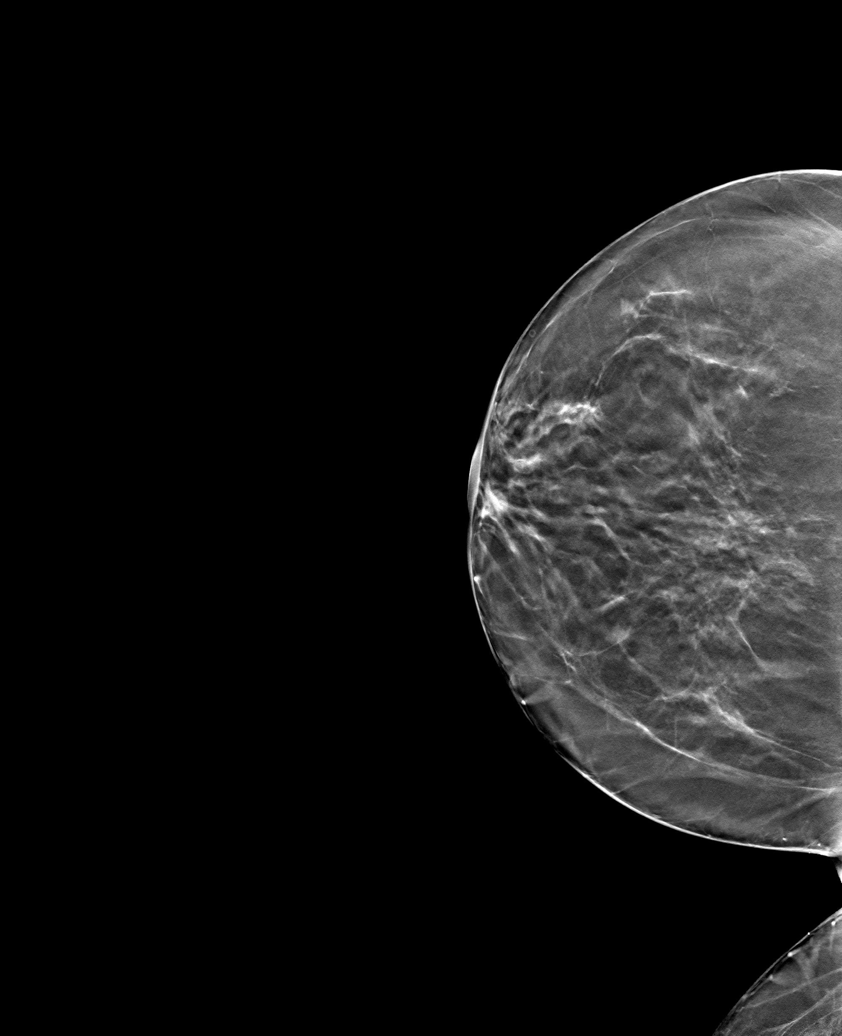

[6 of 30 positions shown; findings below may reference images not displayed]

ACR Breast Density Category b: There are scattered areas of
fibroglandular density.
FINDINGS: There are no findings suspicious for malignancy.
IMPRESSION: No mammographic evidence of malignancy. A result letter of this
screening mammogram will be mailed directly to the patient.

RECOMMENDATION:
Screening mammogram in one year. (Code:51-O-LD2)

BI-RADS CATEGORY  1: Negative.

## 2022-03-06 ENCOUNTER — Other Ambulatory Visit: Payer: Self-pay | Admitting: Internal Medicine

## 2022-03-06 DIAGNOSIS — Z1231 Encounter for screening mammogram for malignant neoplasm of breast: Secondary | ICD-10-CM

## 2022-04-27 ENCOUNTER — Ambulatory Visit
Admission: RE | Admit: 2022-04-27 | Discharge: 2022-04-27 | Disposition: A | Discharge status: Home / Self Care | Payer: BC Managed Care – PPO | Source: Ambulatory Visit | Attending: Internal Medicine | Admitting: Internal Medicine

## 2022-04-27 DIAGNOSIS — Z1231 Encounter for screening mammogram for malignant neoplasm of breast: Secondary | ICD-10-CM | POA: Diagnosis not present

## 2022-07-02 ENCOUNTER — Ambulatory Visit
Admission: RE | Admit: 2022-07-02 | Discharge: 2022-07-02 | Disposition: A | Discharge status: Home / Self Care | Payer: 59 | Source: Ambulatory Visit | Attending: Internal Medicine | Admitting: Internal Medicine

## 2022-07-02 ENCOUNTER — Other Ambulatory Visit: Payer: Self-pay | Admitting: Internal Medicine

## 2022-07-02 DIAGNOSIS — M549 Dorsalgia, unspecified: Secondary | ICD-10-CM

## 2022-08-13 ENCOUNTER — Encounter: Payer: Self-pay | Admitting: Physician Assistant

## 2022-08-13 ENCOUNTER — Ambulatory Visit: Payer: 59 | Attending: Physician Assistant | Admitting: Physician Assistant

## 2022-08-13 VITALS — BP 136/80 | HR 64 | Ht 63.0 in | Wt 207.2 lb

## 2022-08-13 DIAGNOSIS — I4711 Inappropriate sinus tachycardia, so stated: Secondary | ICD-10-CM

## 2022-08-13 DIAGNOSIS — E785 Hyperlipidemia, unspecified: Secondary | ICD-10-CM | POA: Diagnosis not present

## 2022-08-13 DIAGNOSIS — I10 Essential (primary) hypertension: Secondary | ICD-10-CM | POA: Diagnosis not present

## 2022-08-13 DIAGNOSIS — Z6835 Body mass index (BMI) 35.0-35.9, adult: Secondary | ICD-10-CM

## 2022-08-13 MED ORDER — OLMESARTAN MEDOXOMIL 5 MG PO TABS
10.0000 mg | ORAL_TABLET | Freq: Every day | ORAL | 3 refills | Status: AC
Start: 2022-08-13 — End: ?

## 2022-08-13 MED ORDER — ATENOLOL 50 MG PO TABS: ORAL_TABLET | ORAL | 2 refills | Status: AC

## 2022-08-13 NOTE — Progress Notes (Signed)
Office Visit    Patient Name: Madison Delgado Date of Encounter: 08/13/2022  PCP:  Wenda Low, MD   [place]  Cardiologist:  Larae Grooms, MD  Advanced Practice Provider:  No care team member to display Electrophysiologist:  None    Chief Complaint    Madison Delgado is a 62 y.o. female with past medical history of inappropriate sinus tachycardia diagnosed at [facility] and treated with beta-blocker, normal LV function on 2D echocardiogram 2013, hypertension, hyperlipidemia, obesity presents today for follow appointment.  She does have palpitations and takes the atenolol daily. She takes 50mg  and feels good about this dose. She does not want to increase her dose today. She wants to loose her weight and is  feeling the difference of her age. . She can still walk and climb stairs okay. We discussed a referral to healthy weight and wellness. She does have a membership to [employer]. She wants to loose about 20-30 lbs so she feels better.   Reports no shortness of breath nor dyspnea on exertion. Reports no chest pain, pressure, or tightness. No edema, orthopnea, PND.   Past Medical History    Past Medical History:  Diagnosis Date   Anxiety    Fibroids 04/22/2013   History of stress test    a. ETT 5/17: normal   Hyperlipidemia    Hypertension    Joint pain    Obesity    Palpitations    Postmenopausal bleeding 04/22/2013   Prediabetes    S/P hysterectomy 04/22/2013   Seasonal and perennial allergic rhinitis 03/18/2017   SOBOE (shortness of breath on exertion)    Past Surgical History:  Procedure Laterality Date   ADENOIDECTOMY     BILATERAL SALPINGECTOMY Bilateral 04/22/2013   Procedure: BILATERAL SALPINGECTOMY;  Surgeon: Elveria Royals, MD;  Location: [facility];  Service: Gynecology;  Laterality: Bilateral;   CARPAL TUNNEL RELEASE     CATARACT EXTRACTION  04/2016   ROBOTIC ASSISTED TOTAL HYSTERECTOMY N/A  04/22/2013   Procedure: ROBOTIC ASSISTED TOTAL HYSTERECTOMY;  Surgeon: Elveria Royals, MD;  Location: [facility];  Service: Gynecology;  Laterality: N/A;  3 1/2 hrs.   TONSILLECTOMY     WISDOM TOOTH EXTRACTION      Allergies  Allergies  Allergen Reactions   Pork Allergy Shortness Of Breath and Swelling    Certain types of pork products cause lips to swell and SOB   Duloxetine Hcl Other (See Comments)   Other     Other reaction(s): intol Zoloft, lexapro     EKGs/Labs/Other Studies Reviewed:   The following studies were reviewed today:  Echo 05/05/18 Study Conclusions   - Left ventricle: The cavity size was normal. Systolic function was    normal. The estimated ejection fraction was in the range of 60%    to 65%. Wall motion was normal; there were no regional wall    motion abnormalities. Left ventricular diastolic function    parameters were normal.  - Aortic valve: There was no regurgitation.  - Mitral valve: Transvalvular velocity was within the normal range.    There was no evidence for stenosis. There was trivial    regurgitation.  - Right ventricle: The cavity size was normal. Wall thickness was    normal. Systolic function was normal.  - Atrial septum: No defect or patent foramen ovale was identified.  - Tricuspid valve: There was trivial regurgitation.  - Pulmonary arteries: Systolic pressure was mildly increased.  PA    peak pressure: 41 mm Hg (S).   -------------------------------------------------------------------  Study data:  Comparison was made to the study of 10/30/2011.  Study  status:  Routine.  Procedure:  Transthoracic echocardiography.  Image quality was adequate.          Transthoracic  echocardiography.  M-mode, complete 2D, spectral Doppler, and color  Doppler.  Birthdate:  Patient birthdate: 09-26-1960.  Age:  Patient  is 62 yr old.  Sex:  Gender: female.    BMI: 29.2 kg/m^2.  Blood  pressure:     122/73  Patient status:  Outpatient.  Study date:   Study date: 05/05/2018. Study time: 07:40 AM.  Location:  [facility]   -------------------------------------------------------------------   -------------------------------------------------------------------  Left ventricle:  The cavity size was normal. Systolic function was  normal. The estimated ejection fraction was in the range of 60% to  65%. Wall motion was normal; there were no regional wall motion  abnormalities. The transmitral flow pattern was normal. The  deceleration time of the early transmitral flow velocity was  normal. The pulmonary vein flow pattern was normal. The tissue  Doppler parameters were normal. Left ventricular diastolic function  parameters were normal.   -------------------------------------------------------------------  Aortic valve:   Trileaflet; normal thickness leaflets. Mobility was  not restricted.  Doppler:  Transvalvular velocity was within the  normal range. There was no stenosis. There was no regurgitation.    -------------------------------------------------------------------  Aorta: Aortic root: The aortic root was normal in size.   -------------------------------------------------------------------  Mitral valve:   Structurally normal valve.   Mobility was not  restricted.  Doppler:  Transvalvular velocity was within the normal  range. There was no evidence for stenosis. There was trivial  regurgitation.    Peak gradient (D): 3 mm Hg.   -------------------------------------------------------------------  Left atrium:  The atrium was normal in size.   -------------------------------------------------------------------  Atrial septum:  No defect or patent foramen ovale was identified.    -------------------------------------------------------------------  Right ventricle:  The cavity size was normal. Wall thickness was  normal. Systolic function was normal.   -------------------------------------------------------------------   Pulmonic valve:    Structurally normal valve.   Cusp separation was  normal.  Doppler:  Transvalvular velocity was within the normal  range. There was no evidence for stenosis. There was no  regurgitation.   -------------------------------------------------------------------  Tricuspid valve:   Structurally normal valve.    Doppler:  Transvalvular velocity was within the normal range. There was  trivial regurgitation.   -------------------------------------------------------------------  Pulmonary artery:   The main pulmonary artery was normal-sized.  Systolic pressure was mildly increased.   -------------------------------------------------------------------  Right atrium:  The atrium was normal in size.   -------------------------------------------------------------------  Pericardium: There was no pericardial effusion.   -------------------------------------------------------------------  Systemic veins:  Inferior vena cava: The vessel was normal in size. The  respirophasic diameter changes were in the normal range (>= 50%),  consistent with normal central venous pressure.    EKG:  EKG is ordered today.  The ekg ordered today demonstrates NSR rate 64 bpm.  Recent Labs: No results found for requested labs within last 365 days.  Recent Lipid Panel    Component Value Date/Time   CHOL 163 08/03/2020 0852   TRIG 103 08/03/2020 0852   HDL 54 08/03/2020 0852   CHOLHDL 3.0 08/03/2020 0852   LDLCALC 90 08/03/2020 0852    Home Medications   Current Meds  Medication Sig   ALPRAZolam (XANAX) 0.5 MG tablet Take 1  tablet (0.5 mg total) by mouth 2 (two) times daily as needed for anxiety.   atenolol (TENORMIN) 50 MG tablet Take 1 tablet by mouth daily, may take additioinal 1/2 tablet daily as needed for palpitations   Desvenlafaxine Succinate ER 25 MG TB24 Take 25 mg by mouth daily.   MAGNESIUM PO Take 1 tablet by mouth daily with breakfast. 250 mg   Multiple Vitamins-Calcium  (ONE-A-DAY WOMENS FORMULA PO) Take 1 tablet by mouth daily with breakfast.   olmesartan (BENICAR) 5 MG tablet Take 2 tablets (10 mg total) by mouth daily.   vitamin B-12 (CYANOCOBALAMIN) 250 MCG tablet Take 250 mcg by mouth daily.   Vitamin D, Ergocalciferol, (DRISDOL) 1.25 MG (50000 UNIT) CAPS capsule Take 1 capsule (50,000 Units total) by mouth every 7 (seven) days.     Review of Systems      All other systems reviewed and are otherwise negative except as noted above.  Physical Exam    VS:  BP 136/80   Pulse 64   Ht 5\' 3"  (1.6 m)   Wt 207 lb 3.2 oz (94 kg)   SpO2 97%   BMI 36.70 kg/m  , BMI Body mass index is 36.7 kg/m.  Wt Readings from Last 3 Encounters:  08/13/22 207 lb 3.2 oz (94 kg)  08/09/21 198 lb (89.8 kg)  08/01/21 196 lb (88.9 kg)     GEN: Well nourished, well developed, in no acute distress. HEENT: normal. Neck: Supple, no JVD, carotid bruits, or masses. Cardiac: RRR, no murmurs, rubs, or gallops. No clubbing, cyanosis, edema.  Radials/PT 2+ and equal bilaterally.  Respiratory:  Respirations regular and unlabored, clear to auscultation bilaterally. GI: Soft, nontender, nondistended. MS: No deformity or atrophy. Skin: Warm and dry, no rash. Neuro:  Strength and sensation are intact. Psych: Normal affect.  Assessment & Plan    Palpitations -improved on the atenolol but still happening. She does not want a dose increase today -she has not need the PRN 1/2 tab -continue current medications -maintain hydration and limit caffeine  Hypertension -BP well controlled today -continue current medication regimen with Benicar and atenolol -continue to monitor at home  Leg swelling -much improved today -continue current medications and try to limit sodium  Hyperlipidemia -recent lipid panel with LDL 71, triglycerides 158  Sleep apnea? -discussed an at home sleep study and she would like to think about this today -we discussed the risks of untreated sleep  apnea         Disposition: Follow up 1 year with Larae Grooms, MD or APP.  Signed, Elgie Collard, PA-C 08/13/2022, 3:45 PM [facility]

## 2022-08-13 NOTE — Patient Instructions (Signed)
Medication Instructions:  Your physician recommends that you continue on your current medications as directed. Please refer to the Current Medication list given to you today.  *If you need a refill on your cardiac medications before your next appointment, please call your pharmacy*   Lab Work: NONE If you have labs (blood work) drawn today and your tests are completely normal, you will receive your results only by: [place] (if you have MyChart) OR A paper copy in the mail If you have any lab test that is abnormal or we need to change your treatment, we will call you to review the results.   Testing/Procedures: PLEASE CALL OUR OFFICE IF YOU WOULD LIKE TO DO THE SLEEP STUDY DICUSSED TODAY   Follow-Up: At [facility], you and your health needs are our priority.  As part of our continuing mission to provide you with exceptional heart care, we have created designated Provider Care Teams.  These Care Teams include your primary Cardiologist (physician) and Advanced Practice Providers (APPs -  Physician Assistants and Nurse Practitioners) who all work together to provide you with the care you need, when you need it.  We recommend signing up for the patient portal called "MyChart".  Sign up information is provided on this After Visit Summary.  MyChart is used to connect with patients for Virtual Visits (Telemedicine).  Patients are able to view lab/test results, encounter notes, upcoming appointments, etc.  Non-urgent messages can be sent to your provider as well.   To learn more about what you can do with MyChart, go to [email or web address].    Your next appointment:   1 year(s)  Provider:   Larae Grooms, MD or Nicholes Rough, PA-C  YOU HAVE BEEN REFERRED TO [facility]. THEY WILL CALL YOU TO SCHEDULE AN APPOINTMENT.

## 2022-12-12 ENCOUNTER — Encounter (INDEPENDENT_AMBULATORY_CARE_PROVIDER_SITE_OTHER): Payer: BC Managed Care – PPO | Admitting: Family Medicine

## 2022-12-13 ENCOUNTER — Encounter (INDEPENDENT_AMBULATORY_CARE_PROVIDER_SITE_OTHER): Payer: BC Managed Care – PPO | Admitting: Family Medicine

## 2023-03-21 ENCOUNTER — Encounter: Payer: Self-pay | Admitting: Cardiovascular Disease

## 2023-03-21 NOTE — Progress Notes (Signed)
  Cardiology Office Note:  .   Date:  03/22/2023  ID:  [name], DOB 10-28-1960, MRN [id number] PCP: Georgann Housekeeper, MD  [facility] Providers Cardiologist:  Lance Muss, MD    History of Present Illness: . Nov. 8, 2024    Madison Delgado is a 62 y.o. female with hx of sinus tachycardia and palpitations Previous patient of Dr. Eldridge Dace I am meeting her for the first time today   Still has palpitations Worse with stress , anxiety  No cp ,   Gets some exercise , walks her dogs .   Has some symptoms of OSA  She is scared to do a sleep study .     ROS:   Studies Reviewed: .         Risk Assessment/Calculations:             Physical Exam:   VS:  BP 120/82   Pulse 68   Ht 5\' 3"  (1.6 m)   Wt 197 lb (89.4 kg)   BMI 34.90 kg/m    Wt Readings from Last 3 Encounters:  03/22/23 197 lb (89.4 kg)  08/13/22 207 lb 3.2 oz (94 kg)  08/09/21 198 lb (89.8 kg)    GEN: Well nourished, well developed in no acute distress NECK: No JVD; No carotid bruits CARDIAC: RRR, no murmurs, rubs, gallops RESPIRATORY:  Clear to auscultation without rales, wheezing or rhonchi  ABDOMEN: Soft, non-tender, non-distended EXTREMITIES:  No edema; No deformity   ASSESSMENT AND PLAN: .     Palpitations:   better with atenolol  2.  Probable OSA:   she has symptoms of OSA but does not want to be tested.   3.  Obesity:   advised weight loss, more exercise             Dispo: 1 year with Dr. Mayford Knife    Signed, Kristeen Miss, MD

## 2023-03-22 ENCOUNTER — Encounter: Payer: Self-pay | Admitting: Cardiovascular Disease

## 2023-03-22 ENCOUNTER — Ambulatory Visit
Payer: No Typology Code available for payment source | Attending: Cardiovascular Disease | Admitting: Cardiovascular Disease

## 2023-03-22 VITALS — BP 120/82 | HR 68 | Ht 63.0 in | Wt 197.0 lb

## 2023-03-22 DIAGNOSIS — I4711 Inappropriate sinus tachycardia, so stated: Secondary | ICD-10-CM | POA: Diagnosis not present

## 2023-03-22 DIAGNOSIS — I10 Essential (primary) hypertension: Secondary | ICD-10-CM | POA: Diagnosis not present

## 2023-03-22 NOTE — Patient Instructions (Signed)
Medication Instructions:  Your physician recommends that you continue on your current medications as directed. Please refer to the Current Medication list given to you today.  *If you need a refill on your cardiac medications before your next appointment, please call your pharmacy*  Follow-Up: At [facility], you and your health needs are our priority.  As part of our continuing mission to provide you with exceptional heart care, we have created designated Provider Care Teams.  These Care Teams include your primary Cardiologist (physician) and Advanced Practice Providers (APPs -  Physician Assistants and Nurse Practitioners) who all work together to provide you with the care you need, when you need it.  We recommend signing up for the patient portal called "MyChart".  Sign up information is provided on this After Visit Summary.  MyChart is used to connect with patients for Virtual Visits (Telemedicine).  Patients are able to view lab/test results, encounter notes, upcoming appointments, etc.  Non-urgent messages can be sent to your provider as well.   To learn more about what you can do with MyChart, go to [email or web address].    Your next appointment:   1 year(s)  Provider:   Dr. Turner  

## 2023-04-23 ENCOUNTER — Emergency Department (HOSPITAL_COMMUNITY)
Admission: EM | Admit: 2023-04-23 | Discharge: 2023-04-23 | Disposition: A | Discharge status: Home / Self Care | Payer: No Typology Code available for payment source

## 2023-04-23 ENCOUNTER — Encounter (HOSPITAL_COMMUNITY): Payer: Self-pay

## 2023-04-23 ENCOUNTER — Emergency Department (HOSPITAL_COMMUNITY): Payer: No Typology Code available for payment source

## 2023-04-23 ENCOUNTER — Other Ambulatory Visit: Payer: Self-pay

## 2023-04-23 DIAGNOSIS — I10 Essential (primary) hypertension: Secondary | ICD-10-CM | POA: Diagnosis not present

## 2023-04-23 DIAGNOSIS — Y92410 Unspecified street and highway as the place of occurrence of the external cause: Secondary | ICD-10-CM | POA: Insufficient documentation

## 2023-04-23 DIAGNOSIS — S20219A Contusion of unspecified front wall of thorax, initial encounter: Secondary | ICD-10-CM | POA: Insufficient documentation

## 2023-04-23 DIAGNOSIS — Z79899 Other long term (current) drug therapy: Secondary | ICD-10-CM | POA: Diagnosis not present

## 2023-04-23 DIAGNOSIS — M542 Cervicalgia: Secondary | ICD-10-CM | POA: Diagnosis present

## 2023-04-23 DIAGNOSIS — S161XXA Strain of muscle, fascia and tendon at neck level, initial encounter: Secondary | ICD-10-CM | POA: Insufficient documentation

## 2023-04-23 LAB — BASIC METABOLIC PANEL
Anion gap: 11 (ref 5–15)
BUN: 18 mg/dL (ref 8–23)
CO2: 22 mmol/L (ref 22–32)
Calcium: 8.8 mg/dL — ABNORMAL LOW (ref 8.9–10.3)
Chloride: 105 mmol/L (ref 98–111)
Creatinine, Ser: 0.91 mg/dL (ref 0.44–1.00)
GFR, Estimated: >60 mL/min (ref >60)
Glucose, Bld: 110 mg/dL — ABNORMAL HIGH (ref 70–99)
Potassium: 3.2 mmol/L — ABNORMAL LOW (ref 3.5–5.1)
Sodium: 138 mmol/L (ref 135–145)

## 2023-04-23 LAB — CBC
HCT: 40.8 % (ref 36.0–46.0)
Hemoglobin: 13 g/dL (ref 12.0–15.0)
MCH: 27.7 pg (ref 26.0–34.0)
MCHC: 31.9 g/dL (ref 30.0–36.0)
MCV: 87 fL (ref 80.0–100.0)
Platelets: 299 10*3/uL (ref 150–400)
RBC: 4.69 MIL/uL (ref 3.87–5.11)
RDW: 14.6 % (ref 11.5–15.5)
WBC: 6.5 10*3/uL (ref 4.0–10.5)
nRBC: 0 % (ref 0.0–0.2)

## 2023-04-23 LAB — TROPONIN I (HIGH SENSITIVITY): Troponin I (High Sensitivity): 10 ng/L (ref <18)

## 2023-04-23 MED ORDER — KETOROLAC TROMETHAMINE 10 MG PO TABS: 10.0000 mg | ORAL_TABLET | Freq: Four times a day (QID) | ORAL | 0 refills | Status: AC | PRN

## 2023-04-23 MED ORDER — OXYCODONE-ACETAMINOPHEN 5-325 MG PO TABS
1.0000 | ORAL_TABLET | Freq: Once | ORAL | Status: AC
  Administered 2023-04-23: 1 via ORAL
  Filled 2023-04-23: qty 1

## 2023-04-23 MED ORDER — ONDANSETRON HCL 4 MG/2ML IJ SOLN: 4.0000 mg | Freq: Once | INTRAMUSCULAR | Status: DC

## 2023-04-23 MED ORDER — MORPHINE SULFATE (PF) 4 MG/ML IV SOLN: 4.0000 mg | Freq: Once | INTRAVENOUS | Status: DC

## 2023-04-23 MED ORDER — KETOROLAC TROMETHAMINE 15 MG/ML IJ SOLN
15.0000 mg | Freq: Once | INTRAMUSCULAR | Status: AC
Start: 2023-04-23 — End: 2023-04-23
  Administered 2023-04-23: 15 mg via INTRAMUSCULAR
  Filled 2023-04-23: qty 1

## 2023-04-23 MED ORDER — OXYCODONE-ACETAMINOPHEN 5-325 MG PO TABS
1.0000 | ORAL_TABLET | Freq: Four times a day (QID) | ORAL | 0 refills | Status: AC | PRN
Start: 2023-04-23 — End: ?
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
## 2023-04-23 NOTE — Discharge Instructions (Signed)
Your workup today was reassuring.  Please take the ketorolac as needed for pain.  If you are still having pain is okay to take the Percocet.  Do not drive or drink alcohol while taking the Percocet as may make you drowsy.  Please follow-up with your doctor for reevaluation.  Return to the ER for worsening symptoms.

## 2023-04-30 ENCOUNTER — Other Ambulatory Visit: Payer: Self-pay | Admitting: Internal Medicine

## 2023-04-30 DIAGNOSIS — Z1231 Encounter for screening mammogram for malignant neoplasm of breast: Secondary | ICD-10-CM

## 2023-05-23 ENCOUNTER — Ambulatory Visit
Admission: RE | Admit: 2023-05-23 | Discharge: 2023-05-23 | Disposition: A | Discharge status: Home / Self Care | Payer: Managed Care, Other (non HMO) | Source: Ambulatory Visit | Attending: Internal Medicine | Admitting: Internal Medicine

## 2023-05-23 DIAGNOSIS — Z1231 Encounter for screening mammogram for malignant neoplasm of breast: Secondary | ICD-10-CM

## 2023-06-13 ENCOUNTER — Ambulatory Visit: Payer: Managed Care, Other (non HMO)

## 2023-06-13 ENCOUNTER — Ambulatory Visit: Payer: Managed Care, Other (non HMO) | Admitting: Podiatry

## 2023-06-13 ENCOUNTER — Encounter: Payer: Self-pay | Admitting: Podiatry

## 2023-06-13 ENCOUNTER — Ambulatory Visit (INDEPENDENT_AMBULATORY_CARE_PROVIDER_SITE_OTHER): Payer: Managed Care, Other (non HMO)

## 2023-06-13 DIAGNOSIS — M779 Enthesopathy, unspecified: Secondary | ICD-10-CM | POA: Diagnosis not present

## 2023-06-13 DIAGNOSIS — M7751 Other enthesopathy of right foot: Secondary | ICD-10-CM | POA: Diagnosis not present

## 2023-06-13 DIAGNOSIS — M7752 Other enthesopathy of left foot: Secondary | ICD-10-CM | POA: Diagnosis not present

## 2023-06-13 MED ORDER — TRIAMCINOLONE ACETONIDE 10 MG/ML IJ SUSP
10.0000 mg | Freq: Once | INTRAMUSCULAR | Status: AC
  Administered 2023-06-13: 10 mg via INTRA_ARTICULAR

## 2023-06-13 NOTE — Progress Notes (Signed)
Subjective:   Patient ID: Madison Delgado, female   DOB: 63 y.o.   MRN: [id number]   HPI Patient presents stating that she has over the last few months developed a lot of pain again left foot over right foot with inflammation of the joint surface and states it has been very sore and hard for her to walk on   ROS      Objective:  Physical Exam  Neurovascular status intact inflammation pain of the third MPJ left over right fluid buildup     Assessment:  Capsulitis third MPJ left over right painful when pressed     Plan:  H&P reviewed and at this point for the left I anesthetized the forefoot 60 mg like Marcaine mixture aspirated the third MPJ getting out a small amount of clear fluid injected quarter cc dexamethasone Kenalog and then went ahead and for the right did periarticular around the third MPJ 3 mg Dexasone Kenalog 5 mg Xylocaine applied sterile dressings bilateral discussed the possibility for shortening osteotomies and discussed also it appears there is some arthritis of the metatarsals but she has no history and no other symptoms so we will just keep an eye on it and I went ahead today and I reviewed that with her.  X-rays do indicate possibility for moderate elongation of the second and third MPJs bilateral with some osteoporosis possible arthritis of the joint surfaces

## 2023-06-19 ENCOUNTER — Other Ambulatory Visit: Payer: Self-pay | Admitting: Orthopedic Surgery

## 2023-06-19 DIAGNOSIS — M542 Cervicalgia: Secondary | ICD-10-CM

## 2023-07-26 ENCOUNTER — Ambulatory Visit
Admission: RE | Admit: 2023-07-26 | Discharge: 2023-07-26 | Disposition: A | Discharge status: Home / Self Care | Payer: Managed Care, Other (non HMO) | Source: Ambulatory Visit | Attending: Orthopedic Surgery | Admitting: Orthopedic Surgery

## 2023-07-26 DIAGNOSIS — M542 Cervicalgia: Secondary | ICD-10-CM

## 2023-08-12 LAB — GLUCOSE, POCT (MANUAL RESULT ENTRY): POC Glucose: 114 mg/dL — AB (ref 70–99)

## 2023-08-12 NOTE — Congregational Nurse Program (Signed)
 Seen at [facility] for employee health screening.  Has appt scheduled with pcp, [facility]. Discussed diet and exercise.  Will followup with BP check in a month. Juliann Pulse, RN, Congregational Nurse, [phone].

## 2024-04-17 ENCOUNTER — Other Ambulatory Visit: Payer: Self-pay | Admitting: Internal Medicine

## 2024-04-17 DIAGNOSIS — Z1231 Encounter for screening mammogram for malignant neoplasm of breast: Secondary | ICD-10-CM

## 2024-04-20 ENCOUNTER — Ambulatory Visit (INDEPENDENT_AMBULATORY_CARE_PROVIDER_SITE_OTHER)

## 2024-04-20 ENCOUNTER — Ambulatory Visit: Admitting: Podiatry

## 2024-04-20 DIAGNOSIS — M7752 Other enthesopathy of left foot: Secondary | ICD-10-CM

## 2024-04-20 DIAGNOSIS — M7751 Other enthesopathy of right foot: Secondary | ICD-10-CM

## 2024-04-20 DIAGNOSIS — G629 Polyneuropathy, unspecified: Secondary | ICD-10-CM

## 2024-04-20 MED ORDER — GABAPENTIN 400 MG PO CAPS: 400.0000 mg | ORAL_CAPSULE | Freq: Three times a day (TID) | ORAL | 3 refills | Status: AC

## 2024-04-22 NOTE — Progress Notes (Signed)
 Subjective:   Patient ID: Madison Delgado, female   DOB: 63 y.o.   MRN: [id number]   HPI Patient presents stating she is getting pain in both feet and the toes and states that the medication that we have used gave her temporary relief of her symptoms   ROS      Objective:  Physical Exam  Neurovascular status intact with patient's forefoot pain present bilateral very difficult to differentiate inflammatory from neuropathic but states the injections we use gave her temporary relief but for the most part reoccurrence has occurred     Assessment:  Very difficult to separate the possibility for inflammatory condition versus neuropathic condition bilateral forefoot     Plan:  H&P reviewed I do think ultimately shortening osteotomies may be necessary but at this point the pain is so diffuse and the way it is presenting we are gena go ahead and try gabapentin  and see the results with this before we would consider more aggressive treatment plan.  I did write for gabapentin  giving her instructions on how to use it and I want to see her back again in the next several months and we will start her at night and she could have morning and day if needed.  All questions answered today  X-rays dated today did indicate that there is elongation of the metatarsals 2 3 bilateral but I did not see changes did not see indications of arthritic condition or stress fracture

## 2024-05-29 ENCOUNTER — Ambulatory Visit

## 2024-06-12 ENCOUNTER — Ambulatory Visit
Admission: RE | Admit: 2024-06-12 | Discharge: 2024-06-12 | Disposition: A | Source: Ambulatory Visit | Attending: Internal Medicine | Admitting: Internal Medicine

## 2024-06-12 DIAGNOSIS — Z1231 Encounter for screening mammogram for malignant neoplasm of breast: Secondary | ICD-10-CM

## 2024-06-15 ENCOUNTER — Ambulatory Visit: Admitting: Podiatry

## 2024-06-23 ENCOUNTER — Ambulatory Visit: Admitting: Student in an Organized Health Care Education/Training Program
